# Patient Record
Sex: Male | Born: 1945 | Race: Black or African American | Hispanic: No | Smoking: Former smoker
Health system: Southern US, Community
[De-identification: ages and names within clinical notes are randomized; demographics above are authoritative.]

## PROBLEM LIST (undated history)

## (undated) DIAGNOSIS — F329 Major depressive disorder, single episode, unspecified: Secondary | ICD-10-CM

## (undated) DIAGNOSIS — E785 Hyperlipidemia, unspecified: Secondary | ICD-10-CM

## (undated) DIAGNOSIS — E119 Type 2 diabetes mellitus without complications: Secondary | ICD-10-CM

## (undated) DIAGNOSIS — J449 Chronic obstructive pulmonary disease, unspecified: Secondary | ICD-10-CM

## (undated) DIAGNOSIS — I4891 Unspecified atrial fibrillation: Secondary | ICD-10-CM

## (undated) DIAGNOSIS — F32A Depression, unspecified: Secondary | ICD-10-CM

## (undated) DIAGNOSIS — J189 Pneumonia, unspecified organism: Secondary | ICD-10-CM

## (undated) HISTORY — PX: BACK SURGERY: SHX140

---
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
## 2018-11-10 ENCOUNTER — Other Ambulatory Visit (HOSPITAL_COMMUNITY): Payer: Medicare Other

## 2018-11-10 ENCOUNTER — Inpatient Hospital Stay
Admission: AD | Admit: 2018-11-10 | Discharge: 2018-11-29 | Disposition: A | Discharge status: Skilled Nursing Facility | Payer: Medicare Other | Source: Other Acute Inpatient Hospital | Attending: Internal Medicine | Admitting: Internal Medicine

## 2018-11-10 DIAGNOSIS — J189 Pneumonia, unspecified organism: Secondary | ICD-10-CM

## 2018-11-10 DIAGNOSIS — J90 Pleural effusion, not elsewhere classified: Secondary | ICD-10-CM

## 2018-11-10 DIAGNOSIS — R0602 Shortness of breath: Secondary | ICD-10-CM

## 2018-11-10 DIAGNOSIS — R0902 Hypoxemia: Secondary | ICD-10-CM

## 2018-11-10 MED ORDER — SERTRALINE HCL 50 MG PO TABS
50.00 | ORAL_TABLET | ORAL | Status: DC
Start: 2018-11-11 — End: 2018-11-10

## 2018-11-10 MED ORDER — GENERIC EXTERNAL MEDICATION
12.00 | Status: DC
Start: ? — End: 2018-11-10

## 2018-11-10 MED ORDER — DOCUSATE SODIUM 100 MG PO CAPS
100.00 | ORAL_CAPSULE | ORAL | Status: DC
Start: ? — End: 2018-11-10

## 2018-11-10 MED ORDER — ONDANSETRON HCL 4 MG/2ML IJ SOLN
4.00 | INTRAMUSCULAR | Status: DC
Start: ? — End: 2018-11-10

## 2018-11-10 MED ORDER — PREDNISONE 20 MG PO TABS
40.00 | ORAL_TABLET | ORAL | Status: DC
Start: 2018-11-11 — End: 2018-11-10

## 2018-11-10 MED ORDER — MAGNESIUM HYDROXIDE 400 MG/5ML PO SUSP
30.00 | ORAL | Status: DC
Start: ? — End: 2018-11-10

## 2018-11-10 MED ORDER — LEVOFLOXACIN 750 MG PO TABS
750.00 | ORAL_TABLET | ORAL | Status: DC
Start: 2018-11-10 — End: 2018-11-10

## 2018-11-10 MED ORDER — WHITE PETROLEUM JELLY EX GEL
1.00 | CUTANEOUS | Status: DC
Start: ? — End: 2018-11-10

## 2018-11-10 MED ORDER — BISACODYL 10 MG RE SUPP
10.00 | RECTAL | Status: DC
Start: ? — End: 2018-11-10

## 2018-11-10 MED ORDER — DILTIAZEM HCL ER BEADS 180 MG PO CP24
180.00 | ORAL_CAPSULE | ORAL | Status: DC
Start: 2018-11-11 — End: 2018-11-10

## 2018-11-10 MED ORDER — DEXTROSE 50 % IV SOLN
50.00 | INTRAVENOUS | Status: DC
Start: ? — End: 2018-11-10

## 2018-11-10 MED ORDER — GLUCOSE 40 % PO GEL
ORAL | Status: DC
Start: ? — End: 2018-11-10

## 2018-11-10 MED ORDER — IPRATROPIUM-ALBUTEROL 0.5-2.5 (3) MG/3ML IN SOLN
3.00 | RESPIRATORY_TRACT | Status: DC
Start: 2018-11-10 — End: 2018-11-10

## 2018-11-10 MED ORDER — GLUCAGON HCL (DIAGNOSTIC) 1 MG IJ SOLR
1.00 | INTRAMUSCULAR | Status: DC
Start: ? — End: 2018-11-10

## 2018-11-10 MED ORDER — GUAIFENESIN ER 600 MG PO TB12
1200.00 | ORAL_TABLET | ORAL | Status: DC
Start: 2018-11-10 — End: 2018-11-10

## 2018-11-10 MED ORDER — APIXABAN 5 MG PO TABS
5.00 | ORAL_TABLET | ORAL | Status: DC
Start: 2018-11-10 — End: 2018-11-10

## 2018-11-10 MED ORDER — INSULIN GLARGINE 100 UNIT/ML ~~LOC~~ SOLN
30.00 | SUBCUTANEOUS | Status: DC
Start: 2018-11-10 — End: 2018-11-10

## 2018-11-10 MED ORDER — INSULIN LISPRO 100 UNIT/ML ~~LOC~~ SOLN
0.00 | SUBCUTANEOUS | Status: DC
Start: 2018-11-10 — End: 2018-11-10

## 2018-11-10 MED ORDER — SODIUM CHLORIDE 3 % IN NEBU
4.00 | INHALATION_SOLUTION | RESPIRATORY_TRACT | Status: DC
Start: 2018-11-10 — End: 2018-11-10

## 2018-11-10 MED ORDER — HYDROXYZINE HCL 25 MG PO TABS
25.00 | ORAL_TABLET | ORAL | Status: DC
Start: ? — End: 2018-11-10

## 2018-11-10 MED ORDER — ATORVASTATIN CALCIUM 40 MG PO TABS
40.00 | ORAL_TABLET | ORAL | Status: DC
Start: 2018-11-10 — End: 2018-11-10

## 2018-11-10 MED ORDER — ACETAMINOPHEN 325 MG PO TABS
650.00 | ORAL_TABLET | ORAL | Status: DC
Start: ? — End: 2018-11-10

## 2018-11-10 MED ORDER — HYDRALAZINE HCL 20 MG/ML IJ SOLN
10.00 | INTRAMUSCULAR | Status: DC
Start: ? — End: 2018-11-10

## 2018-11-10 MED ORDER — ALBUTEROL SULFATE (2.5 MG/3ML) 0.083% IN NEBU
2.50 | INHALATION_SOLUTION | RESPIRATORY_TRACT | Status: DC
Start: ? — End: 2018-11-10

## 2018-11-10 MED ORDER — PANTOPRAZOLE SODIUM 40 MG PO TBEC
40.00 | DELAYED_RELEASE_TABLET | ORAL | Status: DC
Start: 2018-11-11 — End: 2018-11-10

## 2018-11-10 MED ORDER — SALINE NASAL SPRAY 0.65 % NA SOLN
1.00 | NASAL | Status: DC
Start: ? — End: 2018-11-10

## 2018-11-10 MED ORDER — NITROGLYCERIN 0.4 MG SL SUBL
.40 | SUBLINGUAL_TABLET | SUBLINGUAL | Status: DC
Start: ? — End: 2018-11-10

## 2018-11-11 LAB — CBC WITH DIFFERENTIAL/PLATELET
Abs Immature Granulocytes: 0 10*3/uL (ref 0.00–0.07)
Basophils Absolute: 0 10*3/uL (ref 0.0–0.1)
Basophils Relative: 0 %
Eosinophils Absolute: 0 10*3/uL (ref 0.0–0.5)
Eosinophils Relative: 0 %
HCT: 31.7 % — ABNORMAL LOW (ref 39.0–52.0)
Hemoglobin: 9 g/dL — ABNORMAL LOW (ref 13.0–17.0)
LYMPHS ABS: 1.3 10*3/uL (ref 0.7–4.0)
Lymphocytes Relative: 16 %
MCH: 28.5 pg (ref 26.0–34.0)
MCHC: 28.4 g/dL — ABNORMAL LOW (ref 30.0–36.0)
MCV: 100.3 fL — ABNORMAL HIGH (ref 80.0–100.0)
Monocytes Absolute: 0.4 10*3/uL (ref 0.1–1.0)
Monocytes Relative: 5 %
Neutro Abs: 6.2 10*3/uL (ref 1.7–7.7)
Neutrophils Relative %: 79 %
PLATELETS: 176 10*3/uL (ref 150–400)
RBC: 3.16 MIL/uL — ABNORMAL LOW (ref 4.22–5.81)
RDW: 14.6 % (ref 11.5–15.5)
WBC: 7.9 10*3/uL (ref 4.0–10.5)
nRBC: 0 % (ref 0.0–0.2)
nRBC: 0 /100 WBC

## 2018-11-11 LAB — COMPREHENSIVE METABOLIC PANEL
ALT: 18 U/L (ref 0–44)
AST: 11 U/L — AB (ref 15–41)
Albumin: 2.6 g/dL — ABNORMAL LOW (ref 3.5–5.0)
Alkaline Phosphatase: 37 U/L — ABNORMAL LOW (ref 38–126)
Anion gap: 7 (ref 5–15)
BUN: 18 mg/dL (ref 8–23)
CHLORIDE: 93 mmol/L — AB (ref 98–111)
CO2: 43 mmol/L — AB (ref 22–32)
Calcium: 9.7 mg/dL (ref 8.9–10.3)
Creatinine, Ser: 0.5 mg/dL — ABNORMAL LOW (ref 0.61–1.24)
GFR calc Af Amer: >60 mL/min (ref >60)
GFR calc non Af Amer: >60 mL/min (ref >60)
Glucose, Bld: 164 mg/dL — ABNORMAL HIGH (ref 70–99)
Potassium: 4.7 mmol/L (ref 3.5–5.1)
Sodium: 143 mmol/L (ref 135–145)
Total Bilirubin: 0.4 mg/dL (ref 0.3–1.2)
Total Protein: 5.5 g/dL — ABNORMAL LOW (ref 6.5–8.1)

## 2018-11-11 LAB — HEMOGLOBIN A1C
Hgb A1c MFr Bld: 9.1 % — ABNORMAL HIGH (ref 4.8–5.6)
Mean Plasma Glucose: 214.47 mg/dL

## 2018-11-12 ENCOUNTER — Other Ambulatory Visit (HOSPITAL_COMMUNITY): Payer: Medicare Other

## 2018-11-17 LAB — BASIC METABOLIC PANEL
Anion gap: 8 (ref 5–15)
BUN: 17 mg/dL (ref 8–23)
CO2: 42 mmol/L — ABNORMAL HIGH (ref 22–32)
CREATININE: 0.58 mg/dL — AB (ref 0.61–1.24)
Calcium: 9.2 mg/dL (ref 8.9–10.3)
Chloride: 91 mmol/L — ABNORMAL LOW (ref 98–111)
GFR calc Af Amer: >60 mL/min (ref >60)
GFR calc non Af Amer: >60 mL/min (ref >60)
Glucose, Bld: 81 mg/dL (ref 70–99)
Potassium: 4 mmol/L (ref 3.5–5.1)
Sodium: 141 mmol/L (ref 135–145)

## 2018-11-17 LAB — CBC
HCT: 29.3 % — ABNORMAL LOW (ref 39.0–52.0)
Hemoglobin: 8.6 g/dL — ABNORMAL LOW (ref 13.0–17.0)
MCH: 29 pg (ref 26.0–34.0)
MCHC: 29.4 g/dL — AB (ref 30.0–36.0)
MCV: 98.7 fL (ref 80.0–100.0)
Platelets: 158 10*3/uL (ref 150–400)
RBC: 2.97 MIL/uL — ABNORMAL LOW (ref 4.22–5.81)
RDW: 15.7 % — AB (ref 11.5–15.5)
WBC: 7.7 10*3/uL (ref 4.0–10.5)
nRBC: 0.5 % — ABNORMAL HIGH (ref 0.0–0.2)

## 2018-11-26 ENCOUNTER — Other Ambulatory Visit (HOSPITAL_COMMUNITY): Payer: Medicare Other

## 2018-11-26 LAB — BASIC METABOLIC PANEL
Anion gap: 7 (ref 5–15)
BUN: 15 mg/dL (ref 8–23)
CO2: 37 mmol/L — ABNORMAL HIGH (ref 22–32)
Calcium: 9.4 mg/dL (ref 8.9–10.3)
Chloride: 95 mmol/L — ABNORMAL LOW (ref 98–111)
Creatinine, Ser: 0.65 mg/dL (ref 0.61–1.24)
GFR calc Af Amer: >60 mL/min (ref >60)
Glucose, Bld: 277 mg/dL — ABNORMAL HIGH (ref 70–99)
Potassium: 4.4 mmol/L (ref 3.5–5.1)
Sodium: 139 mmol/L (ref 135–145)

## 2018-11-26 LAB — CBC
HCT: 35.7 % — ABNORMAL LOW (ref 39.0–52.0)
HCT: 35 % — ABNORMAL LOW (ref 39.0–52.0)
Hemoglobin: 10.2 g/dL — ABNORMAL LOW (ref 13.0–17.0)
Hemoglobin: 10.1 g/dL — ABNORMAL LOW (ref 13.0–17.0)
MCH: 28.3 pg (ref 26.0–34.0)
MCH: 28.4 pg (ref 26.0–34.0)
MCHC: 28.6 g/dL — ABNORMAL LOW (ref 30.0–36.0)
MCHC: 28.9 g/dL — ABNORMAL LOW (ref 30.0–36.0)
MCV: 98.9 fL (ref 80.0–100.0)
MCV: 98.3 fL (ref 80.0–100.0)
Platelets: 126 K/uL — ABNORMAL LOW (ref 150–400)
Platelets: 127 K/uL — ABNORMAL LOW (ref 150–400)
RBC: 3.61 MIL/uL — ABNORMAL LOW (ref 4.22–5.81)
RBC: 3.56 MIL/uL — ABNORMAL LOW (ref 4.22–5.81)
RDW: 16.3 % — ABNORMAL HIGH (ref 11.5–15.5)
RDW: 16.4 % — ABNORMAL HIGH (ref 11.5–15.5)
WBC: 11.8 K/uL — ABNORMAL HIGH (ref 4.0–10.5)
WBC: 10.9 K/uL — ABNORMAL HIGH (ref 4.0–10.5)
nRBC: 0.2 % (ref 0.0–0.2)
nRBC: 0.4 % — ABNORMAL HIGH (ref 0.0–0.2)

## 2018-11-29 LAB — CULTURE, RESPIRATORY

## 2018-11-29 LAB — CULTURE, RESPIRATORY W GRAM STAIN: Culture: NORMAL

## 2018-12-25 ENCOUNTER — Inpatient Hospital Stay (HOSPITAL_COMMUNITY)
Admission: EM | Admit: 2018-12-25 | Discharge: 2019-01-29 | DRG: 871 | Disposition: E | Payer: Medicare Other | Attending: Internal Medicine | Admitting: Internal Medicine

## 2018-12-25 ENCOUNTER — Emergency Department (HOSPITAL_COMMUNITY): Payer: Medicare Other

## 2018-12-25 ENCOUNTER — Encounter (HOSPITAL_COMMUNITY): Payer: Self-pay

## 2018-12-25 DIAGNOSIS — Z515 Encounter for palliative care: Secondary | ICD-10-CM | POA: Diagnosis not present

## 2018-12-25 DIAGNOSIS — R945 Abnormal results of liver function studies: Secondary | ICD-10-CM | POA: Diagnosis not present

## 2018-12-25 DIAGNOSIS — Z681 Body mass index (BMI) 19 or less, adult: Secondary | ICD-10-CM

## 2018-12-25 DIAGNOSIS — G9341 Metabolic encephalopathy: Secondary | ICD-10-CM | POA: Diagnosis present

## 2018-12-25 DIAGNOSIS — J440 Chronic obstructive pulmonary disease with acute lower respiratory infection: Secondary | ICD-10-CM | POA: Diagnosis present

## 2018-12-25 DIAGNOSIS — I5032 Chronic diastolic (congestive) heart failure: Secondary | ICD-10-CM | POA: Diagnosis present

## 2018-12-25 DIAGNOSIS — Z887 Allergy status to serum and vaccine status: Secondary | ICD-10-CM

## 2018-12-25 DIAGNOSIS — E1165 Type 2 diabetes mellitus with hyperglycemia: Secondary | ICD-10-CM | POA: Diagnosis not present

## 2018-12-25 DIAGNOSIS — E118 Type 2 diabetes mellitus with unspecified complications: Secondary | ICD-10-CM

## 2018-12-25 DIAGNOSIS — R54 Age-related physical debility: Secondary | ICD-10-CM | POA: Diagnosis present

## 2018-12-25 DIAGNOSIS — J189 Pneumonia, unspecified organism: Secondary | ICD-10-CM | POA: Diagnosis present

## 2018-12-25 DIAGNOSIS — J9621 Acute and chronic respiratory failure with hypoxia: Secondary | ICD-10-CM | POA: Diagnosis not present

## 2018-12-25 DIAGNOSIS — I480 Paroxysmal atrial fibrillation: Secondary | ICD-10-CM | POA: Diagnosis present

## 2018-12-25 DIAGNOSIS — J449 Chronic obstructive pulmonary disease, unspecified: Secondary | ICD-10-CM | POA: Diagnosis present

## 2018-12-25 DIAGNOSIS — E7800 Pure hypercholesterolemia, unspecified: Secondary | ICD-10-CM

## 2018-12-25 DIAGNOSIS — R7989 Other specified abnormal findings of blood chemistry: Secondary | ICD-10-CM | POA: Diagnosis present

## 2018-12-25 DIAGNOSIS — Z66 Do not resuscitate: Secondary | ICD-10-CM | POA: Diagnosis present

## 2018-12-25 DIAGNOSIS — R778 Other specified abnormalities of plasma proteins: Secondary | ICD-10-CM | POA: Diagnosis present

## 2018-12-25 DIAGNOSIS — E875 Hyperkalemia: Secondary | ICD-10-CM | POA: Diagnosis present

## 2018-12-25 DIAGNOSIS — J9622 Acute and chronic respiratory failure with hypercapnia: Secondary | ICD-10-CM | POA: Diagnosis present

## 2018-12-25 DIAGNOSIS — R0600 Dyspnea, unspecified: Secondary | ICD-10-CM

## 2018-12-25 DIAGNOSIS — R627 Adult failure to thrive: Secondary | ICD-10-CM | POA: Diagnosis not present

## 2018-12-25 DIAGNOSIS — Z794 Long term (current) use of insulin: Secondary | ICD-10-CM

## 2018-12-25 DIAGNOSIS — R6520 Severe sepsis without septic shock: Secondary | ICD-10-CM | POA: Diagnosis present

## 2018-12-25 DIAGNOSIS — Z20828 Contact with and (suspected) exposure to other viral communicable diseases: Secondary | ICD-10-CM | POA: Diagnosis present

## 2018-12-25 DIAGNOSIS — A419 Sepsis, unspecified organism: Secondary | ICD-10-CM | POA: Diagnosis not present

## 2018-12-25 DIAGNOSIS — J410 Simple chronic bronchitis: Secondary | ICD-10-CM

## 2018-12-25 DIAGNOSIS — Y95 Nosocomial condition: Secondary | ICD-10-CM | POA: Diagnosis present

## 2018-12-25 DIAGNOSIS — R0603 Acute respiratory distress: Secondary | ICD-10-CM | POA: Diagnosis not present

## 2018-12-25 DIAGNOSIS — I248 Other forms of acute ischemic heart disease: Secondary | ICD-10-CM | POA: Diagnosis present

## 2018-12-25 DIAGNOSIS — E874 Mixed disorder of acid-base balance: Secondary | ICD-10-CM | POA: Diagnosis present

## 2018-12-25 DIAGNOSIS — E785 Hyperlipidemia, unspecified: Secondary | ICD-10-CM | POA: Diagnosis present

## 2018-12-25 DIAGNOSIS — I4891 Unspecified atrial fibrillation: Secondary | ICD-10-CM | POA: Diagnosis present

## 2018-12-25 DIAGNOSIS — D539 Nutritional anemia, unspecified: Secondary | ICD-10-CM | POA: Diagnosis present

## 2018-12-25 DIAGNOSIS — J9602 Acute respiratory failure with hypercapnia: Secondary | ICD-10-CM

## 2018-12-25 DIAGNOSIS — Z87891 Personal history of nicotine dependence: Secondary | ICD-10-CM

## 2018-12-25 DIAGNOSIS — F329 Major depressive disorder, single episode, unspecified: Secondary | ICD-10-CM | POA: Diagnosis present

## 2018-12-25 DIAGNOSIS — D696 Thrombocytopenia, unspecified: Secondary | ICD-10-CM | POA: Diagnosis not present

## 2018-12-25 DIAGNOSIS — D509 Iron deficiency anemia, unspecified: Secondary | ICD-10-CM | POA: Diagnosis present

## 2018-12-25 DIAGNOSIS — F419 Anxiety disorder, unspecified: Secondary | ICD-10-CM | POA: Diagnosis present

## 2018-12-25 DIAGNOSIS — R0602 Shortness of breath: Secondary | ICD-10-CM

## 2018-12-25 DIAGNOSIS — J441 Chronic obstructive pulmonary disease with (acute) exacerbation: Secondary | ICD-10-CM | POA: Diagnosis present

## 2018-12-25 DIAGNOSIS — F32A Depression, unspecified: Secondary | ICD-10-CM | POA: Diagnosis present

## 2018-12-25 DIAGNOSIS — Z9981 Dependence on supplemental oxygen: Secondary | ICD-10-CM

## 2018-12-25 DIAGNOSIS — Z79899 Other long term (current) drug therapy: Secondary | ICD-10-CM

## 2018-12-25 DIAGNOSIS — Z7901 Long term (current) use of anticoagulants: Secondary | ICD-10-CM

## 2018-12-25 DIAGNOSIS — E119 Type 2 diabetes mellitus without complications: Secondary | ICD-10-CM

## 2018-12-25 DIAGNOSIS — Z888 Allergy status to other drugs, medicaments and biological substances status: Secondary | ICD-10-CM

## 2018-12-25 DIAGNOSIS — IMO0002 Reserved for concepts with insufficient information to code with codable children: Secondary | ICD-10-CM | POA: Diagnosis present

## 2018-12-25 DIAGNOSIS — Z7952 Long term (current) use of systemic steroids: Secondary | ICD-10-CM

## 2018-12-25 HISTORY — DX: Chronic obstructive pulmonary disease, unspecified: J44.9

## 2018-12-25 HISTORY — DX: Hyperlipidemia, unspecified: E78.5

## 2018-12-25 HISTORY — DX: Depression, unspecified: F32.A

## 2018-12-25 HISTORY — DX: Unspecified atrial fibrillation: I48.91

## 2018-12-25 HISTORY — DX: Type 2 diabetes mellitus without complications: E11.9

## 2018-12-25 HISTORY — DX: Major depressive disorder, single episode, unspecified: F32.9

## 2018-12-25 LAB — BLOOD GAS, ARTERIAL
Acid-Base Excess: 9.4 mmol/L — ABNORMAL HIGH (ref 0.0–2.0)
Bicarbonate: 37.9 mmol/L — ABNORMAL HIGH (ref 20.0–28.0)
Drawn by: 252031
O2 Content: 5 L/min
O2 Saturation: 97.9 %
Patient temperature: 96.7
pCO2 arterial: 103 mmHg (ref 32.0–48.0)
pH, Arterial: 7.182 — CL (ref 7.350–7.450)
pO2, Arterial: 142 mmHg — ABNORMAL HIGH (ref 83.0–108.0)

## 2018-12-25 LAB — CBC WITH DIFFERENTIAL/PLATELET
Abs Immature Granulocytes: 0.08 10*3/uL — ABNORMAL HIGH (ref 0.00–0.07)
Basophils Absolute: 0 10*3/uL (ref 0.0–0.1)
Basophils Relative: 0 %
Eosinophils Absolute: 0 10*3/uL (ref 0.0–0.5)
Eosinophils Relative: 0 %
HCT: 34.1 % — ABNORMAL LOW (ref 39.0–52.0)
Hemoglobin: 9.6 g/dL — ABNORMAL LOW (ref 13.0–17.0)
Immature Granulocytes: 1 %
Lymphocytes Relative: 5 %
Lymphs Abs: 0.6 10*3/uL — ABNORMAL LOW (ref 0.7–4.0)
MCH: 28.6 pg (ref 26.0–34.0)
MCHC: 28.2 g/dL — ABNORMAL LOW (ref 30.0–36.0)
MCV: 101.5 fL — ABNORMAL HIGH (ref 80.0–100.0)
Monocytes Absolute: 0.7 10*3/uL (ref 0.1–1.0)
Monocytes Relative: 6 %
Neutro Abs: 10.8 10*3/uL — ABNORMAL HIGH (ref 1.7–7.7)
Neutrophils Relative %: 88 %
Platelets: 250 10*3/uL (ref 150–400)
RBC: 3.36 MIL/uL — ABNORMAL LOW (ref 4.22–5.81)
RDW: 15 % (ref 11.5–15.5)
WBC: 12.3 10*3/uL — ABNORMAL HIGH (ref 4.0–10.5)
nRBC: 0.2 % (ref 0.0–0.2)

## 2018-12-25 LAB — LACTIC ACID, PLASMA: Lactic Acid, Venous: 4.9 mmol/L (ref 0.5–1.9)

## 2018-12-25 LAB — COMPREHENSIVE METABOLIC PANEL
ALT: 110 U/L — ABNORMAL HIGH (ref 0–44)
AST: 137 U/L — ABNORMAL HIGH (ref 15–41)
Albumin: 2.5 g/dL — ABNORMAL LOW (ref 3.5–5.0)
Alkaline Phosphatase: 61 U/L (ref 38–126)
Anion gap: 14 (ref 5–15)
BUN: 18 mg/dL (ref 8–23)
CO2: 39 mmol/L — ABNORMAL HIGH (ref 22–32)
Calcium: 9.8 mg/dL (ref 8.9–10.3)
Chloride: 87 mmol/L — ABNORMAL LOW (ref 98–111)
Creatinine, Ser: 0.89 mg/dL (ref 0.61–1.24)
GFR calc Af Amer: >60 mL/min (ref >60)
GFR calc non Af Amer: >60 mL/min (ref >60)
Glucose, Bld: 346 mg/dL — ABNORMAL HIGH (ref 70–99)
Potassium: 5.2 mmol/L — ABNORMAL HIGH (ref 3.5–5.1)
Sodium: 140 mmol/L (ref 135–145)
Total Bilirubin: 0.8 mg/dL (ref 0.3–1.2)
Total Protein: 6.6 g/dL (ref 6.5–8.1)

## 2018-12-25 LAB — URINALYSIS, ROUTINE W REFLEX MICROSCOPIC
Bilirubin Urine: NEGATIVE
Glucose, UA: >=500 mg/dL — AB
Hgb urine dipstick: NEGATIVE
Ketones, ur: NEGATIVE mg/dL
Leukocytes,Ua: NEGATIVE
Nitrite: NEGATIVE
Protein, ur: 100 mg/dL — AB
Specific Gravity, Urine: 1.02 (ref 1.005–1.030)
pH: 5 (ref 5.0–8.0)

## 2018-12-25 LAB — SARS CORONAVIRUS 2 BY RT PCR (HOSPITAL ORDER, PERFORMED IN ~~LOC~~ HOSPITAL LAB): SARS Coronavirus 2: NEGATIVE

## 2018-12-25 LAB — TROPONIN I: Troponin I: 0.03 ng/mL (ref <0.03)

## 2018-12-25 MED ORDER — SODIUM CHLORIDE 0.9 % IV BOLUS (SEPSIS)
500.0000 mL | Freq: Once | INTRAVENOUS | Status: AC
  Administered 2018-12-25: 21:00:00 500 mL via INTRAVENOUS

## 2018-12-25 MED ORDER — PIPERACILLIN-TAZOBACTAM 3.375 G IVPB 30 MIN
3.3750 g | Freq: Once | INTRAVENOUS | Status: AC
  Administered 2018-12-25: 3.375 g via INTRAVENOUS
  Filled 2018-12-25: qty 50

## 2018-12-25 MED ORDER — MAGNESIUM SULFATE 2 GM/50ML IV SOLN
2.0000 g | Freq: Once | INTRAVENOUS | Status: AC
  Administered 2018-12-25: 21:00:00 2 g via INTRAVENOUS
  Filled 2018-12-25: qty 50

## 2018-12-25 MED ORDER — METHYLPREDNISOLONE SODIUM SUCC 125 MG IJ SOLR
125.0000 mg | Freq: Once | INTRAMUSCULAR | Status: AC
  Administered 2018-12-25: 21:00:00 125 mg via INTRAVENOUS
  Filled 2018-12-25: qty 2

## 2018-12-25 MED ORDER — ALBUTEROL (5 MG/ML) CONTINUOUS INHALATION SOLN
10.0000 mg/h | INHALATION_SOLUTION | RESPIRATORY_TRACT | Status: DC
  Administered 2018-12-25: 23:00:00 10 mg/h via RESPIRATORY_TRACT
  Filled 2018-12-25: qty 20

## 2018-12-25 MED ORDER — SODIUM CHLORIDE 0.9 % IV BOLUS (SEPSIS)
1000.0000 mL | Freq: Once | INTRAVENOUS | Status: AC
  Administered 2018-12-25: 1000 mL via INTRAVENOUS

## 2018-12-25 MED ORDER — VANCOMYCIN HCL IN DEXTROSE 1-5 GM/200ML-% IV SOLN
1000.0000 mg | Freq: Once | INTRAVENOUS | Status: AC
  Administered 2018-12-25: 1000 mg via INTRAVENOUS
  Filled 2018-12-25: qty 200

## 2018-12-25 MED ORDER — SODIUM CHLORIDE 0.9 % IV BOLUS (SEPSIS)
250.0000 mL | Freq: Once | INTRAVENOUS | Status: AC
  Administered 2018-12-25: 250 mL via INTRAVENOUS

## 2018-12-25 NOTE — H&P (Signed)
History and Physical    Gregory Stone RUE:[id number] DOB: 1946-07-15 DOA: 12/19/2018  Referring MD/NP/PA:   PCP: Patient, No Pcp Per   Patient coming from:  The patient is coming from SNF.  At baseline, pt is dependent for most of ADL.        Chief Complaint: SOB and AMS  HPI: Gregory Stone is a 73 y.o. male with medical history significant of hyperlipidemia, diabetes mellitus, COPD, atrial fibrillation on Eliquis, depression, who presents with altered mental status and shortness of breath.  Pt has AMS and is unable to provide any medical history, therefore, most of the history is obtained by discussing the case with ED physician, per EMS report, and with the nursing staff.  Per report, pt developed shortness of breath in the facility, found to have oxygen desaturation to 50%. Pt was given breathing treatment and also treated with a dose of Ativan, then patient became minimally responsive. When I saw pt in ED, he is barely arousable, seems to move extremities upon painful stimuli.  Patient has respiratory distress, but not actively coughing.  No active nausea, vomiting or diarrhea noted.  Not sure if patient has any pain anywhere.  ED Course: pt was found to have negative COVID19 test, negative Flu PCR, WBC 12.3, lactic acid of 4.9, troponin 0 0.03, urinalysis (cloudy appearance, rare bacteria, WBC 11-20), potassium 5.2, creatinine 0.89, BUN 18, abnormal liver function (ALP 61, AST 137, ALT 110, total bilirubin 0.8), ammonia level 27, temperature normal, atrial fibrillation with RVR, tachypnea, oxygen saturation 100% on 5 L nasal cannula oxygen, ABG with pH 7.182, pCO2 103, PO2 142. Chest x-ray showed left upper lobe infiltration. CT head negative for acute intracranial abnormalities.  Patient is admitted to stepdown as inpatient.  Review of Systems: Could not be reviewed due to altered mental status.  Allergy:  Allergies  Allergen Reactions  . Lisinopril Swelling    Angioedema   .  Sulfamethoxazole-Trimethoprim Anaphylaxis and Other (See Comments)    Other reaction(s): Muscle Pain   . Ticagrelor Shortness Of Breath       . Trimethoprim Other (See Comments)    Per MAR    Past Medical History:  Diagnosis Date  . A-fib (HCC)   . COPD (chronic obstructive pulmonary disease) (HCC)   . Depression   . Diabetes mellitus without complication (HCC)   . HLD (hyperlipidemia)     History reviewed. No pertinent surgical history.  Social History:  has no history on file for tobacco, alcohol, and drug.  Family History: No family history on file.   Prior to Admission medications   Medication Sig Start Date End Date Taking? Authorizing Provider  apixaban (ELIQUIS) 5 MG TABS tablet Take 5 mg by mouth 2 (two) times daily.   Yes [provider]  atorvastatin (LIPITOR) 40 MG tablet Take 40 mg by mouth at bedtime.   Yes [provider]  guaiFENesin (MUCINEX) 600 MG 12 hr tablet Take 1,200 mg by mouth 2 (two) times daily.    Yes [provider]  Insulin Glargine (BASAGLAR KWIKPEN) 100 UNIT/ML SOPN Inject 10-30 Units into the skin See admin instructions. Inject 30 units subcutaneously daily at 9am, inject 10 units daily at 6pm   Yes [provider]  ipratropium-albuterol (DUONEB) 0.5-2.5 (3) MG/3ML SOLN Take 3 mLs by nebulization every 6 (six) hours.   Yes [provider]  Lactobacillus (ACIDOPHILUS PROBIOTIC) 100 MG CAPS Take 100 mg by mouth 2 (two) times a day.   Yes [provider]  LORazepam (ATIVAN) 0.5 MG tablet Take 0.5 mg by mouth every 12 (twelve) hours as needed for anxiety.   Yes [provider]  Multiple Vitamin (MULTIVITAMIN WITH MINERALS) TABS tablet Take 1 tablet by mouth daily.   Yes [provider]  Nutritional Supplements (PROMOD) LIQD Take 30 mLs by mouth 2 (two) times a day.   Yes [provider]  OXYGEN Inhale 3 L into the lungs continuous.   Yes [provider]   pantoprazole (PROTONIX) 40 MG tablet Take 40 mg by mouth daily at 6 (six) AM.   Yes [provider]  predniSONE (DELTASONE) 20 MG tablet Take 20 mg by mouth daily.   Yes [provider]  sertraline (ZOLOFT) 50 MG tablet Take 50 mg by mouth daily.   Yes [provider]  vitamin C (ASCORBIC ACID) 500 MG tablet Take 500 mg by mouth daily.   Yes [provider]  doxycycline (VIBRA-TABS) 100 MG tablet Take 100 mg by mouth 2 (two) times daily.    [provider]    Physical Exam: Vitals:   12/26/18 0100 12/26/18 0243 12/26/18 0330 12/26/18 0404  BP: 131/68     Pulse: 92  92   Resp: (!) 24  (!) 28   Temp: 98.5 F (36.9 C)   (!) 97.3 F (36.3 C)  TempSrc: Axillary   Axillary  SpO2: 97% 98% 100%   Weight: 61.7 kg      General: Not in acute distress HEENT:       Eyes: PERRL, EOMI, no scleral icterus.       ENT: No discharge from the ears and nose.       Neck: No JVD, no bruit, no mass felt. Heme: No neck lymph node enlargement. Cardiac: S1/S2, irregular irregular rhythm, no murmurs, No gallops or rubs. Respiratory: Decreased air movement bilaterally.  No rales, wheezing, rhonchi or rubs. GI: Soft, nondistended, no organomegaly, BS present. GU: No hematuria Ext: No pitting leg edema bilaterally. 2+DP/PT pulse bilaterally. Musculoskeletal: No joint deformities, No joint redness or warmth, no limitation of ROM in spin. Skin: No rashes.  Neuro: pt is barely arousable, not following command, moves extremities on painful stimuli.  Psych: Patient is not psychotic, no suicidal or hemocidal ideation.  Labs on Admission: I have personally reviewed following labs and imaging studies  CBC: Recent Labs  Lab 01-10-19 2102  WBC 12.3*  NEUTROABS 10.8*  HGB 9.6*  HCT 34.1*  MCV 101.5*  PLT 250   Basic Metabolic Panel: Recent Labs  Lab Jan 10, 2019 2102 12/26/18 0042  NA 140  --   K 5.2* 5.2*  CL 87*  --   CO2 39*  --   GLUCOSE 346*  --   BUN  18  --   CREATININE 0.89  --   CALCIUM 9.8  --    GFR: CrCl cannot be calculated (Unknown ideal weight.). Liver Function Tests: Recent Labs  Lab 10-Jan-2019 2102  AST 137*  ALT 110*  ALKPHOS 61  BILITOT 0.8  PROT 6.6  ALBUMIN 2.5*   No results for input(s): LIPASE, AMYLASE in the last 168 hours. Recent Labs  Lab 12/26/18 0042  AMMONIA 27   Coagulation Profile: No results for input(s): INR, PROTIME in the last 168 hours. Cardiac Enzymes: Recent Labs  Lab 01-10-2019 2102 12/26/18 0042  TROPONINI 0.03* 0.12*   BNP (last 3 results) No results for input(s): PROBNP in the last 8760 hours. HbA1C: Recent Labs    12/26/18  0320  HGBA1C 7.6*   CBG: No results for input(s): GLUCAP in the last 168 hours. Lipid Profile: Recent Labs    12/26/18 0320  CHOL 125  HDL 58  LDLCALC 58  TRIG 43  CHOLHDL 2.2   Thyroid Function Tests: No results for input(s): TSH, T4TOTAL, FREET4, T3FREE, THYROIDAB in the last 72 hours. Anemia Panel: No results for input(s): VITAMINB12, FOLATE, FERRITIN, TIBC, IRON, RETICCTPCT in the last 72 hours. Urine analysis:    Component Value Date/Time   COLORURINE AMBER (A) 01/17/19 2036   APPEARANCEUR CLOUDY (A) 17-Jan-2019 2036   LABSPEC 1.020 Jan 17, 2019 2036   PHURINE 5.0 01/17/19 2036   GLUCOSEU >=500 (A) 01-17-2019 2036   HGBUR NEGATIVE 2019/01/17 2036   BILIRUBINUR NEGATIVE 2019/01/17 2036   KETONESUR NEGATIVE 01-17-2019 2036   PROTEINUR 100 (A) 01-17-2019 2036   NITRITE NEGATIVE 01-17-2019 2036   LEUKOCYTESUR NEGATIVE Jan 17, 2019 2036   Sepsis Labs: (procalcitonin:4,lacticidven:4) ) Recent Results (from the past 240 hour(s))  SARS Coronavirus 2 [facility] order, Performed in [facility] hospital lab)     Status: None   Collection Time: Jan 17, 2019  8:45 PM  Result Value Ref Range Status   SARS Coronavirus 2 NEGATIVE NEGATIVE Final    Comment: (NOTE) If result is NEGATIVE SARS-CoV-2 target nucleic acids are NOT DETECTED. The  SARS-CoV-2 RNA is generally detectable in upper and lower  respiratory specimens during the acute phase of infection. The lowest  concentration of SARS-CoV-2 viral copies this assay can detect is 250  copies / mL. A negative result does not preclude SARS-CoV-2 infection  and should not be used as the sole basis for treatment or other  patient management decisions.  A negative result may occur with  improper specimen collection / handling, submission of specimen other  than nasopharyngeal swab, presence of viral mutation(s) within the  areas targeted by this assay, and inadequate number of viral copies  (<250 copies / mL). A negative result must be combined with clinical  observations, patient history, and epidemiological information. If result is POSITIVE SARS-CoV-2 target nucleic acids are DETECTED. The SARS-CoV-2 RNA is generally detectable in upper and lower  respiratory specimens dur ing the acute phase of infection.  Positive  results are indicative of active infection with SARS-CoV-2.  Clinical  correlation with patient history and other diagnostic information is  necessary to determine patient infection status.  Positive results do  not rule out bacterial infection or co-infection with other viruses. If result is PRESUMPTIVE POSTIVE SARS-CoV-2 nucleic acids MAY BE PRESENT.   A presumptive positive result was obtained on the submitted specimen  and confirmed on repeat testing.  While 2019 novel coronavirus  (SARS-CoV-2) nucleic acids may be present in the submitted sample  additional confirmatory testing may be necessary for epidemiological  and / or clinical management purposes  to differentiate between  SARS-CoV-2 and other Sarbecovirus currently known to infect humans.  If clinically indicated additional testing with an alternate test  methodology [phone]) is advised. The SARS-CoV-2 RNA is generally  detectable in upper and lower respiratory sp ecimens during the acute   phase of infection. The expected result is Negative. Fact Sheet for Patients:  BoilerBrush.com.cy Fact Sheet for Healthcare Providers: https://pope.com/ This test is not yet approved or cleared by the [place] FDA and has been authorized for detection and/or diagnosis of SARS-CoV-2 by FDA under an Emergency Use Authorization (EUA).  This EUA will remain in effect (meaning this test can be used) for the duration of the  COVID-19 declaration under Section 564(b)(1) of the Act, 21 U.S.C. section 360bbb-3(b)(1), unless the authorization is terminated or revoked sooner. Performed at [facility], [address], [place], [place] [address]      Radiological Exams on Admission: Ct Head Wo Contrast  Result Date: 12/26/2018 CLINICAL DATA:  73 year old male with ataxia, encephalopathy, atrial fibrillation. EXAM: CT HEAD WITHOUT CONTRAST TECHNIQUE: Contiguous axial images were obtained from the base of the skull through the vertex without intravenous contrast. COMPARISON:  None. FINDINGS: Brain: Cerebral volume is within normal limits for age. No midline shift, ventriculomegaly, mass effect, evidence of mass lesion, intracranial hemorrhage or evidence of cortically based acute infarction. Patchy periatrial white matter hypodensity. No cortical encephalomalacia. Vascular: Calcified atherosclerosis at the skull base. No suspicious intracranial vascular hyperdensity. Skull: Negative. Sinuses/Orbits: Visualized paranasal sinuses and mastoids are well pneumatized. Other: Negative orbits. Visualized scalp soft tissues are within normal limits. IMPRESSION: 1. No acute intracranial abnormality. 2. Mild for age nonspecific cerebral white matter changes, most commonly due to small vessel disease. Electronically Signed   By: Odessa Fleming M.D.   On: 12/26/2018 01:49   Dg Chest Port 1 View  Result Date: 12/18/2018 CLINICAL DATA:  73 year old male with sepsis.  EXAM: PORTABLE CHEST 1 VIEW COMPARISON:  11/26/2018 and earlier. FINDINGS: Portable AP supine view at 2057 hours. Stable large lung volumes. Stable cardiac size and mediastinal contours. Visualized tracheal air column is within normal limits. No pneumothorax, pleural effusion or consolidation. New asymmetric reticulonodular opacity in the left upper lung. No acute osseous abnormality identified. IMPRESSION: New left upper lobe reticulonodular pulmonary opacity suspicious for acute viral/atypical respiratory infection. Underlying pulmonary hyperinflation. Electronically Signed   By: Odessa Fleming M.D.   On: 12/13/2018 22:02     EKG: Independently reviewed.  Atrial fibrillation with RVR, QTC 499, early R wave progression, nonspecific T wave change.   Assessment/Plan Principal Problem:   HCAP (healthcare-associated pneumonia) Active Problems:   HLD (hyperlipidemia)   Diabetes mellitus without complication (HCC)   Depression   Atrial fibrillation with RVR (HCC)   Acute on chronic respiratory failure with hypoxia (HCC)   Acute metabolic encephalopathy   Sepsis (HCC)   Hyperkalemia   Abnormal LFTs   Elevated troponin   COPD exacerbation (HCC)    Sepsis and acute on chronic respiratory failure with hypoxia and hypercapnia due to HCAP and COPD exacerbation: Chest x-ray showed new left upper lobe infiltration.  Patient meets criteria for sepsis with leukocytosis and tachycardia.  Lactic acid is elevated 4.9.  Currently hemodynamically stable. COVID19 test negative.  - will admit to SDU as inpt - IV Vancomycin, Zosyn, Aztreonam.  - Atrovent nebs and Xopenex Neb prn for SOB - Urine legionella and S. pneumococcal antigen - Follow up blood culture x2, sputum culture and respiratory virus panel - will get Procalcitonin and trend lactic acid level per sepsis protocol - IVF: total of 2.25 L NS was given, and followed by 75 mL per hour of NS  Acute metabolic encephalopathy: CT-head negative.  Likely  multifactorial etiology including hypercapnia, hypoxia and sepsis. -Frequent neuro check -Hold all oral medications until mental status improves.  HLD (hyperlipidemia): -hold lipitor  Diabetes mellitus without complication (HCC): Last A1c 9.1 on 11/11/18, poorly controled. Patient is taking glargine insulin at home -will decrease glargine insulin dose from 30-10 units bid to 20-7 units bid -SSI  Depression: -hold home oral meds  Atrial fibrillation with RVR (HCC): HR 143 initially improved to 90s currently. Likely due to  sepsis. CHA2DS2-VASc Score is 2, needs oral anticoagulation. Patient is on Eliquis at home.   -hold oral Eliquis until mental status improves  Hyperkalemia: mild. K=5.2. repeat K 5.2 with mild hemolysis. -Continue IV fluid, follow-up by BMP  Abnormal LFTs: Likely due to sepsis.  Ammonia level normal at 27. - Check hepatitis panel and HIV antibody -Avoid using liver toxic medications, such as Tylenol  Elevated troponin: trop 0.12.  Possibly due to demand ischemia secondary to sepsis and A. fib with RVR. - cycle CE q6 x3 and repeat EKG in the am  - Risk factor stratification: will check FLP and A1C - ASA per rectum    Inpatient status:  # Patient requires inpatient status due to high intensity of service, high risk for further deterioration and high frequency of surveillance required.  I certify that at the point of admission it is my clinical judgment that the patient will require inpatient hospital care spanning beyond 2 midnights from the point of admission.  . This patient has multiple chronic comorbidities including hyperlipidemia, diabetes mellitus, COPD, atrial fibrillation on Eliquis, depression . Now patient has presenting with sepsis and acute on chronic respiratory failure with hypoxia and hypercapnia, HCAP, COPD exacerbation, altered mental status.  Patient also has abnormal liver function and elevated troponin . The worrisome physical exam findings  include altered mental status . The initial radiographic and laboratory data are worrisome because of new left upper lobe infiltration on chest x-ray, sepsis, leukocytosis, positive troponin, elevated lactic acid, hyperkalemia with potassium of 5.2, abnormal liver function. . Current medical needs: please see my assessment and plan . Predictability of an adverse outcome (risk): Patient has multiple comorbidities, now presents with multiple complicated presentation as listed above.  His presentation is highly complex. Pt is at high risk for deteriorating.  Need to be treated in hospital for at least 2 days.     DVT ppx: SQ Lovenox Code Status: DNR (per EDP, Dr. Adela LankFloyd who called patient's daughter, who confirmed that the patient is DNR). Family Communication: None at bed side.     Disposition Plan:  Anticipate discharge back to previous SNF Consults called:  none Admission status:   SDU/inpation       Date of Service 12/26/2018    Lorretta HarpXilin Koehn Salehi [facility]   If 7PM-7AM, please contact night-coverage [email or web address] Password [email or web address] 12/26/2018, 6:51 AM

## 2018-12-25 NOTE — ED Triage Notes (Signed)
[facility], called out for being minimally responsive after a breathing treatment, nursing home then gave him some ativan. Pt in afib RVR, on non rebreather

## 2018-12-25 NOTE — Progress Notes (Signed)
Placed patient on bipap after SARS coronavirus test came back negative.

## 2018-12-25 NOTE — ED Notes (Signed)
Pt placed on 5L by respiratory

## 2018-12-25 NOTE — ED Provider Notes (Signed)
[facility]   CSN: [id number] Arrival date & time: 2019/08/06  2015    History   Chief Complaint Chief Complaint  Patient presents with  . Respiratory Distress    HPI Gregory OresJoshua Stone is a 73 y.o. male.     73 yo M with a chief complaints of respiratory distress.  Level 5 caveat acuity condition.  Per EMS the patient has had worsening shortness of breath this evening.  Was given Ativan for increased anxiety.  Upon their arrival he was significantly tachypneic and was on a nasal cannula.  Satting in the 50s.  Placed on a nonrebreather and brought to the emergency department.  The history is provided by the patient and the EMS personnel.  Illness  Severity:  Severe Onset quality:  Sudden Duration:  1 day Timing:  Constant Progression:  Worsening Chronicity:  New Associated symptoms: shortness of breath   Associated symptoms: no abdominal pain, no chest pain, no congestion, no diarrhea, no fever, no headaches, no myalgias, no rash and no vomiting     Past Medical History:  Diagnosis Date  . A-fib (HCC)     There are no active problems to display for this patient.   History reviewed. No pertinent surgical history.      Home Medications    Prior to Admission medications   Medication Sig Start Date End Date Taking? Authorizing Provider  apixaban (ELIQUIS) 5 MG TABS tablet Take 5 mg by mouth 2 (two) times daily.   Yes [provider]  atorvastatin (LIPITOR) 40 MG tablet Take 40 mg by mouth at bedtime.   Yes [provider]  guaiFENesin (MUCINEX) 600 MG 12 hr tablet Take 1,200 mg by mouth 2 (two) times daily.    Yes [provider]  Insulin Glargine (BASAGLAR KWIKPEN) 100 UNIT/ML SOPN Inject 10-30 Units into the skin See admin instructions. Inject 30 units subcutaneously daily at 9am, inject 10 units daily at 6pm   Yes [provider]  ipratropium-albuterol (DUONEB) 0.5-2.5 (3)  MG/3ML SOLN Take 3 mLs by nebulization every 6 (six) hours.   Yes [provider]  Lactobacillus (ACIDOPHILUS PROBIOTIC) 100 MG CAPS Take 100 mg by mouth 2 (two) times a day.   Yes [provider]  LORazepam (ATIVAN) 0.5 MG tablet Take 0.5 mg by mouth every 12 (twelve) hours as needed for anxiety.   Yes [provider]  Multiple Vitamin (MULTIVITAMIN WITH MINERALS) TABS tablet Take 1 tablet by mouth daily.   Yes [provider]  Nutritional Supplements (PROMOD) LIQD Take 30 mLs by mouth 2 (two) times a day.   Yes [provider]  OXYGEN Inhale 3 L into the lungs continuous.   Yes [provider]  pantoprazole (PROTONIX) 40 MG tablet Take 40 mg by mouth daily at 6 (six) AM.   Yes [provider]  predniSONE (DELTASONE) 20 MG tablet Take 20 mg by mouth daily.   Yes [provider]  sertraline (ZOLOFT) 50 MG tablet Take 50 mg by mouth daily.   Yes [provider]  vitamin C (ASCORBIC ACID) 500 MG tablet Take 500 mg by mouth daily.   Yes [provider]  doxycycline (VIBRA-TABS) 100 MG tablet Take 100 mg by mouth 2 (two) times daily.    [provider]    Family History No family history on file.  Social History Social History   Tobacco Use  . Smoking status: Not on file  Substance Use  Topics  . Alcohol use: Not on file  . Drug use: Not on file     Allergies   Lisinopril; Sulfamethoxazole-trimethoprim; Ticagrelor; and Trimethoprim   Review of Systems Review of Systems  Unable to perform ROS: Acuity of condition  Constitutional: Negative for chills and fever.  HENT: Negative for congestion and facial swelling.   Eyes: Negative for discharge and visual disturbance.  Respiratory: Positive for shortness of breath.   Cardiovascular: Negative for chest pain and palpitations.  Gastrointestinal: Negative for abdominal pain, diarrhea and vomiting.  Musculoskeletal: Negative for arthralgias  and myalgias.  Skin: Negative for color change and rash.  Neurological: Negative for tremors, syncope and headaches.  Psychiatric/Behavioral: Negative for confusion and dysphoric mood.     Physical Exam Updated Vital Signs BP 139/79   Pulse 94   Temp 98.6 F (37 C) (Rectal)   Resp (!) 25   SpO2 100%   Physical Exam Vitals signs and nursing note reviewed.  Constitutional:      Appearance: He is well-developed.     Comments: Cachectic  HENT:     Head: Normocephalic and atraumatic.  Eyes:     Pupils: Pupils are equal, round, and reactive to light.  Neck:     Musculoskeletal: Normal range of motion and neck supple.     Vascular: No JVD.  Cardiovascular:     Rate and Rhythm: Tachycardia present. Rhythm irregular.     Heart sounds: No murmur. No friction rub. No gallop.   Pulmonary:     Effort: Respiratory distress present.     Breath sounds: No wheezing.     Comments: Diminished breath sounds in all fields prolonged expiration Abdominal:     General: There is no distension.     Tenderness: There is no guarding or rebound.  Musculoskeletal: Normal range of motion.  Skin:    Coloration: Skin is not pale.     Findings: No rash.  Neurological:     Mental Status: He is alert and oriented to person, place, and time.  Psychiatric:        Behavior: Behavior normal.      ED Treatments / Results  Labs (all labs ordered are listed, but only abnormal results are displayed) Labs Reviewed  COMPREHENSIVE METABOLIC PANEL - Abnormal; Notable for the following components:      Result Value   Potassium 5.2 (*)    Chloride 87 (*)    CO2 39 (*)    Glucose, Bld 346 (*)    Albumin 2.5 (*)    AST 137 (*)    ALT 110 (*)    All other components within normal limits  CBC WITH DIFFERENTIAL/PLATELET - Abnormal; Notable for the following components:   WBC 12.3 (*)    RBC 3.36 (*)    Hemoglobin 9.6 (*)    HCT 34.1 (*)    MCV 101.5 (*)    MCHC 28.2 (*)    Neutro Abs 10.8 (*)     Lymphs Abs 0.6 (*)    Abs Immature Granulocytes 0.08 (*)    All other components within normal limits  URINALYSIS, ROUTINE W REFLEX MICROSCOPIC - Abnormal; Notable for the following components:   Color, Urine AMBER (*)    APPearance CLOUDY (*)    Glucose, UA >=500 (*)    Protein, ur 100 (*)    Bacteria, UA RARE (*)    All other components within normal limits  BLOOD GAS, ARTERIAL - Abnormal; Notable for the following components:  pH, Arterial 7.182 (*)    pCO2 arterial 103 (*)    pO2, Arterial 142 (*)    Bicarbonate 37.9 (*)    Acid-Base Excess 9.4 (*)    All other components within normal limits  TROPONIN I - Abnormal; Notable for the following components:   Troponin I 0.03 (*)    All other components within normal limits  LACTIC ACID, PLASMA - Abnormal; Notable for the following components:   Lactic Acid, Venous 4.9 (*)    All other components within normal limits  SARS CORONAVIRUS 2 (HOSPITAL ORDER, PERFORMED IN [facility])  CULTURE, BLOOD (ROUTINE X 2)  CULTURE, BLOOD (ROUTINE X 2)  URINE CULTURE  LACTIC ACID, PLASMA    EKG EKG Interpretation  Date/Time:  Monday Jan 18, 2019 20:18:35 EDT Ventricular Rate:  148 PR Interval:    QRS Duration: 96 QT Interval:  318 QTC Calculation: 499 R Axis:   80 Text Interpretation:  Atrial fibrillation with rapid V-rate Repolarization abnormality, prob rate related No old tracing to compare Confirmed by Melene Plan [phone]) on Jan 18, 2019 8:32:57 PM   Radiology Dg Chest Port 1 View  Result Date: 18-Jan-2019 CLINICAL DATA:  73 year old male with sepsis. EXAM: PORTABLE CHEST 1 VIEW COMPARISON:  11/26/2018 and earlier. FINDINGS: Portable AP supine view at 2057 hours. Stable large lung volumes. Stable cardiac size and mediastinal contours. Visualized tracheal air column is within normal limits. No pneumothorax, pleural effusion or consolidation. New asymmetric reticulonodular opacity in the left upper lung. No acute osseous  abnormality identified. IMPRESSION: New left upper lobe reticulonodular pulmonary opacity suspicious for acute viral/atypical respiratory infection. Underlying pulmonary hyperinflation. Electronically Signed   By: Odessa Fleming M.D.   On: 01/18/19 22:02    Procedures Procedures (including critical care time)  Medications Ordered in ED Medications  albuterol (PROVENTIL,VENTOLIN) solution continuous neb (has no administration in time range)  sodium chloride 0.9 % bolus 1,000 mL (1,000 mLs Intravenous New Bag/Given 18-Jan-2019 2101)    And  sodium chloride 0.9 % bolus 500 mL (0 mLs Intravenous Stopped 01-18-19 2117)    And  sodium chloride 0.9 % bolus 250 mL (0 mLs Intravenous Stopped 01/18/2019 2211)  vancomycin (VANCOCIN) IVPB 1000 mg/200 mL premix (0 mg Intravenous Stopped 2019-01-18 2208)  piperacillin-tazobactam (ZOSYN) IVPB 3.375 g (0 g Intravenous Stopped Jan 18, 2019 2135)  magnesium sulfate IVPB 2 g 50 mL (0 g Intravenous Stopped Jan 18, 2019 2121)  methylPREDNISolone sodium succinate (SOLU-MEDROL) 125 mg/2 mL injection 125 mg (125 mg Intravenous Given 01/18/2019 2102)     Initial Impression / Assessment and Plan / ED Course  I have reviewed the triage vital signs and the nursing notes.  Pertinent labs & imaging results that were available during my care of the patient were reviewed by me and considered in my medical decision making (see chart for details).        73 yo M with a chief complaint of shortness of breath.  Limited history.  Per prior hospitalizations the patient looks like he is in very poor health status.  His most recent hospitalization he was DNR.  Will obtain a laboratory evaluation chest x-ray give IV fluids.  Very cold on arrival I suspect that the patient may be septic though rectal temp normal.  Given broad spectrum abx.  Afib w rvr on arrival, will start with fluids.   I had a discussion with the daughter Lucendia Herrlich Gord, she confirmed the patient's most recent history of multiple  hospitalizations in the past 3 months  all due to the recurrent respiratory illness.  I had had a long discussion with palliative care and she stated that the patient himself had decided that he was going to be a DNR/DNI.  He had been intubated previously and no longer wanted to have that repeated.  She said that he was pretty adamant about that decision.  Now in NSR, in the upper 90's. Patient rate of breathing improved as well.  Mental status still diminished. Covid test negative, will start on bipap.  Admit.   The patients results and plan were reviewed and discussed.   Any x-rays performed were independently reviewed by myself.   Differential diagnosis were considered with the presenting HPI.  Medications  albuterol (PROVENTIL,VENTOLIN) solution continuous neb (has no administration in time range)  sodium chloride 0.9 % bolus 1,000 mL (1,000 mLs Intravenous New Bag/Given 12/02/2018 2101)    And  sodium chloride 0.9 % bolus 500 mL (0 mLs Intravenous Stopped 12/17/2018 2117)    And  sodium chloride 0.9 % bolus 250 mL (0 mLs Intravenous Stopped 12/27/2018 2211)  vancomycin (VANCOCIN) IVPB 1000 mg/200 mL premix (0 mg Intravenous Stopped 12/02/2018 2208)  piperacillin-tazobactam (ZOSYN) IVPB 3.375 g (0 g Intravenous Stopped 11/29/2018 2135)  magnesium sulfate IVPB 2 g 50 mL (0 g Intravenous Stopped 12/15/2018 2121)  methylPREDNISolone sodium succinate (SOLU-MEDROL) 125 mg/2 mL injection 125 mg (125 mg Intravenous Given 12/22/2018 2102)    Vitals:   12/03/2018 2145 12/26/2018 2200 12/24/2018 2230 12/27/2018 2312  BP: 137/72 139/75 139/79   Pulse:    94  Resp: (!) 26 (!) 31 (!) 27 (!) 25  Temp:      TempSrc:      SpO2:    100%    Final diagnoses:  Acute on chronic respiratory failure with hypoxia and hypercapnia (HCC)  HCAP (healthcare-associated pneumonia)    Admission/ observation were discussed with the admitting physician, patient and/or family and they are comfortable with the plan.    Final Clinical  Impressions(s) / ED Diagnoses   Final diagnoses:  Acute on chronic respiratory failure with hypoxia and hypercapnia (HCC)  HCAP (healthcare-associated pneumonia)    ED Discharge Orders    None       Melene Plan, DO 12/18/2018 2316

## 2018-12-26 ENCOUNTER — Inpatient Hospital Stay (HOSPITAL_COMMUNITY): Payer: Medicare Other

## 2018-12-26 DIAGNOSIS — R0603 Acute respiratory distress: Secondary | ICD-10-CM | POA: Diagnosis present

## 2018-12-26 DIAGNOSIS — I248 Other forms of acute ischemic heart disease: Secondary | ICD-10-CM | POA: Diagnosis present

## 2018-12-26 DIAGNOSIS — R627 Adult failure to thrive: Secondary | ICD-10-CM | POA: Diagnosis not present

## 2018-12-26 DIAGNOSIS — R0602 Shortness of breath: Secondary | ICD-10-CM | POA: Diagnosis not present

## 2018-12-26 DIAGNOSIS — F419 Anxiety disorder, unspecified: Secondary | ICD-10-CM | POA: Diagnosis not present

## 2018-12-26 DIAGNOSIS — E119 Type 2 diabetes mellitus without complications: Secondary | ICD-10-CM | POA: Diagnosis not present

## 2018-12-26 DIAGNOSIS — R945 Abnormal results of liver function studies: Secondary | ICD-10-CM | POA: Diagnosis not present

## 2018-12-26 DIAGNOSIS — J410 Simple chronic bronchitis: Secondary | ICD-10-CM | POA: Diagnosis not present

## 2018-12-26 DIAGNOSIS — Z515 Encounter for palliative care: Secondary | ICD-10-CM | POA: Diagnosis not present

## 2018-12-26 DIAGNOSIS — I4891 Unspecified atrial fibrillation: Secondary | ICD-10-CM | POA: Diagnosis not present

## 2018-12-26 DIAGNOSIS — Y95 Nosocomial condition: Secondary | ICD-10-CM | POA: Diagnosis present

## 2018-12-26 DIAGNOSIS — A419 Sepsis, unspecified organism: Secondary | ICD-10-CM | POA: Diagnosis present

## 2018-12-26 DIAGNOSIS — R6520 Severe sepsis without septic shock: Secondary | ICD-10-CM | POA: Diagnosis present

## 2018-12-26 DIAGNOSIS — Z20828 Contact with and (suspected) exposure to other viral communicable diseases: Secondary | ICD-10-CM | POA: Diagnosis present

## 2018-12-26 DIAGNOSIS — I361 Nonrheumatic tricuspid (valve) insufficiency: Secondary | ICD-10-CM | POA: Diagnosis not present

## 2018-12-26 DIAGNOSIS — D696 Thrombocytopenia, unspecified: Secondary | ICD-10-CM | POA: Diagnosis not present

## 2018-12-26 DIAGNOSIS — E7800 Pure hypercholesterolemia, unspecified: Secondary | ICD-10-CM | POA: Diagnosis not present

## 2018-12-26 DIAGNOSIS — J449 Chronic obstructive pulmonary disease, unspecified: Secondary | ICD-10-CM | POA: Diagnosis not present

## 2018-12-26 DIAGNOSIS — E874 Mixed disorder of acid-base balance: Secondary | ICD-10-CM | POA: Diagnosis present

## 2018-12-26 DIAGNOSIS — Z9981 Dependence on supplemental oxygen: Secondary | ICD-10-CM | POA: Diagnosis not present

## 2018-12-26 DIAGNOSIS — Z66 Do not resuscitate: Secondary | ICD-10-CM | POA: Diagnosis present

## 2018-12-26 DIAGNOSIS — J9622 Acute and chronic respiratory failure with hypercapnia: Secondary | ICD-10-CM | POA: Diagnosis present

## 2018-12-26 DIAGNOSIS — G9341 Metabolic encephalopathy: Secondary | ICD-10-CM | POA: Diagnosis present

## 2018-12-26 DIAGNOSIS — J189 Pneumonia, unspecified organism: Secondary | ICD-10-CM | POA: Diagnosis present

## 2018-12-26 DIAGNOSIS — J440 Chronic obstructive pulmonary disease with acute lower respiratory infection: Secondary | ICD-10-CM | POA: Diagnosis present

## 2018-12-26 DIAGNOSIS — J9621 Acute and chronic respiratory failure with hypoxia: Secondary | ICD-10-CM | POA: Diagnosis present

## 2018-12-26 DIAGNOSIS — Z681 Body mass index (BMI) 19 or less, adult: Secondary | ICD-10-CM | POA: Diagnosis not present

## 2018-12-26 DIAGNOSIS — L8990 Pressure ulcer of unspecified site, unspecified stage: Secondary | ICD-10-CM | POA: Insufficient documentation

## 2018-12-26 DIAGNOSIS — J9602 Acute respiratory failure with hypercapnia: Secondary | ICD-10-CM | POA: Diagnosis not present

## 2018-12-26 DIAGNOSIS — J441 Chronic obstructive pulmonary disease with (acute) exacerbation: Secondary | ICD-10-CM | POA: Diagnosis present

## 2018-12-26 DIAGNOSIS — I5032 Chronic diastolic (congestive) heart failure: Secondary | ICD-10-CM | POA: Diagnosis present

## 2018-12-26 LAB — COMPREHENSIVE METABOLIC PANEL
ALT: 83 U/L — ABNORMAL HIGH (ref 0–44)
ALT: 72 U/L — ABNORMAL HIGH (ref 0–44)
AST: 51 U/L — ABNORMAL HIGH (ref 15–41)
AST: 37 U/L (ref 15–41)
Albumin: 2.1 g/dL — ABNORMAL LOW (ref 3.5–5.0)
Albumin: 2.3 g/dL — ABNORMAL LOW (ref 3.5–5.0)
Alkaline Phosphatase: 49 U/L (ref 38–126)
Alkaline Phosphatase: 54 U/L (ref 38–126)
Anion gap: 5 (ref 5–15)
Anion gap: 9 (ref 5–15)
BUN: 16 mg/dL (ref 8–23)
BUN: 17 mg/dL (ref 8–23)
CO2: 43 mmol/L — ABNORMAL HIGH (ref 22–32)
CO2: 39 mmol/L — ABNORMAL HIGH (ref 22–32)
Calcium: 9.2 mg/dL (ref 8.9–10.3)
Calcium: 9.4 mg/dL (ref 8.9–10.3)
Chloride: 93 mmol/L — ABNORMAL LOW (ref 98–111)
Chloride: 92 mmol/L — ABNORMAL LOW (ref 98–111)
Creatinine, Ser: 0.74 mg/dL (ref 0.61–1.24)
Creatinine, Ser: 0.75 mg/dL (ref 0.61–1.24)
GFR calc Af Amer: >60 mL/min (ref >60)
GFR calc Af Amer: >60 mL/min (ref >60)
GFR calc non Af Amer: >60 mL/min (ref >60)
GFR calc non Af Amer: >60 mL/min (ref >60)
Glucose, Bld: 282 mg/dL — ABNORMAL HIGH (ref 70–99)
Glucose, Bld: 167 mg/dL — ABNORMAL HIGH (ref 70–99)
Potassium: 5.1 mmol/L (ref 3.5–5.1)
Potassium: 4.9 mmol/L (ref 3.5–5.1)
Sodium: 141 mmol/L (ref 135–145)
Sodium: 140 mmol/L (ref 135–145)
Total Bilirubin: 0.5 mg/dL (ref 0.3–1.2)
Total Bilirubin: 0.4 mg/dL (ref 0.3–1.2)
Total Protein: 5.6 g/dL — ABNORMAL LOW (ref 6.5–8.1)
Total Protein: 5.9 g/dL — ABNORMAL LOW (ref 6.5–8.1)

## 2018-12-26 LAB — BLOOD GAS, ARTERIAL
Acid-Base Excess: 14.7 mmol/L — ABNORMAL HIGH (ref 0.0–2.0)
Acid-Base Excess: 16.9 mmol/L — ABNORMAL HIGH (ref 0.0–2.0)
Bicarbonate: 41.3 mmol/L — ABNORMAL HIGH (ref 20.0–28.0)
Bicarbonate: 42.4 mmol/L — ABNORMAL HIGH (ref 20.0–28.0)
Delivery systems: POSITIVE
Delivery systems: POSITIVE
Drawn by: 519031
Expiratory PAP: 8
Expiratory PAP: 10
FIO2: 40
FIO2: 30
Inspiratory PAP: 16
Inspiratory PAP: 16
O2 Saturation: 98 %
O2 Saturation: 89.1 %
Patient temperature: 98.6
Patient temperature: 98.6
pCO2 arterial: 82.6 mmHg (ref 32.0–48.0)
pCO2 arterial: 64.7 mmHg — ABNORMAL HIGH (ref 32.0–48.0)
pH, Arterial: 7.32 — ABNORMAL LOW (ref 7.350–7.450)
pH, Arterial: 7.432 (ref 7.350–7.450)
pO2, Arterial: 118 mmHg — ABNORMAL HIGH (ref 83.0–108.0)
pO2, Arterial: 56.6 mmHg — ABNORMAL LOW (ref 83.0–108.0)

## 2018-12-26 LAB — BRAIN NATRIURETIC PEPTIDE: B Natriuretic Peptide: 96.7 pg/mL (ref 0.0–100.0)

## 2018-12-26 LAB — PROCALCITONIN: Procalcitonin: 0.17 ng/mL

## 2018-12-26 LAB — RESPIRATORY PANEL BY PCR
Adenovirus: NOT DETECTED
Bordetella pertussis: NOT DETECTED
Chlamydophila pneumoniae: NOT DETECTED
Coronavirus 229E: NOT DETECTED
Coronavirus HKU1: DETECTED — AB
Coronavirus NL63: NOT DETECTED
Coronavirus OC43: NOT DETECTED
Influenza A: NOT DETECTED
Influenza B: NOT DETECTED
Metapneumovirus: NOT DETECTED
Mycoplasma pneumoniae: NOT DETECTED
Parainfluenza Virus 1: NOT DETECTED
Parainfluenza Virus 2: NOT DETECTED
Parainfluenza Virus 3: NOT DETECTED
Parainfluenza Virus 4: NOT DETECTED
Respiratory Syncytial Virus: NOT DETECTED
Rhinovirus / Enterovirus: NOT DETECTED

## 2018-12-26 LAB — TROPONIN I
Troponin I: 0.12 ng/mL (ref <0.03)
Troponin I: 0.31 ng/mL (ref <0.03)
Troponin I: 0.36 ng/mL (ref <0.03)
Troponin I: 0.31 ng/mL (ref <0.03)
Troponin I: 0.28 ng/mL (ref <0.03)

## 2018-12-26 LAB — URINE CULTURE: Culture: <10000 — AB

## 2018-12-26 LAB — INFLUENZA PANEL BY PCR (TYPE A & B)
Influenza A By PCR: NEGATIVE
Influenza B By PCR: NEGATIVE

## 2018-12-26 LAB — GLUCOSE, CAPILLARY
Glucose-Capillary: 267 mg/dL — ABNORMAL HIGH (ref 70–99)
Glucose-Capillary: 138 mg/dL — ABNORMAL HIGH (ref 70–99)
Glucose-Capillary: 129 mg/dL — ABNORMAL HIGH (ref 70–99)
Glucose-Capillary: 49 mg/dL — ABNORMAL LOW (ref 70–99)
Glucose-Capillary: 201 mg/dL — ABNORMAL HIGH (ref 70–99)

## 2018-12-26 LAB — CBC WITH DIFFERENTIAL/PLATELET
Abs Immature Granulocytes: 0.04 10*3/uL (ref 0.00–0.07)
Basophils Absolute: 0 10*3/uL (ref 0.0–0.1)
Basophils Relative: 0 %
Eosinophils Absolute: 0 10*3/uL (ref 0.0–0.5)
Eosinophils Relative: 0 %
HCT: 28.1 % — ABNORMAL LOW (ref 39.0–52.0)
Hemoglobin: 8.1 g/dL — ABNORMAL LOW (ref 13.0–17.0)
Immature Granulocytes: 1 %
Lymphocytes Relative: 6 %
Lymphs Abs: 0.5 10*3/uL — ABNORMAL LOW (ref 0.7–4.0)
MCH: 28.7 pg (ref 26.0–34.0)
MCHC: 28.8 g/dL — ABNORMAL LOW (ref 30.0–36.0)
MCV: 99.6 fL (ref 80.0–100.0)
Monocytes Absolute: 0.4 10*3/uL (ref 0.1–1.0)
Monocytes Relative: 5 %
Neutro Abs: 7.6 10*3/uL (ref 1.7–7.7)
Neutrophils Relative %: 88 %
Platelets: 194 10*3/uL (ref 150–400)
RBC: 2.82 MIL/uL — ABNORMAL LOW (ref 4.22–5.81)
RDW: 15.2 % (ref 11.5–15.5)
WBC: 8.6 10*3/uL (ref 4.0–10.5)
nRBC: 0 % (ref 0.0–0.2)

## 2018-12-26 LAB — LIPID PANEL
Cholesterol: 125 mg/dL (ref 0–200)
HDL: 58 mg/dL (ref >40)
LDL Cholesterol: 58 mg/dL (ref 0–99)
Total CHOL/HDL Ratio: 2.2 RATIO
Triglycerides: 43 mg/dL (ref <150)
VLDL: 9 mg/dL (ref 0–40)

## 2018-12-26 LAB — LACTIC ACID, PLASMA
Lactic Acid, Venous: 0.9 mmol/L (ref 0.5–1.9)
Lactic Acid, Venous: 0.9 mmol/L (ref 0.5–1.9)
Lactic Acid, Venous: 1.2 mmol/L (ref 0.5–1.9)
Lactic Acid, Venous: 2.4 mmol/L (ref 0.5–1.9)
Lactic Acid, Venous: 3.4 mmol/L (ref 0.5–1.9)

## 2018-12-26 LAB — HIV ANTIBODY (ROUTINE TESTING W REFLEX): HIV Screen 4th Generation wRfx: NONREACTIVE

## 2018-12-26 LAB — HEMOGLOBIN A1C
Hgb A1c MFr Bld: 7.6 % — ABNORMAL HIGH (ref 4.8–5.6)
Mean Plasma Glucose: 171.42 mg/dL

## 2018-12-26 LAB — POTASSIUM: Potassium: 5.2 mmol/L — ABNORMAL HIGH (ref 3.5–5.1)

## 2018-12-26 LAB — STREP PNEUMONIAE URINARY ANTIGEN: Strep Pneumo Urinary Antigen: NEGATIVE

## 2018-12-26 LAB — AMMONIA: Ammonia: 27 umol/L (ref 9–35)

## 2018-12-26 LAB — MRSA PCR SCREENING: MRSA by PCR: NEGATIVE

## 2018-12-26 MED ORDER — INSULIN ASPART 100 UNIT/ML ~~LOC~~ SOLN
0.0000 [IU] | SUBCUTANEOUS | Status: DC
  Administered 2018-12-26 – 2018-12-27 (×2): 2 [IU] via SUBCUTANEOUS
  Administered 2018-12-27: 12:00:00 3 [IU] via SUBCUTANEOUS
  Administered 2018-12-27: 2 [IU] via SUBCUTANEOUS

## 2018-12-26 MED ORDER — LEVALBUTEROL HCL 1.25 MG/0.5ML IN NEBU: 1.2500 mg | INHALATION_SOLUTION | Freq: Four times a day (QID) | RESPIRATORY_TRACT | Status: DC

## 2018-12-26 MED ORDER — HYDRALAZINE HCL 20 MG/ML IJ SOLN: 5.0000 mg | INTRAMUSCULAR | Status: DC | PRN

## 2018-12-26 MED ORDER — SODIUM CHLORIDE 0.9 % IV SOLN
INTRAVENOUS | Status: DC
  Administered 2018-12-26 – 2019-01-02 (×7): via INTRAVENOUS

## 2018-12-26 MED ORDER — SODIUM CHLORIDE 0.9 % IV SOLN
500.0000 mg | Freq: Every day | INTRAVENOUS | Status: DC
  Administered 2018-12-26 – 2018-12-30 (×5): 500 mg via INTRAVENOUS
  Filled 2018-12-26 (×7): qty 500

## 2018-12-26 MED ORDER — BASAGLAR KWIKPEN 100 UNIT/ML ~~LOC~~ SOPN: 7.0000 [IU] | PEN_INJECTOR | SUBCUTANEOUS | Status: DC

## 2018-12-26 MED ORDER — DEXTROSE 50 % IV SOLN
INTRAVENOUS | Status: AC
  Administered 2018-12-26: 17:00:00 50 mL
  Filled 2018-12-26: qty 50

## 2018-12-26 MED ORDER — FAMOTIDINE IN NACL 20-0.9 MG/50ML-% IV SOLN
20.0000 mg | INTRAVENOUS | Status: DC
  Administered 2018-12-26 – 2018-12-30 (×5): 20 mg via INTRAVENOUS
  Filled 2018-12-26 (×5): qty 50

## 2018-12-26 MED ORDER — ONDANSETRON HCL 4 MG/2ML IJ SOLN: 4.0000 mg | Freq: Three times a day (TID) | INTRAMUSCULAR | Status: DC | PRN

## 2018-12-26 MED ORDER — SODIUM CHLORIDE 0.9 % IV BOLUS
500.0000 mL | Freq: Once | INTRAVENOUS | Status: AC
  Administered 2018-12-26: 500 mL via INTRAVENOUS

## 2018-12-26 MED ORDER — SODIUM CHLORIDE 0.9 % IV SOLN
2.0000 g | INTRAVENOUS | Status: DC
  Administered 2018-12-26 – 2018-12-30 (×5): 2 g via INTRAVENOUS
  Filled 2018-12-26 (×7): qty 20

## 2018-12-26 MED ORDER — INSULIN GLARGINE 100 UNIT/ML ~~LOC~~ SOLN
7.0000 [IU] | Freq: Every day | SUBCUTANEOUS | Status: DC
  Filled 2018-12-26: qty 0.07

## 2018-12-26 MED ORDER — LEVALBUTEROL HCL 1.25 MG/0.5ML IN NEBU
1.2500 mg | INHALATION_SOLUTION | Freq: Four times a day (QID) | RESPIRATORY_TRACT | Status: DC
  Administered 2018-12-26 (×2): 1.25 mg via RESPIRATORY_TRACT
  Filled 2018-12-26 (×2): qty 0.5

## 2018-12-26 MED ORDER — VANCOMYCIN HCL IN DEXTROSE 1-5 GM/200ML-% IV SOLN
1000.0000 mg | INTRAVENOUS | Status: DC
  Filled 2018-12-26: qty 200

## 2018-12-26 MED ORDER — PIPERACILLIN-TAZOBACTAM 3.375 G IVPB
3.3750 g | Freq: Three times a day (TID) | INTRAVENOUS | Status: DC
  Administered 2018-12-26: 3.375 g via INTRAVENOUS
  Filled 2018-12-26 (×4): qty 50

## 2018-12-26 MED ORDER — IPRATROPIUM BROMIDE 0.02 % IN SOLN: 0.5000 mg | RESPIRATORY_TRACT | Status: DC

## 2018-12-26 MED ORDER — METHYLPREDNISOLONE SODIUM SUCC 125 MG IJ SOLR
60.0000 mg | Freq: Three times a day (TID) | INTRAMUSCULAR | Status: DC
  Administered 2018-12-26 – 2018-12-28 (×7): 60 mg via INTRAVENOUS
  Filled 2018-12-26 (×7): qty 2

## 2018-12-26 MED ORDER — ENOXAPARIN SODIUM 40 MG/0.4ML ~~LOC~~ SOLN
40.0000 mg | SUBCUTANEOUS | Status: DC
  Administered 2018-12-26 – 2018-12-27 (×2): 40 mg via SUBCUTANEOUS
  Filled 2018-12-26 (×2): qty 0.4

## 2018-12-26 MED ORDER — ASPIRIN 300 MG RE SUPP
300.0000 mg | Freq: Every day | RECTAL | Status: DC
  Administered 2018-12-26 – 2018-12-28 (×3): 300 mg via RECTAL
  Filled 2018-12-26 (×3): qty 1

## 2018-12-26 MED ORDER — IPRATROPIUM-ALBUTEROL 0.5-2.5 (3) MG/3ML IN SOLN
3.0000 mL | Freq: Four times a day (QID) | RESPIRATORY_TRACT | Status: DC
  Administered 2018-12-26 – 2018-12-27 (×3): 3 mL via RESPIRATORY_TRACT
  Filled 2018-12-26 (×4): qty 3

## 2018-12-26 MED ORDER — ALBUTEROL SULFATE (2.5 MG/3ML) 0.083% IN NEBU
2.5000 mg | INHALATION_SOLUTION | RESPIRATORY_TRACT | Status: DC | PRN
  Administered 2018-12-27 – 2019-01-11 (×7): 2.5 mg via RESPIRATORY_TRACT
  Filled 2018-12-26 (×9): qty 3

## 2018-12-26 MED ORDER — IPRATROPIUM BROMIDE 0.02 % IN SOLN
0.5000 mg | Freq: Four times a day (QID) | RESPIRATORY_TRACT | Status: DC
  Administered 2018-12-26: 09:00:00 0.5 mg via RESPIRATORY_TRACT
  Filled 2018-12-26: qty 2.5

## 2018-12-26 MED ORDER — INSULIN GLARGINE 100 UNIT/ML ~~LOC~~ SOLN
20.0000 [IU] | SUBCUTANEOUS | Status: DC
  Administered 2018-12-26 – 2018-12-27 (×2): 20 [IU] via SUBCUTANEOUS
  Filled 2018-12-26 (×3): qty 0.2

## 2018-12-26 MED ORDER — INSULIN ASPART 100 UNIT/ML ~~LOC~~ SOLN
0.0000 [IU] | Freq: Three times a day (TID) | SUBCUTANEOUS | Status: DC
  Administered 2018-12-26: 1 [IU] via SUBCUTANEOUS
  Administered 2018-12-26: 5 [IU] via SUBCUTANEOUS

## 2018-12-26 NOTE — Plan of Care (Signed)
  Problem: Education: Goal: Knowledge of General Education information will improve Description: Including pain rating scale, medication(s)/side effects and non-pharmacologic comfort measures Outcome: Progressing   Problem: Clinical Measurements: Goal: Ability to maintain clinical measurements within normal limits will improve Outcome: Progressing Goal: Will remain free from infection Outcome: Progressing Goal: Diagnostic test results will improve Outcome: Progressing Goal: Respiratory complications will improve Outcome: Progressing Goal: Cardiovascular complication will be avoided Outcome: Progressing   Problem: Nutrition: Goal: Adequate nutrition will be maintained Outcome: Progressing   Problem: Safety: Goal: Ability to remain free from injury will improve Outcome: Progressing   

## 2018-12-26 NOTE — ED Notes (Signed)
ED TO INPATIENT HANDOFF REPORT  ED Nurse Name and Phone #: jess f    S Name/Age/Gender Gregory Stone 73 y.o. male Room/Bed: TRACC/TRACC  Code Status   Code Status: DNR  Home/SNF/Other Nursing Home Patient oriented to:  Is this baseline? Yes   Triage Complete: Triage complete  Chief Complaint Resp.Distress   Triage Note Pt comes from [facility] via [facility], called out for being minimally responsive after a breathing treatment, nursing home then gave him some ativan. Pt in afib RVR, on non rebreather   Allergies Allergies  Allergen Reactions  . Lisinopril Swelling    Angioedema   . Sulfamethoxazole-Trimethoprim Anaphylaxis and Other (See Comments)    Other reaction(s): Muscle Pain   . Ticagrelor Shortness Of Breath       . Trimethoprim Other (See Comments)    Per MAR    Level of Care/Admitting Diagnosis ED Disposition    ED Disposition Condition Comment   Admit  Hospital Area: [facility] [[id number]]  Level of Care: Progressive [102]  Covid Evaluation: N/A  Diagnosis: COPD exacerbation [facility]) [[id number]]  Admitting Physician: Lorretta Harp [[id number]]  Attending Physician: Lorretta Harp [phone]  Estimated length of stay: past midnight tomorrow  Certification:: I certify this patient will need inpatient services for at least 2 midnights  Bed request comments: COVID19 negative  PT Class (Do Not Modify): Inpatient [101]  PT Acc Code (Do Not Modify): Private [1]       B Medical/Surgery History Past Medical History:  Diagnosis Date  . A-fib (HCC)   . COPD (chronic obstructive pulmonary disease) (HCC)   . Depression   . Diabetes mellitus without complication (HCC)   . HLD (hyperlipidemia)    History reviewed. No pertinent surgical history.   A IV Location/Drains/Wounds Patient Lines/Drains/Airways Status   Active Line/Drains/Airways    Name:   Placement date:   Placement time:   Site:   Days:   Peripheral IV Dec 29, 2018 Left Hand   12-29-2018     2021    Hand   1          Intake/Output Last 24 hours  Intake/Output Summary (Last 24 hours) at 12/26/2018 0034 Last data filed at 2018/12/29 2211 Gross per 24 hour  Intake 4100 ml  Output -  Net 4100 ml    Labs/Imaging Results for orders placed or performed during the hospital encounter of Dec 29, 2018 (from the past 48 hour(s))  Blood gas, arterial ([facility], [facility], [facility])     Status: Abnormal   Collection Time: 12-29-2018  8:28 PM  Result Value Ref Range   O2 Content 5.0 L/min   Delivery systems NASAL CANNULA    pH, Arterial 7.182 (LL) 7.350 - 7.450    Comment: CRITICAL RESULT CALLED TO, READ BACK BY AND VERIFIED WITH: DR.FLOYD AT 2040 BY TIM SNIDER RCP,RRT ON 2018/12/29    pCO2 arterial 103 (HH) 32.0 - 48.0 mmHg    Comment: CRITICAL RESULT CALLED TO, READ BACK BY AND VERIFIED WITH: DR,FLOYD AT 2040 BY TIM SNIDER RCP,RRT ON 29-Dec-2018    pO2, Arterial 142 (H) 83.0 - 108.0 mmHg   Bicarbonate 37.9 (H) 20.0 - 28.0 mmol/L   Acid-Base Excess 9.4 (H) 0.0 - 2.0 mmol/L   O2 Saturation 97.9 %   Patient temperature 96.7    Collection site RIGHT RADIAL    Drawn by [id number]    Sample type ARTERIAL DRAW    Allens test (pass/fail) PASS PASS  Urinalysis, Routine w reflex microscopic (not at [facility])  Status: Abnormal   Collection Time: 12/10/2018  8:36 PM  Result Value Ref Range   Color, Urine AMBER (A) YELLOW    Comment: BIOCHEMICALS MAY BE AFFECTED BY COLOR   APPearance CLOUDY (A) CLEAR   Specific Gravity, Urine 1.020 1.005 - 1.030   pH 5.0 5.0 - 8.0   Glucose, UA >=500 (A) NEGATIVE mg/dL   Hgb urine dipstick NEGATIVE NEGATIVE   Bilirubin Urine NEGATIVE NEGATIVE   Ketones, ur NEGATIVE NEGATIVE mg/dL   Protein, ur 062 (A) NEGATIVE mg/dL   Nitrite NEGATIVE NEGATIVE   Leukocytes,Ua NEGATIVE NEGATIVE   RBC / HPF 11-20 0 - 5 RBC/hpf   WBC, UA 11-20 0 - 5 WBC/hpf   Bacteria, UA RARE (A) NONE SEEN   Squamous Epithelial / LPF 0-5 0 - 5   Mucus PRESENT    Hyaline Casts, UA PRESENT     Comment:  Performed at [facility], [address] [place], [place] [address]  SARS Coronavirus 2 [facility] order, Performed in [facility] hospital lab)     Status: None   Collection Time: 12/09/2018  8:45 PM  Result Value Ref Range   SARS Coronavirus 2 NEGATIVE NEGATIVE    Comment: (NOTE) If result is NEGATIVE SARS-CoV-2 target nucleic acids are NOT DETECTED. The SARS-CoV-2 RNA is generally detectable in upper and lower  respiratory specimens during the acute phase of infection. The lowest  concentration of SARS-CoV-2 viral copies this assay can detect is 250  copies / mL. A negative result does not preclude SARS-CoV-2 infection  and should not be used as the sole basis for treatment or other  patient management decisions.  A negative result may occur with  improper specimen collection / handling, submission of specimen other  than nasopharyngeal swab, presence of viral mutation(s) within the  areas targeted by this assay, and inadequate number of viral copies  (<250 copies / mL). A negative result must be combined with clinical  observations, patient history, and epidemiological information. If result is POSITIVE SARS-CoV-2 target nucleic acids are DETECTED. The SARS-CoV-2 RNA is generally detectable in upper and lower  respiratory specimens dur ing the acute phase of infection.  Positive  results are indicative of active infection with SARS-CoV-2.  Clinical  correlation with patient history and other diagnostic information is  necessary to determine patient infection status.  Positive results do  not rule out bacterial infection or co-infection with other viruses. If result is PRESUMPTIVE POSTIVE SARS-CoV-2 nucleic acids MAY BE PRESENT.   A presumptive positive result was obtained on the submitted specimen  and confirmed on repeat testing.  While 2019 novel coronavirus  (SARS-CoV-2) nucleic acids may be present in the submitted sample  additional confirmatory testing may be necessary  for epidemiological  and / or clinical management purposes  to differentiate between  SARS-CoV-2 and other Sarbecovirus currently known to infect humans.  If clinically indicated additional testing with an alternate test  methodology [id number]) is advised. The SARS-CoV-2 RNA is generally  detectable in upper and lower respiratory sp ecimens during the acute  phase of infection. The expected result is Negative. Fact Sheet for Patients:  BoilerBrush.com.cy Fact Sheet for Healthcare Providers: https://pope.com/ This test is not yet approved or cleared by the [place] FDA and has been authorized for detection and/or diagnosis of SARS-CoV-2 by FDA under an Emergency Use Authorization (EUA).  This EUA will remain in effect (meaning this test can be used) for the duration of the COVID-19 declaration under Section  564(b)(1) of the Act, 21 U.S.C. section 360bbb-3(b)(1), unless the authorization is terminated or revoked sooner. Performed at [facility], [address] [place], [place] [address]   Comprehensive metabolic panel     Status: Abnormal   Collection Time: 12/06/2018  9:02 PM  Result Value Ref Range   Sodium 140 135 - 145 mmol/L   Potassium 5.2 (H) 3.5 - 5.1 mmol/L   Chloride 87 (L) 98 - 111 mmol/L   CO2 39 (H) 22 - 32 mmol/L   Glucose, Bld 346 (H) 70 - 99 mg/dL   BUN 18 8 - 23 mg/dL   Creatinine, Ser 6.04 0.61 - 1.24 mg/dL   Calcium 9.8 8.9 - 54.0 mg/dL   Total Protein 6.6 6.5 - 8.1 g/dL   Albumin 2.5 (L) 3.5 - 5.0 g/dL   AST 981 (H) 15 - 41 U/L   ALT 110 (H) 0 - 44 U/L   Alkaline Phosphatase 61 38 - 126 U/L   Total Bilirubin 0.8 0.3 - 1.2 mg/dL   GFR calc non Af Amer >60 >60 mL/min   GFR calc Af Amer >60 >60 mL/min   Anion gap 14 5 - 15    Comment: Performed at [facility], [address] [place], [place] [address]  CBC WITH DIFFERENTIAL     Status: Abnormal   Collection Time: 12/17/2018  9:02 PM  Result  Value Ref Range   WBC 12.3 (H) 4.0 - 10.5 K/uL   RBC 3.36 (L) 4.22 - 5.81 MIL/uL   Hemoglobin 9.6 (L) 13.0 - 17.0 g/dL   HCT 82.9 (L) 56.2 - 13.0 %   MCV 101.5 (H) 80.0 - 100.0 fL   MCH 28.6 26.0 - 34.0 pg   MCHC 28.2 (L) 30.0 - 36.0 g/dL   RDW 86.5 78.4 - 69.6 %   Platelets 250 150 - 400 K/uL   nRBC 0.2 0.0 - 0.2 %   Neutrophils Relative % 88 %   Neutro Abs 10.8 (H) 1.7 - 7.7 K/uL   Lymphocytes Relative 5 %   Lymphs Abs 0.6 (L) 0.7 - 4.0 K/uL   Monocytes Relative 6 %   Monocytes Absolute 0.7 0.1 - 1.0 K/uL   Eosinophils Relative 0 %   Eosinophils Absolute 0.0 0.0 - 0.5 K/uL   Basophils Relative 0 %   Basophils Absolute 0.0 0.0 - 0.1 K/uL   Immature Granulocytes 1 %   Abs Immature Granulocytes 0.08 (H) 0.00 - 0.07 K/uL    Comment: Performed at [facility], [address] [place], [place] [address]  Troponin I - ONCE - STAT     Status: Abnormal   Collection Time: 12/07/2018  9:02 PM  Result Value Ref Range   Troponin I 0.03 (HH) <0.03 ng/mL    Comment: CRITICAL RESULT CALLED TO, READ BACK BY AND VERIFIED WITH: Rhona Leavens 12/09/2018 2148 WAYK Performed at [facility], [address] [place], [place] [address]   Lactic acid, plasma     Status: Abnormal   Collection Time: 12/15/2018 10:09 PM  Result Value Ref Range   Lactic Acid, Venous 4.9 (HH) 0.5 - 1.9 mmol/L    Comment: CRITICAL RESULT CALLED TO, READ BACK BY AND VERIFIED WITH: Rhona Leavens 12/20/2018 2231 WAYK Performed at [facility], [address] [place], [place] [address]   Lactic acid, plasma     Status: None   Collection Time: 12/01/2018 11:49 PM  Result Value Ref Range  Lactic Acid, Venous 0.9 0.5 - 1.9 mmol/L    Comment: Performed at [facility], [address] [place], [place] [address]   Dg Chest Port 1 View  Result Date: 01-14-2019 CLINICAL DATA:  73 year old male with sepsis. EXAM: PORTABLE CHEST 1 VIEW COMPARISON:  11/26/2018 and earlier. FINDINGS: Portable AP supine view  at 2057 hours. Stable large lung volumes. Stable cardiac size and mediastinal contours. Visualized tracheal air column is within normal limits. No pneumothorax, pleural effusion or consolidation. New asymmetric reticulonodular opacity in the left upper lung. No acute osseous abnormality identified. IMPRESSION: New left upper lobe reticulonodular pulmonary opacity suspicious for acute viral/atypical respiratory infection. Underlying pulmonary hyperinflation. Electronically Signed   By: Odessa Fleming M.D.   On: Jan 14, 2019 22:02    Pending Labs Unresulted Labs (From admission, onward)    Start     Ordered   12/26/18 0500  Hemoglobin A1c  Tomorrow morning,   R     12/26/18 0012   12/26/18 0500  Lipid panel  Tomorrow morning,   R    Comments:  Please obtain as a fasting lipid panel - should not have eaten/ drank food for 8 hours prior to labs.    12/26/18 0012   12/26/18 0018  Potassium  ONCE - STAT,   R     12/26/18 0017   12/26/18 0016  Legionella Pneumophila Serogp 1 Ur Ag  Once,   R     12/26/18 0015   12/26/18 0014  Hepatitis panel, acute  Once,   R     12/26/18 0013   12/26/18 0014  Culture, sputum-assessment  Once,   R     12/26/18 0015   12/26/18 0014  Gram stain  Once,   R     12/26/18 0015   12/26/18 0014  HIV antibody (Routine Screening)  Once,   R     12/26/18 0015   12/26/18 0014  Strep pneumoniae urinary antigen  Once,   R     12/26/18 0015   12/26/18 0012  Lactic acid, plasma  STAT Now then every 3 hours,   STAT     12/26/18 0011   12/26/18 0012  Procalcitonin  ONCE - STAT,   R     12/26/18 0011   12/26/18 0012  Troponin I - Now Then Q6H  Now then every 6 hours,   R     12/26/18 0012   12/26/18 0011  Influenza panel by PCR (type A & B)  Once,   R     12/26/18 0010   12/26/18 0011  Respiratory Panel by PCR  (Respiratory virus panel with precautions)  Once,   R     12/26/18 0010   12/26/18 0010  Ammonia  Once,   R     12/26/18 0010   2019/01/14 2028  Blood Culture (routine x 2)   BLOOD CULTURE X 2,   STAT     01-14-19 2029   2019-01-14 2028  Urine culture  ONCE - STAT,   STAT     Jan 14, 2019 2029          Vitals/Pain Today's Vitals   01-14-2019 2230 2019-01-14 2300 01-14-19 2312 01-14-19 2320  BP: 139/79 136/81    Pulse:   94   Resp: (!) 27 (!) 27 (!) 25   Temp:      TempSrc:      SpO2:   100% 98%    Isolation Precautions Droplet precaution  Medications Medications  methylPREDNISolone sodium succinate (SOLU-MEDROL) 125 mg/2 mL injection 60 mg (has no administration in time range)  Basaglar KwikPen KwikPen 7-20 Units (has no administration in time range)  ipratropium (ATROVENT) nebulizer solution 0.5 mg (0.5 mg Nebulization Not Given 12/26/18 0016)  levalbuterol (XOPENEX) nebulizer solution 1.25 mg (1.25 mg Nebulization Not Given 12/26/18 0017)  0.9 %  sodium chloride infusion (has no administration in time range)  insulin aspart (novoLOG) injection 0-9 Units (has no administration in time range)  azithromycin (ZITHROMAX) 500 mg in sodium chloride 0.9 % 250 mL IVPB (has no administration in time range)  enoxaparin (LOVENOX) injection 40 mg (has no administration in time range)  ondansetron (ZOFRAN) injection 4 mg (has no administration in time range)  hydrALAZINE (APRESOLINE) injection 5 mg (has no administration in time range)  famotidine (PEPCID) IVPB 20 mg premix (has no administration in time range)  vancomycin (VANCOCIN) IVPB 1000 mg/200 mL premix (has no administration in time range)  piperacillin-tazobactam (ZOSYN) IVPB 3.375 g (has no administration in time range)  sodium chloride 0.9 % bolus 1,000 mL (0 mLs Intravenous Stopped 01/01/2019 2131)    And  sodium chloride 0.9 % bolus 500 mL (0 mLs Intravenous Stopped 2019-01-01 2117)    And  sodium chloride 0.9 % bolus 250 mL (0 mLs Intravenous Stopped Jan 01, 2019 2211)  vancomycin (VANCOCIN) IVPB 1000 mg/200 mL premix (0 mg Intravenous Stopped Jan 01, 2019 2208)  piperacillin-tazobactam (ZOSYN) IVPB 3.375 g (0 g  Intravenous Stopped 01-01-19 2135)  magnesium sulfate IVPB 2 g 50 mL (0 g Intravenous Stopped Jan 01, 2019 2121)  methylPREDNISolone sodium succinate (SOLU-MEDROL) 125 mg/2 mL injection 125 mg (125 mg Intravenous Given 01/01/2019 2102)    Mobility Bedbound  Moderate fall risk   Focused Assessments Pulmonary Assessment Handoff:  Lung sounds: Bilateral Breath Sounds: Diminished O2 Device: Bi-PAP        R Recommendations: See Admitting Provider Note  Report given to:   Additional Notes: n/a

## 2018-12-26 NOTE — TOC Progression Note (Signed)
Transition of Care [facility]) - Progression Note    Patient Details  Name: Gregory Stone MRN: [id number] Date of Birth: October 18, 1945  Transition of Care [facility] CM/SW Contact  Eduard Roux, [place] Phone Number:[phone] 12/26/2018, 2:31 PM  Clinical Narrative:     CSW called patient's daughter, Lucendia Herrlich Gord- CSW left vopice message- CSW spoke with the patient's son, Iyaad, and he confirm the patient is from [facility] and the family anticipates him returning when medically stable.    Antony Blackbird, MSW, LCSWA Clinical Social Worker [phone]        Expected Discharge Plan and Services                                                 Social Determinants of Health (SDOH) Interventions    Readmission Risk Interventions No flowsheet data found.

## 2018-12-26 NOTE — Progress Notes (Signed)
Hypoglycemic Event  CBG: 49  Treatment: 1/2 amp D50 IV Symptoms: none   Follow-up CBG: Time:1725 CBG Result:129  Possible Reasons for Event: NPO Comments/MD notified: Dr Angela Nevin

## 2018-12-26 NOTE — Progress Notes (Signed)
Patient resting comfortably BIPAP not needed at this time. RT will continue to monitor

## 2018-12-26 NOTE — Progress Notes (Signed)
Initial Nutrition Assessment  INTERVENTION:   Once diet advanced:  -Recommend Ensure MAX Protein po daily, each supplement provides 150 kcal and 30 grams of protein -Prostat liquid protein PO 30 ml BID with meals, each supplement provides 100 kcal, 15 grams protein.  NUTRITION DIAGNOSIS:   Increased nutrient needs related to wound healing as evidenced by estimated needs.  GOAL:   Patient will meet greater than or equal to 90% of their needs  MONITOR:   Diet advancement, Labs, Weight trends, I & O's, Skin  REASON FOR ASSESSMENT:   Low Braden(Wound)    ASSESSMENT:   73 y.o. male with medical history significant of hyperlipidemia, diabetes mellitus, COPD, atrial fibrillation on Eliquis, depression, who presents with altered mental status and shortness of breath. Admitted for Sepsis and acute on chronic respiratory failure with hypoxia and hypercapnia due to HCAP and COPD exacerbation.   **RD working remotely**  Patient currently on Bi-Pap, unable to give nutritional history at this time. Pt is NPO. Pt was admitted with SOB and AMS. 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 Per chart review, pt was taking Promod supplements PTA at SNF.   Once diet is advanced, recommend Prostat and Ensure Max to aid in wound healing.  Per weight records, pt weighed 145 lb on 1/10 (per Donnal Debar of the [facility]). Pt has had a 6% wt loss x 3.5 months, which is insignificant for time frame. Pt is bedbound and has been taking Lasix PTA.  Medications reviewed. Labs reviewed: CBGs: 138-267  NUTRITION - FOCUSED PHYSICAL EXAM:  Unable to perform per department requirements to work remotely.  Diet Order:   Diet Order            Diet NPO time specified  Diet effective now              EDUCATION NEEDS:   No education needs have been identified at this time  Skin:  Skin Assessment: Skin Integrity Issues: Skin Integrity Issues:: Stage III Stage III: sacrum  Last  BM:  PTA  Height:   Ht Readings from Last 1 Encounters:  No data found for Ht  Per Care Everywhere pt was 5'8" on 3/8.  Weight:   Wt Readings from Last 1 Encounters:  12/26/18 61.7 kg    Ideal Body Weight:  70 kg(based on height of 5'8" from 3/8)  BMI: 20.5 kg/m^2  Estimated Nutritional Needs:   Kcal:  1900-2100  Protein:  90-100g  Fluid:  1.9L/day   Tilda Franco, MS, RD, LDN [facility] Inpatient Clinical Dietitian Pager: [phone] After Hours Pager: [phone]

## 2018-12-26 NOTE — Progress Notes (Signed)
Pharmacy Antibiotic Note  Gregory Stone is a 73 y.o. male admitted on 12/24/2018 with sepsis.  Pharmacy has been consulted for Vancomycin and Zosyn dosing. Pt also on azithromycin.  Estimated wt ~120 lb and ht 66 in Estimated CrCl ~50 ml/min  Plan: Zosyn 3.375gm IV q8h - each dose over 4 hours Vancomycin 1000 mg IV Q 24 hrs. Goal AUC 400-550. Expected AUC: 520 SCr used: 1 Will f/u renal function, micro data, and pt's clinical condition Vanc levels prn     Temp (24hrs), Avg:97.5 F (36.4 C), Min:96.3 F (35.7 C), Max:98.6 F (37 C)  Recent Labs  Lab 12/14/2018 2102 12/26/2018 2209  WBC 12.3*  --   CREATININE 0.89  --   LATICACIDVEN  --  4.9*    CrCl cannot be calculated (Unknown ideal weight.).    Allergies  Allergen Reactions  . Lisinopril Swelling    Angioedema   . Sulfamethoxazole-Trimethoprim Anaphylaxis and Other (See Comments)    Other reaction(s): Muscle Pain   . Ticagrelor Shortness Of Breath       . Trimethoprim Other (See Comments)    Per MAR    Antimicrobials this admission: 4/27 Vanc >>  4/27 Zosyn >>  4/27 Azith>>  Microbiology results: 4/28 Covid negative 4/27 BCx:  4/27 UCx:   Sputum:   Resp panel:  Thank you for allowing pharmacy to be a part of this patient's care.  Christoper Fabian, PharmD, BCPS Clinical pharmacist  **Pharmacist phone directory can now be found on [email or web address] (PW [email or web address]).  Listed under [facility]. 12/26/2018 12:15 AM

## 2018-12-26 NOTE — Plan of Care (Signed)
  Problem: Education: Goal: Knowledge of General Education information will improve Description: Including pain rating scale, medication(s)/side effects and non-pharmacologic comfort measures Outcome: Progressing   Problem: Clinical Measurements: Goal: Ability to maintain clinical measurements within normal limits will improve Outcome: Progressing   

## 2018-12-26 NOTE — Progress Notes (Signed)
Inpatient Diabetes Program Recommendations  AACE/ADA: New Consensus Statement on Inpatient Glycemic Control (2015)  Target Ranges:  Prepandial:   less than 140 mg/dL      Peak postprandial:   less than 180 mg/dL (1-2 hours)      Critically ill patients:  140 - 180 mg/dL   Lab Results  Component Value Date   GLUCAP 138 (H) 12/26/2018   HGBA1C 7.6 (H) 12/26/2018    Review of Glycemic Control Results for Gregory, Stone (MRN [id number]) as of 12/26/2018 13:21  Ref. Range 12/26/2018 07:56 12/26/2018 12:57  Glucose-Capillary Latest Ref Range: 70 - 99 mg/dL 342 (H) 876 (H)   Diabetes history: DM 2 Outpatient Diabetes medications:  Basaglar 30 units q AM and 10 units q PM Current orders for Inpatient glycemic control:  Novolog sensitive q 4 hours Lantus 20 units q AM and Lantus 7 units q PM Solumedrol 60 mg IV tid Inpatient Diabetes Program Recommendations:   Agree with current orders.    Thanks,  Beryl Meager, RN, BC-ADM Inpatient Diabetes Coordinator Pager [phone] (8a-5p)

## 2018-12-26 NOTE — Progress Notes (Addendum)
PROGRESS NOTE    Gregory Stone  TDD:[id number] DOB: 1945/10/17 DOA: 11/30/2018 PCP: Patient, No Pcp Per   Brief Narrative: Gregory Stone is Gregory Stone 73 y.o. male with medical history significant of hyperlipidemia, diabetes mellitus, COPD, atrial fibrillation on Eliquis, depression, who presents with altered mental status and shortness of breath.  Pt has AMS and is unable to provide any medical history, therefore, most of the history is obtained by discussing the case with ED physician, per EMS report, and with the nursing staff.  Per report, pt developed shortness of breath in the facility, found to have oxygen desaturation to 50%. Pt was given breathing treatment and also treated with Gregory Stone dose of Ativan, then patient became minimally responsive. When I saw pt in ED, he is barely arousable, seems to move extremities upon painful stimuli.  Patient has respiratory distress, but not actively coughing.  No active nausea, vomiting or diarrhea noted.  Not sure if patient has any pain anywhere.  ED Course: pt was found to have negative COVID19 test, negative Flu PCR, WBC 12.3, lactic acid of 4.9, troponin 0 0.03, urinalysis (cloudy appearance, rare bacteria, WBC 11-20), potassium 5.2, creatinine 0.89, BUN 18, abnormal liver function (ALP 61, AST 137, ALT 110, total bilirubin 0.8), ammonia level 27, temperature normal, atrial fibrillation with RVR, tachypnea, oxygen saturation 100% on 5 L nasal cannula oxygen, ABG with pH 7.182, pCO2 103, PO2 142. Chest x-ray showed left upper lobe infiltration. CT head negative for acute intracranial abnormalities.  Patient is admitted to stepdown as inpatient.   Assessment & Plan:   Principal Problem:   HCAP (healthcare-associated pneumonia) Active Problems:   HLD (hyperlipidemia)   Diabetes mellitus without complication (HCC)   Depression   Atrial fibrillation with RVR (HCC)   Acute on chronic respiratory failure with hypoxia (HCC)   Acute metabolic encephalopathy    Sepsis (HCC)   Hyperkalemia   Abnormal LFTs   Elevated troponin   COPD exacerbation (HCC)   Sepsis and acute on chronic respiratory failure with hypoxia and hypercapnia due to HCAP and COPD exacerbation: Chest x-ray showed new left upper lobe infiltration.  Patient met criteria for sepsis with leukocytosis and tachycardia.  Lactic acid was elevated 4.9.  Currently hemodynamically stable. COVID19 test negative. - Coronavirus HKU1 positive - Negative Sars Cov 2  - Narrow abx to CAP coverage, as coronavirus positive above, could consider d/c abx if improving - Scheduled and prn nebs, solumedrol - Urine legionella (pending) and S. pneumococcal antigen (negative) - Follow up blood culture x2, sputum culture pending - will get Procalcitonin (0.17) - Continue bipap for persistent respiratory acidosis, follow repeat ABG  Acute metabolic encephalopathy: Likely 2/2 hypercarbia (multifactorial with sepsis and hypoxia as well). CT-head negative.  -Hold all oral medications until mental status improves.  Elevated troponin: troponin rose to 0.36.  Suspect this is due to demand ischemia with acute hypoxic respiratory failure and sepsis.  No chest pain on my exam today.  EKG at presentation with sinus tach.  No priors for comparison.  Repeat today.  - continue to follow troponin - Consider cardiology c/s if continues to rise or sx concerning for ACS - A1c 7.6, LDL 58 - Currently receiving ASA PR - Resume statin when able  Lactic Acidosis: improving, likely related to nebulizers, follow in am   HLD (hyperlipidemia): -hold lipitor  Diabetes mellitus without complication ([place]): Last A1c 9.1 on 11/11/18, poorly controled. Patient is taking glargine insulin at home -will decrease glargine insulin dose from 30-10 units  bid to 20-7 units bid - addendum: hypoglycemia - hold PM insulin, will leave AM lantus on for now, but continue to watch BG closely, consider decreasing AM dose as well -SSI q4 hrs  while NPO  Depression: -hold home oral meds  Atrial fibrillation with RVR ([place]): HR 143 initially improved to 90s currently. Likely due to sepsis. CHA2DS2-VASc Score is 2, needs oral anticoagulation. Patient is on Eliquis at home.   -hold oral Eliquis until mental status improves  Hyperkalemia: mild. K=5.2. repeat K 5.2 with mild hemolysis. -Continue IV fluid, follow-up by BMP  Abnormal LFTs: Likely due to sepsis.  Ammonia level normal at 27. - Check hepatitis panel and HIV antibody - Improving, continue to monitor  DVT prophylaxis: sCD, lovenox  Code Status: DNR Family Communication: pending, will call  Disposition Plan: pending    Consultants:   none  Procedures:   none  Antimicrobials:  Anti-infectives (From admission, onward)   Start     Dose/Rate Route Frequency Ordered Stop   12/26/18 2200  vancomycin (VANCOCIN) IVPB 1000 mg/200 mL premix  Status:  Discontinued     1,000 mg 200 mL/hr over 60 Minutes Intravenous Every 24 hours 12/26/18 0027 12/26/18 1301   12/26/18 1315  cefTRIAXone (ROCEPHIN) 1 g in sodium chloride 0.9 % 100 mL IVPB     1 g 200 mL/hr over 30 Minutes Intravenous Every 24 hours 12/26/18 1301     12/26/18 0600  piperacillin-tazobactam (ZOSYN) IVPB 3.375 g  Status:  Discontinued     3.375 g 12.5 mL/hr over 240 Minutes Intravenous Every 8 hours 12/26/18 0027 12/26/18 1301   12/26/18 0030  azithromycin (ZITHROMAX) 500 mg in sodium chloride 0.9 % 250 mL IVPB     500 mg 250 mL/hr over 60 Minutes Intravenous Daily at bedtime 12/26/18 0013     12/27/2018 2030  vancomycin (VANCOCIN) IVPB 1000 mg/200 mL premix     1,000 mg 200 mL/hr over 60 Minutes Intravenous  Once 12/24/2018 2029 12/18/2018 2208   12/28/2018 2030  piperacillin-tazobactam (ZOSYN) IVPB 3.375 g     3.375 g 100 mL/hr over 30 Minutes Intravenous  Once 12/05/2018 2029 12/08/2018 2135     Subjective: Seen on bipap Denies pain. Says he's feeling better. Difficult interview given bipap.   Objective: Vitals:   12/26/18 0330 12/26/18 0404 12/26/18 0753 12/26/18 0855  BP:   108/62 108/64  Pulse: 92  91 92  Resp: (!) 28  (!) 26 (!) 30  Temp:  (!) 97.3 F (36.3 C) 99.1 F (37.3 C)   TempSrc:  Axillary Axillary   SpO2: 100%  100% 100%  Weight:        Intake/Output Summary (Last 24 hours) at 12/26/2018 1255 Last data filed at 12/26/2018 0603 Gross per 24 hour  Intake 4813.46 ml  Output 500 ml  Net 4313.46 ml   Filed Weights   12/26/18 0100  Weight: 61.7 kg    Examination:  General exam: Appears calm and comfortable  Respiratory system: appears comfortable on bipap, no wheezing appreciated Cardiovascular system: S1 & S2 heard, RRR.  Gastrointestinal system: Abdomen is nondistended, soft and nontender.  Central nervous system: alert, able to answer questions appropriately Extremities: moving all extremities Skin: No rashes, lesions or ulcers Psychiatry: Judgement and insight appear normal. Mood & affect appropriate.     Data Reviewed: I have personally reviewed following labs and imaging studies  CBC: Recent Labs  Lab 12/26/2018 2102 12/26/18 0831  WBC 12.3* 8.6  NEUTROABS 10.8* 7.6  HGB 9.6* 8.1*  HCT 34.1* 28.1*  MCV 101.5* 99.6  PLT 250 283   Basic Metabolic Panel: Recent Labs  Lab 12/03/2018 2102 12/26/18 0042 12/26/18 0831  NA 140  --  141  K 5.2* 5.2* 5.1  CL 87*  --  93*  CO2 39*  --  43*  GLUCOSE 346*  --  282*  BUN 18  --  16  CREATININE 0.89  --  0.74  CALCIUM 9.8  --  9.2   GFR: CrCl cannot be calculated (Unknown ideal weight.). Liver Function Tests: Recent Labs  Lab 12/07/2018 2102 12/26/18 0831  AST 137* 51*  ALT 110* 83*  ALKPHOS 61 49  BILITOT 0.8 0.5  PROT 6.6 5.6*  ALBUMIN 2.5* 2.1*   No results for input(s): LIPASE, AMYLASE in the last 168 hours. Recent Labs  Lab 12/26/18 0042  AMMONIA 27   Coagulation Profile: No results for input(s): INR, PROTIME in the last 168 hours. Cardiac Enzymes: Recent Labs  Lab  12/18/2018 2102 12/26/18 0042 12/26/18 0645 12/26/18 1124  TROPONINI 0.03* 0.12* 0.31* 0.36*   BNP (last 3 results) No results for input(s): PROBNP in the last 8760 hours. HbA1C: Recent Labs    12/26/18 0320  HGBA1C 7.6*   CBG: Recent Labs  Lab 12/26/18 0756  GLUCAP 267*   Lipid Profile: Recent Labs    12/26/18 0320  CHOL 125  HDL 58  LDLCALC 58  TRIG 43  CHOLHDL 2.2   Thyroid Function Tests: No results for input(s): TSH, T4TOTAL, FREET4, T3FREE, THYROIDAB in the last 72 hours. Anemia Panel: No results for input(s): VITAMINB12, FOLATE, FERRITIN, TIBC, IRON, RETICCTPCT in the last 72 hours. Sepsis Labs: Recent Labs  Lab 12/03/2018 2209 12/26/2018 2349 12/26/18 0042 12/26/18 0320  PROCALCITON  --   --  0.17  --   LATICACIDVEN 4.9* 0.9 1.2 3.4*    Recent Results (from the past 240 hour(s))  SARS Coronavirus 2 [facility] order, Performed in [place] hospital lab)     Status: None   Collection Time: 12/24/2018  8:45 PM  Result Value Ref Range Status   SARS Coronavirus 2 NEGATIVE NEGATIVE Final    Comment: (NOTE) If result is NEGATIVE SARS-CoV-2 target nucleic acids are NOT DETECTED. The SARS-CoV-2 RNA is generally detectable in upper and lower  respiratory specimens during the acute phase of infection. The lowest  concentration of SARS-CoV-2 viral copies this assay can detect is 250  copies / mL. Gregory Stone negative result does not preclude SARS-CoV-2 infection  and should not be used as the sole basis for treatment or other  patient management decisions.  Gregory Stone negative result may occur with  improper specimen collection / handling, submission of specimen other  than nasopharyngeal swab, presence of viral mutation(s) within the  areas targeted by this assay, and inadequate number of viral copies  (<250 copies / mL). Gregory Stone negative result must be combined with clinical  observations, patient history, and epidemiological information. If result is POSITIVE SARS-CoV-2 target  nucleic acids are DETECTED. The SARS-CoV-2 RNA is generally detectable in upper and lower  respiratory specimens dur ing the acute phase of infection.  Positive  results are indicative of active infection with SARS-CoV-2.  Clinical  correlation with patient history and other diagnostic information is  necessary to determine patient infection status.  Positive results do  not rule out bacterial infection or co-infection with other viruses. If result is PRESUMPTIVE POSTIVE SARS-CoV-2 nucleic acids MAY BE PRESENT.   Gregory Stone presumptive positive  result was obtained on the submitted specimen  and confirmed on repeat testing.  While 2019 novel coronavirus  (SARS-CoV-2) nucleic acids may be present in the submitted sample  additional confirmatory testing may be necessary for epidemiological  and / or clinical management purposes  to differentiate between  SARS-CoV-2 and other Sarbecovirus currently known to infect humans.  If clinically indicated additional testing with an alternate test  methodology [phone]) is advised. The SARS-CoV-2 RNA is generally  detectable in upper and lower respiratory sp ecimens during the acute  phase of infection. The expected result is Negative. Fact Sheet for Patients:  StrictlyIdeas.no Fact Sheet for Healthcare Providers: BankingDealers.co.za This test is not yet approved or cleared by the [place] FDA and has been authorized for detection and/or diagnosis of SARS-CoV-2 by FDA under an Emergency Use Authorization (EUA).  This EUA will remain in effect (meaning this test can be used) for the duration of the COVID-19 declaration under Section 564(b)(1) of the Act, 21 U.S.C. section 360bbb-3(b)(1), unless the authorization is terminated or revoked sooner. Performed at [facility], [place] [address], [place], [place] [address]   Blood Culture (routine x 2)     Status: None (Preliminary result)   Collection  Time: 12/05/2018  8:55 PM  Result Value Ref Range Status   Specimen Description BLOOD LEFT ARM  Final   Special Requests   Final    BOTTLES DRAWN AEROBIC AND ANAEROBIC Blood Culture results may not be optimal due to an inadequate volume of blood received in culture bottles   Culture   Final    NO GROWTH < 12 HOURS Performed at [facility], [place] [address] [place], [place] [address]    Report Status PENDING  Incomplete  Blood Culture (routine x 2)     Status: None (Preliminary result)   Collection Time: 12/13/2018  9:00 PM  Result Value Ref Range Status   Specimen Description BLOOD RIGHT ARM  Final   Special Requests   Final    BOTTLES DRAWN AEROBIC AND ANAEROBIC Blood Culture results may not be optimal due to an excessive volume of blood received in culture bottles   Culture   Final    NO GROWTH < 12 HOURS Performed at [facility], [place] [address] [place],  [address]    Report Status PENDING  Incomplete  Respiratory Panel by PCR     Status: Abnormal   Collection Time: 12/26/18  1:12 AM  Result Value Ref Range Status   Adenovirus NOT DETECTED NOT DETECTED Final   Coronavirus 229E NOT DETECTED NOT DETECTED Final    Comment: (NOTE) The Coronavirus on the Respiratory Panel, DOES NOT test for the novel  Coronavirus (2019 nCoV)    Coronavirus HKU1 DETECTED (Gregory Stone) NOT DETECTED Final   Coronavirus NL63 NOT DETECTED NOT DETECTED Final   Coronavirus OC43 NOT DETECTED NOT DETECTED Final   Metapneumovirus NOT DETECTED NOT DETECTED Final   Rhinovirus / Enterovirus NOT DETECTED NOT DETECTED Final   Influenza Gregory Stone NOT DETECTED NOT DETECTED Final   Influenza B NOT DETECTED NOT DETECTED Final   Parainfluenza Virus 1 NOT DETECTED NOT DETECTED Final   Parainfluenza Virus 2 NOT DETECTED NOT DETECTED Final   Parainfluenza Virus 3 NOT DETECTED NOT DETECTED Final   Parainfluenza Virus 4 NOT DETECTED NOT DETECTED Final   Respiratory Syncytial Virus NOT DETECTED NOT DETECTED Final    Bordetella pertussis NOT DETECTED NOT DETECTED Final   Chlamydophila pneumoniae NOT DETECTED NOT  DETECTED Final   Mycoplasma pneumoniae NOT DETECTED NOT DETECTED Final    Comment: Performed at [facility], [place] [address], [place], [place] [address]         Radiology Studies: Ct Head Wo Contrast  Result Date: 12/26/2018 CLINICAL DATA:  73 year old male with ataxia, encephalopathy, atrial fibrillation. EXAM: CT HEAD WITHOUT CONTRAST TECHNIQUE: Contiguous axial images were obtained from the base of the skull through the vertex without intravenous contrast. COMPARISON:  None. FINDINGS: Brain: Cerebral volume is within normal limits for age. No midline shift, ventriculomegaly, mass effect, evidence of mass lesion, intracranial hemorrhage or evidence of cortically based acute infarction. Patchy periatrial white matter hypodensity. No cortical encephalomalacia. Vascular: Calcified atherosclerosis at the skull base. No suspicious intracranial vascular hyperdensity. Skull: Negative. Sinuses/Orbits: Visualized paranasal sinuses and mastoids are well pneumatized. Other: Negative orbits. Visualized scalp soft tissues are within normal limits. IMPRESSION: 1. No acute intracranial abnormality. 2. Mild for age nonspecific cerebral white matter changes, most commonly due to small vessel disease. Electronically Signed   By: Genevie Ann M.D.   On: 12/26/2018 01:49   Dg Chest Port 1 View  Result Date: 12/07/2018 CLINICAL DATA:  73 year old male with sepsis. EXAM: PORTABLE CHEST 1 VIEW COMPARISON:  11/26/2018 and earlier. FINDINGS: Portable AP supine view at 2057 hours. Stable large lung volumes. Stable cardiac size and mediastinal contours. Visualized tracheal air column is within normal limits. No pneumothorax, pleural effusion or consolidation. New asymmetric reticulonodular opacity in the left upper lung. No acute osseous abnormality identified. IMPRESSION: New left upper lobe reticulonodular pulmonary opacity  suspicious for acute viral/atypical respiratory infection. Underlying pulmonary hyperinflation. Electronically Signed   By: Genevie Ann M.D.   On: 12/23/2018 22:02        Scheduled Meds: . aspirin  300 mg Rectal Daily  . enoxaparin (LOVENOX) injection  40 mg Subcutaneous Q24H  . insulin aspart  0-9 Units Subcutaneous TID WC  . insulin glargine  20 Units Subcutaneous Q24H  . insulin glargine  7 Units Subcutaneous q1800  . ipratropium  0.5 mg Nebulization Q6H  . levalbuterol  1.25 mg Nebulization Q6H  . methylPREDNISolone (SOLU-MEDROL) injection  60 mg Intravenous TID   Continuous Infusions: . sodium chloride 75 mL/hr at 12/26/18 0227  . azithromycin Stopped (12/26/18 0332)  . famotidine (PEPCID) IV 20 mg (12/26/18 1111)  . piperacillin-tazobactam (ZOSYN)  IV 12.5 mL/hr at 12/26/18 0559  . vancomycin       LOS: 0 days    Time spent: over 30 min 35 min critical care time, acute hypercarbic respiratory failure, respiratory acidosis   Fayrene Helper, MD [facility] Pager AMION  If 7PM-7AM, please contact night-coverage [email or web address] Password [facility] 12/26/2018, 12:55 PM

## 2018-12-27 DIAGNOSIS — E7800 Pure hypercholesterolemia, unspecified: Secondary | ICD-10-CM

## 2018-12-27 DIAGNOSIS — F419 Anxiety disorder, unspecified: Secondary | ICD-10-CM | POA: Diagnosis present

## 2018-12-27 DIAGNOSIS — E1165 Type 2 diabetes mellitus with hyperglycemia: Secondary | ICD-10-CM

## 2018-12-27 DIAGNOSIS — E118 Type 2 diabetes mellitus with unspecified complications: Secondary | ICD-10-CM

## 2018-12-27 DIAGNOSIS — J189 Pneumonia, unspecified organism: Secondary | ICD-10-CM

## 2018-12-27 DIAGNOSIS — IMO0002 Reserved for concepts with insufficient information to code with codable children: Secondary | ICD-10-CM | POA: Diagnosis present

## 2018-12-27 DIAGNOSIS — J410 Simple chronic bronchitis: Secondary | ICD-10-CM

## 2018-12-27 DIAGNOSIS — F320 Major depressive disorder, single episode, mild: Secondary | ICD-10-CM

## 2018-12-27 DIAGNOSIS — A419 Sepsis, unspecified organism: Secondary | ICD-10-CM | POA: Diagnosis present

## 2018-12-27 LAB — CBC
HCT: 26.5 % — ABNORMAL LOW (ref 39.0–52.0)
Hemoglobin: 7.8 g/dL — ABNORMAL LOW (ref 13.0–17.0)
MCH: 28.7 pg (ref 26.0–34.0)
MCHC: 29.4 g/dL — ABNORMAL LOW (ref 30.0–36.0)
MCV: 97.4 fL (ref 80.0–100.0)
Platelets: 190 10*3/uL (ref 150–400)
RBC: 2.72 MIL/uL — ABNORMAL LOW (ref 4.22–5.81)
RDW: 15.5 % (ref 11.5–15.5)
WBC: 9.4 10*3/uL (ref 4.0–10.5)
nRBC: 0 % (ref 0.0–0.2)

## 2018-12-27 LAB — TROPONIN I: Troponin I: 0.2 ng/mL (ref <0.03)

## 2018-12-27 LAB — HEPATITIS PANEL, ACUTE
HCV Ab: 0.1 s/co ratio (ref 0.0–0.9)
Hep A IgM: NEGATIVE
Hep B C IgM: NEGATIVE
Hepatitis B Surface Ag: NEGATIVE

## 2018-12-27 LAB — GLUCOSE, CAPILLARY
Glucose-Capillary: 177 mg/dL — ABNORMAL HIGH (ref 70–99)
Glucose-Capillary: 245 mg/dL — ABNORMAL HIGH (ref 70–99)
Glucose-Capillary: 223 mg/dL — ABNORMAL HIGH (ref 70–99)
Glucose-Capillary: 255 mg/dL — ABNORMAL HIGH (ref 70–99)

## 2018-12-27 LAB — LEGIONELLA PNEUMOPHILA SEROGP 1 UR AG: L. pneumophila Serogp 1 Ur Ag: NEGATIVE

## 2018-12-27 LAB — MAGNESIUM: Magnesium: 1.9 mg/dL (ref 1.7–2.4)

## 2018-12-27 MED ORDER — INSULIN ASPART 100 UNIT/ML ~~LOC~~ SOLN
0.0000 [IU] | SUBCUTANEOUS | Status: DC
  Administered 2018-12-27: 3 [IU] via SUBCUTANEOUS
  Administered 2018-12-27: 5 [IU] via SUBCUTANEOUS
  Administered 2018-12-28: 1 [IU] via SUBCUTANEOUS
  Administered 2018-12-28: 9 [IU] via SUBCUTANEOUS
  Administered 2018-12-28: 5 [IU] via SUBCUTANEOUS
  Administered 2018-12-28: 1 [IU] via SUBCUTANEOUS

## 2018-12-27 MED ORDER — IPRATROPIUM-ALBUTEROL 0.5-2.5 (3) MG/3ML IN SOLN
3.0000 mL | Freq: Four times a day (QID) | RESPIRATORY_TRACT | Status: DC
  Administered 2018-12-27 – 2018-12-29 (×10): 3 mL via RESPIRATORY_TRACT
  Filled 2018-12-27 (×10): qty 3

## 2018-12-27 MED ORDER — IPRATROPIUM-ALBUTEROL 0.5-2.5 (3) MG/3ML IN SOLN: 3.0000 mL | Freq: Two times a day (BID) | RESPIRATORY_TRACT | Status: DC

## 2018-12-27 MED ORDER — SERTRALINE HCL 50 MG PO TABS
50.0000 mg | ORAL_TABLET | Freq: Every day | ORAL | Status: DC
  Administered 2018-12-27 – 2019-01-12 (×17): 50 mg via ORAL
  Filled 2018-12-27 (×17): qty 1

## 2018-12-27 MED ORDER — INSULIN GLARGINE 100 UNIT/ML ~~LOC~~ SOLN
25.0000 [IU] | SUBCUTANEOUS | Status: DC
  Administered 2018-12-28: 12:00:00 25 [IU] via SUBCUTANEOUS
  Filled 2018-12-27 (×3): qty 0.25

## 2018-12-27 MED ORDER — INSULIN GLARGINE 100 UNIT/ML ~~LOC~~ SOLN
5.0000 [IU] | Freq: Once | SUBCUTANEOUS | Status: AC
  Administered 2018-12-27: 5 [IU] via SUBCUTANEOUS
  Filled 2018-12-27: qty 0.05

## 2018-12-27 MED ORDER — APIXABAN 5 MG PO TABS
5.0000 mg | ORAL_TABLET | Freq: Two times a day (BID) | ORAL | Status: DC
  Administered 2018-12-27 – 2019-01-12 (×32): 5 mg via ORAL
  Filled 2018-12-27 (×32): qty 1

## 2018-12-27 NOTE — TOC Progression Note (Signed)
Transition of Care [facility]) - Progression Note    Patient Details  Name: Gregory Stone MRN: [id number] Date of Birth: Feb 10, 1946  Transition of Care [facility]) CM/SW Contact  Eduard Roux, [place] Phone Number: 12/27/2018, 2:48 PM  Clinical Narrative:    CSW  Returned call to patient's daughter, Gregory Stone- CSW left voice mail to return the call.   Antony Blackbird, MSW, [facility] Clinical Social Worker [phone]    Expected Discharge Plan: Skilled Nursing Facility Barriers to Discharge: Continued Medical Work up  Expected Discharge Plan and Services Expected Discharge Plan: Skilled Nursing Facility       Living arrangements for the past 2 months: Skilled Nursing Facility                                       Social Determinants of Health (SDOH) Interventions    Readmission Risk Interventions No flowsheet data found.

## 2018-12-27 NOTE — Progress Notes (Signed)
PROGRESS NOTE    Gregory Stone  IRW:[id number] DOB: 1945-09-17 DOA: 12/19/2018 PCP: Patient, No Pcp Per   Brief Narrative:  Gregory Stone is a 73 y.o. 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   Pt has AMS and is unable to provide any medical history, therefore, most of the history is obtained by discussing the case with ED physician, per EMS report, and with the nursing staff.   Per report, pt developed shortness of breath in the facility, found to have oxygen desaturation to 50%. Pt was given breathing treatment and also treated with a dose of Ativan, then patient became minimally responsive. When I saw pt in ED, he is barely arousable, seems to move extremities upon painful stimuli.  Patient has respiratory distress, but not actively coughing.  No active nausea, vomiting or diarrhea noted.  Not sure if patient has any pain anywhere.   ED Course: pt was found to have negative COVID19 test, negative Flu PCR, WBC 12.3, lactic acid of 4.9, troponin 0 0.03, urinalysis (cloudy appearance, rare bacteria, WBC 11-20), potassium 5.2, creatinine 0.89, BUN 18, abnormal liver function (ALP 61, AST 137, ALT 110, total bilirubin 0.8), ammonia level 27, temperature normal, atrial fibrillation with RVR, tachypnea, oxygen saturation 100% on 5 L nasal cannula oxygen, ABG with pH 7.182, pCO2 103, PO2 142. Chest x-ray showed left upper lobe infiltration. CT head negative for acute intracranial abnormalities.  Patient is admitted to stepdown as inpatient.    Subjective: 4/29 A/O x3 (did not know why).  Able to follow all commands.   Assessment & Plan:   Principal Problem:   HCAP (healthcare-associated pneumonia) Active Problems:   HLD (hyperlipidemia)   Diabetes mellitus without complication (HCC)   Depression   COPD (chronic obstructive pulmonary  disease) (HCC)   Atrial fibrillation with RVR (HCC)   Acute on chronic respiratory failure with hypoxia (HCC)   Acute metabolic encephalopathy   Sepsis (HCC)   Hyperkalemia   Abnormal LFTs   Elevated troponin   COPD exacerbation (HCC)   Sepsis due to pneumonia (HCC)   Diabetes mellitus type 2, uncontrolled, with complications ([place])   Anxiety   Sepsis pneumonia/HCAP - CXR showed LUL infiltration. - On admission met criteria for sepsis, leukocytosis and tachycardia.  Lactic acid elevated 4.9 - COVID 19 negative - Positive Coronavirus H KU 1 -Urine Legionella pending, urine strep pneumo negative -Complete 7 days antibiotics - Solu-Medrol 60 mg TID, will decrease to daily in the a.m. if patient continues to improve. - DuoNeb QID - Albuterol neb as needed   COPD exacerbation - See sepsis pneumonia   Respiratory failure with hypoxia and hypercapnia -Resolved -See sepsis pneumonia - Titrate O2 to maintain SPO2 89 to 76%  Acute metabolic encephalopathy -Multifactorial sepsis, hypoxia. - Appears to be resolving continue current treatment  A. fib with RVR -CHA2DS2-VASc=2 - Most likely secondary to sepsis -Currently NSR - 4/29 restart home Eliquis.  Discontinue Lovenox   Elevated troponin - Most likely demand ischemia troponins rose to 0.36.  Patient asymptomatic on exam. -EKG at presentation sinus tachycardia no previous EKG for comparison Recent Labs  Lab 12/26/18 0645 12/26/18 1124 12/26/18 1438 12/26/18 2030 12/27/18 0300  TROPONINI 0.31* 0.36* 0.31* 0.28* 0.20*  -Troponin resolving  Diabetes type 2 uncontrolled with complication - 1/95 Hemoglobin A1c= 7.6 -4/29 Lantus 25 units daily - 4/29 Lantus 5 units x  1 dose - Sensitive SSI.  CBG QAC/QHS -Lipid panel pending  Lactic acidosis - Resolved    Depression/anxiety - Zoloft 50 mg daily -Hold home Ativan.  Hyperkalemia -Resolved monitor closely  Abnormal LFTs -Most likely secondary sepsis will follow          DVT prophylaxis: Eliquis Code Status: DNR Family Communication: None Disposition Plan: TBD   Consultants:  None  Procedures/Significant Events:  None   I have personally reviewed and interpreted all radiology studies and my findings are as above.  VENTILATOR SETTINGS: None   Cultures 4/27 blood LEFT arm NGTD 4/27 blood RIGHT arm NGTD 4/28 SARS coronavirus2 negative 4/28 respiratory virus panel positive for coronavirus H KU 1 4/28 influenza A/B negative 4/28 hepatitis panel negative 4/28 HIV negative 4/28 urine insignificant growth 4/28 MRSA by PCR negative      Antimicrobials: Anti-infectives (From admission, onward)   Start     Stop   12/26/18 2200  vancomycin (VANCOCIN) IVPB 1000 mg/200 mL premix  Status:  Discontinued     12/26/18 1301   12/26/18 1430  cefTRIAXone (ROCEPHIN) 2 g in sodium chloride 0.9 % 100 mL IVPB         12/26/18 0600  piperacillin-tazobactam (ZOSYN) IVPB 3.375 g  Status:  Discontinued     12/26/18 1301   12/26/18 0030  azithromycin (ZITHROMAX) 500 mg in sodium chloride 0.9 % 250 mL IVPB         11/29/2018 2030  vancomycin (VANCOCIN) IVPB 1000 mg/200 mL premix     12/14/2018 2208   11/29/2018 2030  piperacillin-tazobactam (ZOSYN) IVPB 3.375 g     12/09/2018 2135       Devices   LINES / TUBES:      Continuous Infusions: . sodium chloride 20 mL/hr at 12/27/18 0359  . azithromycin 500 mg (12/26/18 2124)  . cefTRIAXone (ROCEPHIN)  IV 2 g (12/27/18 1459)  . famotidine (PEPCID) IV 20 mg (12/27/18 1204)     Objective: Vitals:   12/27/18 0857 12/27/18 1330 12/27/18 1515 12/27/18 1600  BP:   124/77   Pulse:   96 90  Resp:   (!) 35 (!) 24  Temp:      TempSrc:   Oral   SpO2: 96% 94% 100% 98%  Weight:        Intake/Output Summary (Last 24 hours) at 12/27/2018 1738 Last data filed at 12/27/2018 0400 Gross per 24 hour  Intake 1793.91 ml  Output 950 ml  Net 843.91 ml   Filed Weights   12/26/18 0100  Weight: 61.7 kg     Examination:  General: A/O x3 (did not know why) no acute respiratory distress, cachectic Eyes: negative scleral hemorrhage, negative anisocoria, negative icterus ENT: Negative Runny nose, negative gingival bleeding, Neck:  Negative scars, masses, torticollis, lymphadenopathy, JVD Lungs: Clear to auscultation bilaterally without wheezes or crackles Cardiovascular: Regular rate and rhythm without murmur gallop or rub normal S1 and S2 Abdomen: negative abdominal pain, nondistended, positive soft, bowel sounds, no rebound, no ascites, no appreciable mass Extremities: No significant cyanosis, clubbing, or edema bilateral lower extremities Skin: Negative rashes, lesions, ulcers Psychiatric:  Negative depression, negative anxiety, negative fatigue, negative mania  Central nervous system:  Cranial nerves II through XII intact, tongue/uvula midline, all extremities muscle strength 5/5, sensation intact throughout, finger nose finger bilateral within normal limits (but slow), quick finger touch bilateral within normal limits (slow), negative dysarthria, negative expressive aphasia, negative receptive aphasia.  .     Data Reviewed: Care during  the described time interval was provided by me .  I have reviewed this patient's available data, including medical history, events of note, physical examination, and all test results as part of my evaluation.   CBC: Recent Labs  Lab 12/23/2018 2102 12/26/18 0831 12/27/18 0300  WBC 12.3* 8.6 9.4  NEUTROABS 10.8* 7.6  --   HGB 9.6* 8.1* 7.8*  HCT 34.1* 28.1* 26.5*  MCV 101.5* 99.6 97.4  PLT 250 194 677   Basic Metabolic Panel: Recent Labs  Lab 12/21/2018 2102 12/26/18 0042 12/26/18 0831 12/26/18 2030 12/27/18 0300  NA 140  --  141 140  --   K 5.2* 5.2* 5.1 4.9  --   CL 87*  --  93* 92*  --   CO2 39*  --  43* 39*  --   GLUCOSE 346*  --  282* 167*  --   BUN 18  --  16 17  --   CREATININE 0.89  --  0.74 0.75  --   CALCIUM 9.8  --  9.2 9.4  --    MG  --   --   --   --  1.9   GFR: CrCl cannot be calculated (Unknown ideal weight.). Liver Function Tests: Recent Labs  Lab 12/23/2018 2102 12/26/18 0831 12/26/18 2030  AST 137* 51* 37  ALT 110* 83* 72*  ALKPHOS 61 49 54  BILITOT 0.8 0.5 0.4  PROT 6.6 5.6* 5.9*  ALBUMIN 2.5* 2.1* 2.3*   No results for input(s): LIPASE, AMYLASE in the last 168 hours. Recent Labs  Lab 12/26/18 0042  AMMONIA 27   Coagulation Profile: No results for input(s): INR, PROTIME in the last 168 hours. Cardiac Enzymes: Recent Labs  Lab 12/26/18 0645 12/26/18 1124 12/26/18 1438 12/26/18 2030 12/27/18 0300  TROPONINI 0.31* 0.36* 0.31* 0.28* 0.20*   BNP (last 3 results) No results for input(s): PROBNP in the last 8760 hours. HbA1C: Recent Labs    12/26/18 0320  HGBA1C 7.6*   CBG: Recent Labs  Lab 12/26/18 2056 12/27/18 0438 12/27/18 0832 12/27/18 1214 12/27/18 1648  GLUCAP 201* 177* 245* 223* 255*   Lipid Profile: Recent Labs    12/26/18 0320  CHOL 125  HDL 58  LDLCALC 58  TRIG 43  CHOLHDL 2.2   Thyroid Function Tests: No results for input(s): TSH, T4TOTAL, FREET4, T3FREE, THYROIDAB in the last 72 hours. Anemia Panel: No results for input(s): VITAMINB12, FOLATE, FERRITIN, TIBC, IRON, RETICCTPCT in the last 72 hours. Urine analysis:    Component Value Date/Time   COLORURINE AMBER (A) 12/12/2018 2036   APPEARANCEUR CLOUDY (A) 12/10/2018 2036   LABSPEC 1.020 11/30/2018 2036   PHURINE 5.0 12/13/2018 2036   GLUCOSEU >=500 (A) 12/17/2018 2036   HGBUR NEGATIVE 12/22/2018 2036   [place] NEGATIVE 12/07/2018 2036   KETONESUR NEGATIVE 12/09/2018 2036   PROTEINUR 100 (A) 12/27/2018 2036   NITRITE NEGATIVE 12/07/2018 2036   LEUKOCYTESUR NEGATIVE 12/26/2018 2036   Sepsis Labs: '@LABRCNTIP' (procalcitonin:4,lacticidven:4)  ) Recent Results (from the past 240 hour(s))  SARS Coronavirus 2 [facility] order, Performed in [place] hospital lab)     Status: None   Collection  Time: 12/10/2018  8:45 PM  Result Value Ref Range Status   SARS Coronavirus 2 NEGATIVE NEGATIVE Final    Comment: (NOTE) If result is NEGATIVE SARS-CoV-2 target nucleic acids are NOT DETECTED. The SARS-CoV-2 RNA is generally detectable in upper and lower  respiratory specimens during the acute phase of infection. The lowest  concentration of SARS-CoV-2  viral copies this assay can detect is 250  copies / mL. A negative result does not preclude SARS-CoV-2 infection  and should not be used as the sole basis for treatment or other  patient management decisions.  A negative result may occur with  improper specimen collection / handling, submission of specimen other  than nasopharyngeal swab, presence of viral mutation(s) within the  areas targeted by this assay, and inadequate number of viral copies  (<250 copies / mL). A negative result must be combined with clinical  observations, patient history, and epidemiological information. If result is POSITIVE SARS-CoV-2 target nucleic acids are DETECTED. The SARS-CoV-2 RNA is generally detectable in upper and lower  respiratory specimens dur ing the acute phase of infection.  Positive  results are indicative of active infection with SARS-CoV-2.  Clinical  correlation with patient history and other diagnostic information is  necessary to determine patient infection status.  Positive results do  not rule out bacterial infection or co-infection with other viruses. If result is PRESUMPTIVE POSTIVE SARS-CoV-2 nucleic acids MAY BE PRESENT.   A presumptive positive result was obtained on the submitted specimen  and confirmed on repeat testing.  While 2019 novel coronavirus  (SARS-CoV-2) nucleic acids may be present in the submitted sample  additional confirmatory testing may be necessary for epidemiological  and / or clinical management purposes  to differentiate between  SARS-CoV-2 and other Sarbecovirus currently known to infect humans.  If  clinically indicated additional testing with an alternate test  methodology [phone]) is advised. The SARS-CoV-2 RNA is generally  detectable in upper and lower respiratory sp ecimens during the acute  phase of infection. The expected result is Negative. Fact Sheet for Patients:  StrictlyIdeas.no Fact Sheet for Healthcare Providers: BankingDealers.co.za This test is not yet approved or cleared by the [place] FDA and has been authorized for detection and/or diagnosis of SARS-CoV-2 by FDA under an Emergency Use Authorization (EUA).  This EUA will remain in effect (meaning this test can be used) for the duration of the COVID-19 declaration under Section 564(b)(1) of the Act, 21 U.S.C. section 360bbb-3(b)(1), unless the authorization is terminated or revoked sooner. Performed at [facility], [place] [address] [place], [place] [address]   Blood Culture (routine x 2)     Status: None (Preliminary result)   Collection Time: 12/20/2018  8:55 PM  Result Value Ref Range Status   Specimen Description BLOOD LEFT ARM  Final   Special Requests   Final    BOTTLES DRAWN AEROBIC AND ANAEROBIC Blood Culture results may not be optimal due to an inadequate volume of blood received in culture bottles   Culture   Final    NO GROWTH 2 DAYS Performed at [facility], [place] [address], [place], [place] [address]    Report Status PENDING  Incomplete  Blood Culture (routine x 2)     Status: None (Preliminary result)   Collection Time: 12/28/2018  9:00 PM  Result Value Ref Range Status   Specimen Description BLOOD RIGHT ARM  Final   Special Requests   Final    BOTTLES DRAWN AEROBIC AND ANAEROBIC Blood Culture results may not be optimal due to an excessive volume of blood received in culture bottles   Culture   Final    NO GROWTH 2 DAYS Performed at [facility], [place] [address], [place], [place] [address]    Report Status PENDING  Incomplete   Respiratory Panel by PCR  Status: Abnormal   Collection Time: 12/26/18  1:12 AM  Result Value Ref Range Status   Adenovirus NOT DETECTED NOT DETECTED Final   Coronavirus 229E NOT DETECTED NOT DETECTED Final    Comment: (NOTE) The Coronavirus on the Respiratory Panel, DOES NOT test for the novel  Coronavirus (2019 nCoV)    Coronavirus HKU1 DETECTED (A) NOT DETECTED Final   Coronavirus NL63 NOT DETECTED NOT DETECTED Final   Coronavirus OC43 NOT DETECTED NOT DETECTED Final   Metapneumovirus NOT DETECTED NOT DETECTED Final   Rhinovirus / Enterovirus NOT DETECTED NOT DETECTED Final   Influenza A NOT DETECTED NOT DETECTED Final   Influenza B NOT DETECTED NOT DETECTED Final   Parainfluenza Virus 1 NOT DETECTED NOT DETECTED Final   Parainfluenza Virus 2 NOT DETECTED NOT DETECTED Final   Parainfluenza Virus 3 NOT DETECTED NOT DETECTED Final   Parainfluenza Virus 4 NOT DETECTED NOT DETECTED Final   Respiratory Syncytial Virus NOT DETECTED NOT DETECTED Final   Bordetella pertussis NOT DETECTED NOT DETECTED Final   Chlamydophila pneumoniae NOT DETECTED NOT DETECTED Final   Mycoplasma pneumoniae NOT DETECTED NOT DETECTED Final    Comment: Performed at [facility], [address] [place], [place] [address]  Urine culture     Status: Abnormal   Collection Time: 12/26/18  2:59 AM  Result Value Ref Range Status   Specimen Description URINE, CLEAN CATCH  Final   Special Requests NONE  Final   Culture (A)  Final    <10,000 COLONIES/mL INSIGNIFICANT GROWTH Performed at [facility], [place] [address], [place], [place] [address]    Report Status 12/26/2018 FINAL  Final  MRSA PCR Screening     Status: None   Collection Time: 12/26/18  4:45 PM  Result Value Ref Range Status   MRSA by PCR NEGATIVE NEGATIVE Final    Comment:        The GeneXpert MRSA Assay (FDA approved for NASAL specimens only), is one component of a comprehensive MRSA colonization surveillance program. It is not  intended to diagnose MRSA infection nor to guide or monitor treatment for MRSA infections. Performed at [facility], [place] [address], [place], [place] [address]          Radiology Studies: Ct Head Wo Contrast  Result Date: 12/26/2018 CLINICAL DATA:  73 year old male with ataxia, encephalopathy, atrial fibrillation. EXAM: CT HEAD WITHOUT CONTRAST TECHNIQUE: Contiguous axial images were obtained from the base of the skull through the vertex without intravenous contrast. COMPARISON:  None. FINDINGS: Brain: Cerebral volume is within normal limits for age. No midline shift, ventriculomegaly, mass effect, evidence of mass lesion, intracranial hemorrhage or evidence of cortically based acute infarction. Patchy periatrial white matter hypodensity. No cortical encephalomalacia. Vascular: Calcified atherosclerosis at the skull base. No suspicious intracranial vascular hyperdensity. Skull: Negative. Sinuses/Orbits: Visualized paranasal sinuses and mastoids are well pneumatized. Other: Negative orbits. Visualized scalp soft tissues are within normal limits. IMPRESSION: 1. No acute intracranial abnormality. 2. Mild for age nonspecific cerebral white matter changes, most commonly due to small vessel disease. Electronically Signed   By: Genevie Ann M.D.   On: 12/26/2018 01:49   Dg Chest Port 1 View  Result Date: 12/15/2018 CLINICAL DATA:  73 year old male with sepsis. EXAM: PORTABLE CHEST 1 VIEW COMPARISON:  11/26/2018 and earlier. FINDINGS: Portable AP supine view at 2057 hours. Stable large lung volumes. Stable cardiac size and mediastinal contours. Visualized tracheal air column is within normal limits. No pneumothorax, pleural effusion  or consolidation. New asymmetric reticulonodular opacity in the left upper lung. No acute osseous abnormality identified. IMPRESSION: New left upper lobe reticulonodular pulmonary opacity suspicious for acute viral/atypical respiratory infection. Underlying pulmonary  hyperinflation. Electronically Signed   By: Genevie Ann M.D.   On: 12/14/2018 22:02        Scheduled Meds: . apixaban  5 mg Oral BID  . aspirin  300 mg Rectal Daily  . insulin aspart  0-9 Units Subcutaneous Q4H  . [START ON 12/28/2018] insulin glargine  25 Units Subcutaneous Q24H  . insulin glargine  5 Units Subcutaneous Once  . ipratropium-albuterol  3 mL Nebulization Q6H  . methylPREDNISolone (SOLU-MEDROL) injection  60 mg Intravenous TID  . sertraline  50 mg Oral Daily   Continuous Infusions: . sodium chloride 20 mL/hr at 12/27/18 0359  . azithromycin 500 mg (12/26/18 2124)  . cefTRIAXone (ROCEPHIN)  IV 2 g (12/27/18 1459)  . famotidine (PEPCID) IV 20 mg (12/27/18 1204)     LOS: 1 day   Time spent: 40 minutes     Ayush Boulet, Geraldo Docker, MD [facility] Pager [phone]  If 7PM-7AM, please contact night-coverage [email or web address] Password [facility] 12/27/2018, 5:38 PM

## 2018-12-27 NOTE — Progress Notes (Addendum)
Inpatient Diabetes Program Recommendations  AACE/ADA: New Consensus Statement on Inpatient Glycemic Control (2015)  Target Ranges:  Prepandial:   less than 140 mg/dL      Peak postprandial:   less than 180 mg/dL (1-2 hours)      Critically ill patients:  140 - 180 mg/dL   Lab Results  Component Value Date   GLUCAP 245 (H) 12/27/2018   HGBA1C 7.6 (H) 12/26/2018   Results for Gregory, Stone (MRN [id number]) as of 12/27/2018 11:49  Ref. Range 12/26/2018 07:56 12/26/2018 12:57 12/26/2018 17:05 12/26/2018 17:22 12/26/2018 20:56 12/27/2018 04:38 12/27/2018 08:32  Glucose-Capillary Latest Ref Range: 70 - 99 mg/dL 970 (H)  Novolog 5 units 138 (H)  Novolog 1 unit 49 (L) 129 (H) 201 (H)  Novolog 2 units  177 (H)  Novolog 2 units  245 (H)  Novolog 2 units Note     Review of Glycemic Control  Diabetes history: DM2  Outpatient Diabetes medications: Basaglar 30 units q AM and 10 units q PM  Current orders for Inpatient glycemic control: Novolog sensitive (0-9 units) Q4 hours                                                                           Lantus 20 units Q 24 hours  Solumedrol 60 mg TID    Note patient dropped to 49mg /dl yesterday at 2637. More likely due to the Novolog than the Lantus. Yesterday had order for Lantus BID (20 units am and 7 units pm) though the pm dose was d/c prior to being given.     As CBG remain above target today recommend increasing basal insulin. Patient remains on steroids leading to increased CBG. As steroids are decreased insulin needs likely will be reduced.   MD please consider following inpatient insulin adjustments:  1. Increase to Lantus 25 units daily to start tomorrow am  2. One time order for Lantus 5 units now (to equal new dose as already received daily dose today)  3. Decrease Novolog sensitive (0-9 units) correction frequency to tid ac and (0-5 units) hs as patient is eating   Thank you.  -- Will follow during  hospitalization.--  Jamelle Rushing RN, MSN Diabetes Coordinator Inpatient Glycemic Control Team Team Pager: [phone] (8am-5pm)

## 2018-12-28 LAB — BLOOD CULTURE ID PANEL (REFLEXED)

## 2018-12-28 LAB — CBC
HCT: 27.9 % — ABNORMAL LOW (ref 39.0–52.0)
Hemoglobin: 8 g/dL — ABNORMAL LOW (ref 13.0–17.0)
MCH: 28.3 pg (ref 26.0–34.0)
MCHC: 28.7 g/dL — ABNORMAL LOW (ref 30.0–36.0)
MCV: 98.6 fL (ref 80.0–100.0)
Platelets: 185 10*3/uL (ref 150–400)
RBC: 2.83 MIL/uL — ABNORMAL LOW (ref 4.22–5.81)
RDW: 15.1 % (ref 11.5–15.5)
WBC: 6.8 10*3/uL (ref 4.0–10.5)
nRBC: 0 % (ref 0.0–0.2)

## 2018-12-28 LAB — BASIC METABOLIC PANEL
Anion gap: 6 (ref 5–15)
BUN: 15 mg/dL (ref 8–23)
CO2: 38 mmol/L — ABNORMAL HIGH (ref 22–32)
Calcium: 9.2 mg/dL (ref 8.9–10.3)
Chloride: 95 mmol/L — ABNORMAL LOW (ref 98–111)
Creatinine, Ser: 0.54 mg/dL — ABNORMAL LOW (ref 0.61–1.24)
GFR calc Af Amer: >60 mL/min (ref >60)
GFR calc non Af Amer: >60 mL/min (ref >60)
Glucose, Bld: 147 mg/dL — ABNORMAL HIGH (ref 70–99)
Potassium: 4.3 mmol/L (ref 3.5–5.1)
Sodium: 139 mmol/L (ref 135–145)

## 2018-12-28 LAB — LIPID PANEL
Cholesterol: 137 mg/dL (ref 0–200)
HDL: 57 mg/dL (ref >40)
LDL Cholesterol: 66 mg/dL (ref 0–99)
Total CHOL/HDL Ratio: 2.4 RATIO
Triglycerides: 68 mg/dL (ref <150)
VLDL: 14 mg/dL (ref 0–40)

## 2018-12-28 LAB — GLUCOSE, CAPILLARY
Glucose-Capillary: 137 mg/dL — ABNORMAL HIGH (ref 70–99)
Glucose-Capillary: 147 mg/dL — ABNORMAL HIGH (ref 70–99)
Glucose-Capillary: 275 mg/dL — ABNORMAL HIGH (ref 70–99)
Glucose-Capillary: 282 mg/dL — ABNORMAL HIGH (ref 70–99)
Glucose-Capillary: 298 mg/dL — ABNORMAL HIGH (ref 70–99)
Glucose-Capillary: 431 mg/dL — ABNORMAL HIGH (ref 70–99)

## 2018-12-28 LAB — MAGNESIUM: Magnesium: 1.7 mg/dL (ref 1.7–2.4)

## 2018-12-28 MED ORDER — ATORVASTATIN CALCIUM 10 MG PO TABS
20.0000 mg | ORAL_TABLET | Freq: Every day | ORAL | Status: DC
  Administered 2018-12-28 – 2019-01-10 (×14): 20 mg via ORAL
  Filled 2018-12-28 (×14): qty 2

## 2018-12-28 MED ORDER — DM-GUAIFENESIN ER 30-600 MG PO TB12
1.0000 | ORAL_TABLET | Freq: Two times a day (BID) | ORAL | Status: DC
  Administered 2018-12-29 – 2019-01-12 (×29): 1 via ORAL
  Filled 2018-12-28 (×28): qty 1

## 2018-12-28 MED ORDER — MAGNESIUM SULFATE 2 GM/50ML IV SOLN
2.0000 g | Freq: Once | INTRAVENOUS | Status: AC
  Administered 2018-12-28: 2 g via INTRAVENOUS
  Filled 2018-12-28: qty 50

## 2018-12-28 MED ORDER — INSULIN GLARGINE 100 UNIT/ML ~~LOC~~ SOLN
30.0000 [IU] | SUBCUTANEOUS | Status: DC
  Administered 2018-12-29 – 2018-12-30 (×2): 30 [IU] via SUBCUTANEOUS
  Filled 2018-12-28 (×5): qty 0.3

## 2018-12-28 MED ORDER — METHYLPREDNISOLONE SODIUM SUCC 125 MG IJ SOLR
60.0000 mg | Freq: Two times a day (BID) | INTRAMUSCULAR | Status: DC
  Administered 2018-12-28 – 2018-12-30 (×4): 60 mg via INTRAVENOUS
  Filled 2018-12-28 (×4): qty 2

## 2018-12-28 MED ORDER — INSULIN ASPART 100 UNIT/ML ~~LOC~~ SOLN
0.0000 [IU] | SUBCUTANEOUS | Status: DC
  Administered 2018-12-28 (×2): 11 [IU] via SUBCUTANEOUS
  Administered 2018-12-29: 17:00:00 7 [IU] via SUBCUTANEOUS
  Administered 2018-12-29: 09:00:00 3 [IU] via SUBCUTANEOUS
  Administered 2018-12-29 (×2): 4 [IU] via SUBCUTANEOUS
  Administered 2018-12-29: 01:00:00 15 [IU] via SUBCUTANEOUS
  Administered 2018-12-29: 7 [IU] via SUBCUTANEOUS
  Administered 2018-12-30 (×2): 4 [IU] via SUBCUTANEOUS
  Administered 2018-12-30: 01:00:00 3 [IU] via SUBCUTANEOUS
  Administered 2018-12-30: 4 [IU] via SUBCUTANEOUS
  Administered 2018-12-31: 7 [IU] via SUBCUTANEOUS
  Administered 2018-12-31: 01:00:00 4 [IU] via SUBCUTANEOUS
  Administered 2018-12-31: 05:00:00 3 [IU] via SUBCUTANEOUS
  Administered 2018-12-31: 7 [IU] via SUBCUTANEOUS
  Administered 2019-01-01: 11 [IU] via SUBCUTANEOUS
  Administered 2019-01-02: 4 [IU] via SUBCUTANEOUS
  Administered 2019-01-02 (×2): 3 [IU] via SUBCUTANEOUS
  Administered 2019-01-02: 09:00:00 4 [IU] via SUBCUTANEOUS
  Administered 2019-01-02: 17:00:00 11 [IU] via SUBCUTANEOUS
  Administered 2019-01-02: 4 [IU] via SUBCUTANEOUS
  Administered 2019-01-03: 20 [IU] via SUBCUTANEOUS
  Administered 2019-01-03: 09:00:00 4 [IU] via SUBCUTANEOUS

## 2018-12-28 MED ORDER — ENSURE MAX PROTEIN PO LIQD
11.0000 [oz_av] | Freq: Every day | ORAL | Status: DC
  Administered 2018-12-29 – 2019-01-11 (×10): 11 [oz_av] via ORAL
  Filled 2018-12-28 (×16): qty 330

## 2018-12-28 MED ORDER — PRO-STAT SUGAR FREE PO LIQD
30.0000 mL | Freq: Two times a day (BID) | ORAL | Status: DC
  Administered 2018-12-29 – 2019-01-12 (×26): 30 mL via ORAL
  Filled 2018-12-28 (×26): qty 30

## 2018-12-28 NOTE — Progress Notes (Signed)
PHARMACY - PHYSICIAN COMMUNICATION CRITICAL VALUE ALERT - BLOOD CULTURE IDENTIFICATION (BCID)  Gregory Stone is an 73 y.o. male who presented to [facility] on 12/10/2018 with a chief complaint of AMS/SOB, transitioned from vanc/zosyn 4/28 to ceftriaxone/azithro.    Assessment:  1/4 bottles with coag negative staph, VSS, AF, WBC wnl, likely contaminant.   Name of physician (or Provider) Contacted: Craige Cotta  Current antibiotics: Azithromycin, ceftriaxone  Changes to prescribed antibiotics recommended:  None  Results for orders placed or performed during the hospital encounter of 12/16/2018  Blood Culture ID Panel (Reflexed) (Collected: 12/07/2018  8:55 PM)  Result Value Ref Range   Enterococcus species NOT DETECTED NOT DETECTED   Listeria monocytogenes NOT DETECTED NOT DETECTED   Staphylococcus species DETECTED (A) NOT DETECTED   Staphylococcus aureus (BCID) NOT DETECTED NOT DETECTED   Methicillin resistance DETECTED (A) NOT DETECTED   Streptococcus species NOT DETECTED NOT DETECTED   Streptococcus agalactiae NOT DETECTED NOT DETECTED   Streptococcus pneumoniae NOT DETECTED NOT DETECTED   Streptococcus pyogenes NOT DETECTED NOT DETECTED   Acinetobacter baumannii NOT DETECTED NOT DETECTED   Enterobacteriaceae species NOT DETECTED NOT DETECTED   Enterobacter cloacae complex NOT DETECTED NOT DETECTED   Escherichia coli NOT DETECTED NOT DETECTED   Klebsiella oxytoca NOT DETECTED NOT DETECTED   Klebsiella pneumoniae NOT DETECTED NOT DETECTED   Proteus species NOT DETECTED NOT DETECTED   Serratia marcescens NOT DETECTED NOT DETECTED   Haemophilus influenzae NOT DETECTED NOT DETECTED   Neisseria meningitidis NOT DETECTED NOT DETECTED   Pseudomonas aeruginosa NOT DETECTED NOT DETECTED   Candida albicans NOT DETECTED NOT DETECTED   Candida glabrata NOT DETECTED NOT DETECTED   Candida krusei NOT DETECTED NOT DETECTED   Candida parapsilosis NOT DETECTED NOT DETECTED   Candida tropicalis NOT  DETECTED NOT DETECTED    [name] 12/28/2018  12:25 AM

## 2018-12-28 NOTE — Progress Notes (Addendum)
Inpatient Diabetes Program Recommendations  AACE/ADA: New Consensus Statement on Inpatient Glycemic Control (2015)  Target Ranges:  Prepandial:   less than 140 mg/dL      Peak postprandial:   less than 180 mg/dL (1-2 hours)      Critically ill patients:  140 - 180 mg/dL   Lab Results  Component Value Date   GLUCAP 137 (H) 12/28/2018   HGBA1C 7.6 (H) 12/26/2018      Review of Glycemic Control  Diabetes history: DM2  Outpatient Diabetes medications:Basaglar 30 units q AM and 10 units q PM    Current orders for Inpatient glycemic control: Lantus 25 units Q 24 hours (increased yesterday)                                                                          Novolog sensitive correction (0-9 units) Q4 hours    Solumedrol 60 mg TID (per MD note may decrease today if continues to improve)    As patient has a diet order and is eating recommend reducing frequency of CBG checks/ Novolog correction.     MD please consider following inpatient insulin adjustments:  Change Novolog sensitive (0-9 units) correction to tid ac and (0-5 units) hs.    Thank you.  -- Will follow during hospitalization.--  Jamelle Rushing RN, MSN Diabetes Coordinator Inpatient Glycemic Control Team Team Pager: [phone] (8am-5pm)

## 2018-12-28 NOTE — Progress Notes (Signed)
Patient taken off BiPAP and placed on 3LNC per home settings. No distress noted. RT to monitor

## 2018-12-28 NOTE — Progress Notes (Signed)
PROGRESS NOTE    Gregory Stone  ZOX:[id number] DOB: 29-Nov-1945 DOA: 12/09/2018 PCP: Patient, No Pcp Per   Brief Narrative:  Gregory Stone is a 73 y.o. BM PMHx  hyperlipidemia, diabetes mellitus type II uncontrolled with complication, COPD on 3 L O2 at home, atrial fibrillation on Eliquis, depression,    Presents with altered mental status and shortness of breath.   Pt has AMS and is unable to provide any medical history, therefore, most of the history is obtained by discussing the case with ED physician, per EMS report, and with the nursing staff.   Per report, pt developed shortness of breath in the facility, found to have oxygen desaturation to 50%. Pt was given breathing treatment and also treated with a dose of Ativan, then patient became minimally responsive. When I saw pt in ED, he is barely arousable, seems to move extremities upon painful stimuli.  Patient has respiratory distress, but not actively coughing.  No active nausea, vomiting or diarrhea noted.  Not sure if patient has any pain anywhere.   ED Course: pt was found to have negative COVID19 test, negative Flu PCR, WBC 12.3, lactic acid of 4.9, troponin 0 0.03, urinalysis (cloudy appearance, rare bacteria, WBC 11-20), potassium 5.2, creatinine 0.89, BUN 18, abnormal liver function (ALP 61, AST 137, ALT 110, total bilirubin 0.8), ammonia level 27, temperature normal, atrial fibrillation with RVR, tachypnea, oxygen saturation 100% on 5 L nasal cannula oxygen, ABG with pH 7.182, pCO2 103, PO2 142. Chest x-ray showed left upper lobe infiltration. CT head negative for acute intracranial abnormalities.  Patient is admitted to stepdown as inpatient.    Subjective: 4/30 A/O x4, able to follow all commands negative CP, negative abdominal pain, positive acute on chronic S OB    Assessment & Plan:   Principal Problem:   HCAP (healthcare-associated pneumonia) Active Problems:   HLD (hyperlipidemia)   Diabetes mellitus without  complication (HCC)   Depression   COPD (chronic obstructive pulmonary disease) (HCC)   Atrial fibrillation with RVR (HCC)   Acute on chronic respiratory failure with hypoxia (HCC)   Acute metabolic encephalopathy   Sepsis (HCC)   Hyperkalemia   Abnormal LFTs   Elevated troponin   COPD exacerbation (HCC)   Sepsis due to pneumonia ([place])   Diabetes mellitus type 2, uncontrolled, with complications ([place])   Anxiety   Sepsis pneumonia/HCAP - CXR showed LUL infiltration. - On admission met criteria for sepsis, leukocytosis and tachycardia.  Lactic acid elevated 4.9 - COVID 19 negative - Positive Coronavirus H KU 1 -Urine strep pneumo/Legionella negative -Complete 7 days antibiotics - 4/30 decrease Solu-Medrol 60 mg to BID - DuoNeb QID - Albuterol neb as needed -Flutter valve -Mucinex DM twice daily - 4/30 PT/OT: Evaluate for CIR  Vs SNF -Out of bed every shift  COPD exacerbation - See sepsis pneumonia  Respiratory failure with hypoxia and hypercapnia -Resolved -See sepsis pneumonia - Titrate O2 to maintain SPO2 89 to 81%  Acute metabolic encephalopathy -Multifactorial sepsis, hypoxia. - Resolved  A. fib with RVR -CHA2DS2-VASc=2 - Most likely secondary to sepsis -Currently NSR - 4/29 restart home Eliquis.  Discontinue Lovenox   Elevated troponin - Most likely demand ischemia troponins rose to 0.36.  Patient asymptomatic on exam. -EKG at presentation sinus tachycardia no previous EKG for comparison Recent Labs  Lab 12/26/18 0645 12/26/18 1124 12/26/18 1438 12/26/18 2030 12/27/18 0300  TROPONINI 0.31* 0.36* 0.31* 0.28* 0.20*  -Troponin resolving  Diabetes type 2 uncontrolled with complication - 1/91 Hemoglobin  A1c= 7.6 -4/30 Lantus 30 units daily - 4/30 resistant SSI   HLD - LDL at the ADA guidelines -4/30 start Lipitor  Lactic acidosis - Resolved    Depression/anxiety - Zoloft 50 mg daily -Hold home Ativan.  Hyperkalemia -Resolved monitor closely   Hypomagnesmia - Magnesium goal> 2 - Magnesium 2 g  Abnormal LFTs -Most likely secondary sepsis will follow         DVT prophylaxis: Eliquis Code Status: DNR Family Communication: None Disposition Plan: TBD   Consultants:  None  Procedures/Significant Events:  None   I have personally reviewed and interpreted all radiology studies and my findings are as above.  VENTILATOR SETTINGS: None   Cultures 4/27 blood LEFT arm NGTD 4/27 blood RIGHT arm NGTD 4/28 SARS coronavirus2 negative 4/28 strep pneumo urine antigen negative 4/28 Legionella urine antigen negative 4/28 respiratory virus panel positive for coronavirus H KU 1 4/28 influenza A/B negative 4/28 hepatitis panel negative 4/28 HIV negative 4/28 urine insignificant growth 4/28 MRSA by PCR negative      Antimicrobials: Anti-infectives (From admission, onward)   Start     Stop   12/26/18 2200  vancomycin (VANCOCIN) IVPB 1000 mg/200 mL premix  Status:  Discontinued     12/26/18 1301   12/26/18 1430  cefTRIAXone (ROCEPHIN) 2 g in sodium chloride 0.9 % 100 mL IVPB         12/26/18 0600  piperacillin-tazobactam (ZOSYN) IVPB 3.375 g  Status:  Discontinued     12/26/18 1301   12/26/18 0030  azithromycin (ZITHROMAX) 500 mg in sodium chloride 0.9 % 250 mL IVPB         12/06/2018 2030  vancomycin (VANCOCIN) IVPB 1000 mg/200 mL premix     12/11/2018 2208   12/14/2018 2030  piperacillin-tazobactam (ZOSYN) IVPB 3.375 g     12/02/2018 2135       Devices   LINES / TUBES:      Continuous Infusions: . sodium chloride 75 mL/hr at 12/28/18 0600  . azithromycin 500 mg (12/27/18 2113)  . cefTRIAXone (ROCEPHIN)  IV 2 g (12/27/18 1459)  . famotidine (PEPCID) IV 20 mg (12/28/18 1231)     Objective: Vitals:   12/28/18 0317 12/28/18 0456 12/28/18 0816 12/28/18 0818  BP:  137/72    Pulse:  67    Resp:      Temp:  (!) 97.5 F (36.4 C)    TempSrc:  Oral    SpO2: 100% 100% 99% 99%  Weight:  64.7 kg       Intake/Output Summary (Last 24 hours) at 12/28/2018 1232 Last data filed at 12/28/2018 0600 Gross per 24 hour  Intake 1968.55 ml  Output -  Net 1968.55 ml   Filed Weights   12/26/18 0100 12/28/18 0456  Weight: 61.7 kg 64.7 kg   General: A/O x4 positive acute on chronicrespiratory distress, cachectic Eyes: negative scleral hemorrhage, negative anisocoria, negative icterus ENT: Negative Runny nose, negative gingival bleeding, Neck:  Negative scars, masses, torticollis, lymphadenopathy, JVD Lungs: Decreased breath sounds diffusely, mild expiratory wheezes, negativer crackles Cardiovascular: Regular rate and rhythm without murmur gallop or rub normal S1 and S2 Abdomen: negative abdominal pain, nondistended, positive soft, bowel sounds, no rebound, no ascites, no appreciable mass Extremities: No significant cyanosis, clubbing, or edema bilateral lower extremities Skin: Negative rashes, lesions, ulcers Psychiatric:  Negative depression, negative anxiety, negative fatigue, negative mania  Central nervous system:  Cranial nerves II through XII intact, tongue/uvula midline, all extremities muscle strength 5/5, sensation intact throughout, negative  dysarthria, negative expressive aphasia, negative receptive aphasia.   .     Data Reviewed: Care during the described time interval was provided by me .  I have reviewed this patient's available data, including medical history, events of note, physical examination, and all test results as part of my evaluation.   CBC: Recent Labs  Lab 11/30/2018 2102 12/26/18 0831 12/27/18 0300 12/28/18 0345  WBC 12.3* 8.6 9.4 6.8  NEUTROABS 10.8* 7.6  --   --   HGB 9.6* 8.1* 7.8* 8.0*  HCT 34.1* 28.1* 26.5* 27.9*  MCV 101.5* 99.6 97.4 98.6  PLT 250 194 190 616   Basic Metabolic Panel: Recent Labs  Lab 12/04/2018 2102 12/26/18 0042 12/26/18 0831 12/26/18 2030 12/27/18 0300 12/28/18 0345  NA 140  --  141 140  --  139  K 5.2* 5.2* 5.1 4.9  --  4.3  CL  87*  --  93* 92*  --  95*  CO2 39*  --  43* 39*  --  38*  GLUCOSE 346*  --  282* 167*  --  147*  BUN 18  --  16 17  --  15  CREATININE 0.89  --  0.74 0.75  --  0.54*  CALCIUM 9.8  --  9.2 9.4  --  9.2  MG  --   --   --   --  1.9 1.7   GFR: CrCl cannot be calculated (Unknown ideal weight.). Liver Function Tests: Recent Labs  Lab 12/27/2018 2102 12/26/18 0831 12/26/18 2030  AST 137* 51* 37  ALT 110* 83* 72*  ALKPHOS 61 49 54  BILITOT 0.8 0.5 0.4  PROT 6.6 5.6* 5.9*  ALBUMIN 2.5* 2.1* 2.3*   No results for input(s): LIPASE, AMYLASE in the last 168 hours. Recent Labs  Lab 12/26/18 0042  AMMONIA 27   Coagulation Profile: No results for input(s): INR, PROTIME in the last 168 hours. Cardiac Enzymes: Recent Labs  Lab 12/26/18 0645 12/26/18 1124 12/26/18 1438 12/26/18 2030 12/27/18 0300  TROPONINI 0.31* 0.36* 0.31* 0.28* 0.20*   BNP (last 3 results) No results for input(s): PROBNP in the last 8760 hours. HbA1C: Recent Labs    12/26/18 0320  HGBA1C 7.6*   CBG: Recent Labs  Lab 12/27/18 1648 12/28/18 0008 12/28/18 0500 12/28/18 0755 12/28/18 1157  GLUCAP 255* 275* 147* 137* 431*   Lipid Profile: Recent Labs    12/26/18 0320 12/28/18 0345  CHOL 125 137  HDL 58 57  LDLCALC 58 66  TRIG 43 68  CHOLHDL 2.2 2.4   Thyroid Function Tests: No results for input(s): TSH, T4TOTAL, FREET4, T3FREE, THYROIDAB in the last 72 hours. Anemia Panel: No results for input(s): VITAMINB12, FOLATE, FERRITIN, TIBC, IRON, RETICCTPCT in the last 72 hours. Urine analysis:    Component Value Date/Time   COLORURINE AMBER (A) 12/05/2018 2036   APPEARANCEUR CLOUDY (A) 12/06/2018 2036   LABSPEC 1.020 12/20/2018 2036   PHURINE 5.0 12/01/2018 2036   GLUCOSEU >=500 (A) 12/11/2018 2036   HGBUR NEGATIVE 12/13/2018 2036   [place] NEGATIVE 12/16/2018 2036   [place] NEGATIVE 12/02/2018 2036   PROTEINUR 100 (A) 12/05/2018 2036   NITRITE NEGATIVE 12/01/2018 2036   LEUKOCYTESUR  NEGATIVE 12/04/2018 2036   Sepsis Labs: _0 (procalcitonin:4,lacticidven:4)  ) Recent Results (from the past 240 hour(s))  SARS Coronavirus 2 [facility] order, Performed in [place] hospital lab)     Status: None   Collection Time: 12/20/2018  8:45 PM  Result Value Ref Range Status   SARS  Coronavirus 2 NEGATIVE NEGATIVE Final    Comment: (NOTE) If result is NEGATIVE SARS-CoV-2 target nucleic acids are NOT DETECTED. The SARS-CoV-2 RNA is generally detectable in upper and lower  respiratory specimens during the acute phase of infection. The lowest  concentration of SARS-CoV-2 viral copies this assay can detect is 250  copies / mL. A negative result does not preclude SARS-CoV-2 infection  and should not be used as the sole basis for treatment or other  patient management decisions.  A negative result may occur with  improper specimen collection / handling, submission of specimen other  than nasopharyngeal swab, presence of viral mutation(s) within the  areas targeted by this assay, and inadequate number of viral copies  (<250 copies / mL). A negative result must be combined with clinical  observations, patient history, and epidemiological information. If result is POSITIVE SARS-CoV-2 target nucleic acids are DETECTED. The SARS-CoV-2 RNA is generally detectable in upper and lower  respiratory specimens dur ing the acute phase of infection.  Positive  results are indicative of active infection with SARS-CoV-2.  Clinical  correlation with patient history and other diagnostic information is  necessary to determine patient infection status.  Positive results do  not rule out bacterial infection or co-infection with other viruses. If result is PRESUMPTIVE POSTIVE SARS-CoV-2 nucleic acids MAY BE PRESENT.   A presumptive positive result was obtained on the submitted specimen  and confirmed on repeat testing.  While 2019 novel coronavirus  (SARS-CoV-2) nucleic acids may be present in  the submitted sample  additional confirmatory testing may be necessary for epidemiological  and / or clinical management purposes  to differentiate between  SARS-CoV-2 and other Sarbecovirus currently known to infect humans.  If clinically indicated additional testing with an alternate test  methodology [phone]) is advised. The SARS-CoV-2 RNA is generally  detectable in upper and lower respiratory sp ecimens during the acute  phase of infection. The expected result is Negative. Fact Sheet for Patients:  StrictlyIdeas.no Fact Sheet for Healthcare Providers: BankingDealers.co.za This test is not yet approved or cleared by the [place] FDA and has been authorized for detection and/or diagnosis of SARS-CoV-2 by FDA under an Emergency Use Authorization (EUA).  This EUA will remain in effect (meaning this test can be used) for the duration of the COVID-19 declaration under Section 564(b)(1) of the Act, 21 U.S.C. section 360bbb-3(b)(1), unless the authorization is terminated or revoked sooner. Performed at [facility], [place] [address] [place], [place] [address]   Blood Culture (routine x 2)     Status: Abnormal (Preliminary result)   Collection Time: 12/15/2018  8:55 PM  Result Value Ref Range Status   Specimen Description BLOOD LEFT ARM  Final   Special Requests   Final    BOTTLES DRAWN AEROBIC AND ANAEROBIC Blood Culture results may not be optimal due to an inadequate volume of blood received in culture bottles   Culture  Setup Time   Final    GRAM POSITIVE COCCI AEROBIC BOTTLE ONLY CRITICAL RESULT CALLED TO, READ BACK BY AND VERIFIED WITH: Derry Lory [facility] 12/28/18 0013 JDW Performed at [facility], [place] [address], [place], [place] [address]    Culture STAPHYLOCOCCUS SPECIES (COAGULASE NEGATIVE) (A)  Final   Report Status PENDING  Incomplete  Blood Culture ID Panel (Reflexed)     Status: Abnormal   Collection Time:  12/18/2018  8:55 PM  Result Value Ref Range Status   Enterococcus species NOT DETECTED NOT DETECTED  Final   Listeria monocytogenes NOT DETECTED NOT DETECTED Final   Staphylococcus species DETECTED (A) NOT DETECTED Final    Comment: Methicillin (oxacillin) resistant coagulase negative staphylococcus. Possible blood culture contaminant (unless isolated from more than one blood culture draw or clinical case suggests pathogenicity). No antibiotic treatment is indicated for blood  culture contaminants.    Staphylococcus aureus (BCID) NOT DETECTED NOT DETECTED Final   Methicillin resistance DETECTED (A) NOT DETECTED Final    Comment: CRITICAL RESULT CALLED TO, READ BACK BY AND VERIFIED WITH: J ORIET PHARMD 12/28/18 0013 JDW    Streptococcus species NOT DETECTED NOT DETECTED Final   Streptococcus agalactiae NOT DETECTED NOT DETECTED Final   Streptococcus pneumoniae NOT DETECTED NOT DETECTED Final   Streptococcus pyogenes NOT DETECTED NOT DETECTED Final   Acinetobacter baumannii NOT DETECTED NOT DETECTED Final   Enterobacteriaceae species NOT DETECTED NOT DETECTED Final   Enterobacter cloacae complex NOT DETECTED NOT DETECTED Final   Escherichia coli NOT DETECTED NOT DETECTED Final   Klebsiella oxytoca NOT DETECTED NOT DETECTED Final   Klebsiella pneumoniae NOT DETECTED NOT DETECTED Final   Proteus species NOT DETECTED NOT DETECTED Final   Serratia marcescens NOT DETECTED NOT DETECTED Final   Haemophilus influenzae NOT DETECTED NOT DETECTED Final   Neisseria meningitidis NOT DETECTED NOT DETECTED Final   Pseudomonas aeruginosa NOT DETECTED NOT DETECTED Final   Candida albicans NOT DETECTED NOT DETECTED Final   Candida glabrata NOT DETECTED NOT DETECTED Final   Candida krusei NOT DETECTED NOT DETECTED Final   Candida parapsilosis NOT DETECTED NOT DETECTED Final   Candida tropicalis NOT DETECTED NOT DETECTED Final    Comment: Performed at [facility], [place]. [address] [place], [place]  [address]  Blood Culture (routine x 2)     Status: None (Preliminary result)   Collection Time: 12/12/2018  9:00 PM  Result Value Ref Range Status   Specimen Description BLOOD RIGHT ARM  Final   Special Requests   Final    BOTTLES DRAWN AEROBIC AND ANAEROBIC Blood Culture results may not be optimal due to an excessive volume of blood received in culture bottles   Culture   Final    NO GROWTH 3 DAYS Performed at [facility], [place] [address], [place],  [address]    Report Status PENDING  Incomplete  Respiratory Panel by PCR     Status: Abnormal   Collection Time: 12/26/18  1:12 AM  Result Value Ref Range Status   Adenovirus NOT DETECTED NOT DETECTED Final   Coronavirus 229E NOT DETECTED NOT DETECTED Final    Comment: (NOTE) The Coronavirus on the Respiratory Panel, DOES NOT test for the novel  Coronavirus (2019 nCoV)    Coronavirus HKU1 DETECTED (A) NOT DETECTED Final   Coronavirus NL63 NOT DETECTED NOT DETECTED Final   Coronavirus OC43 NOT DETECTED NOT DETECTED Final   Metapneumovirus NOT DETECTED NOT DETECTED Final   Rhinovirus / Enterovirus NOT DETECTED NOT DETECTED Final   Influenza A NOT DETECTED NOT DETECTED Final   Influenza B NOT DETECTED NOT DETECTED Final   Parainfluenza Virus 1 NOT DETECTED NOT DETECTED Final   Parainfluenza Virus 2 NOT DETECTED NOT DETECTED Final   Parainfluenza Virus 3 NOT DETECTED NOT DETECTED Final   Parainfluenza Virus 4 NOT DETECTED NOT DETECTED Final   Respiratory Syncytial Virus NOT DETECTED NOT DETECTED Final   Bordetella pertussis NOT DETECTED NOT DETECTED Final   Chlamydophila pneumoniae NOT DETECTED NOT DETECTED Final   Mycoplasma pneumoniae  NOT DETECTED NOT DETECTED Final    Comment: Performed at [facility], [place] [address] [place], [place] [address]  Urine culture     Status: Abnormal   Collection Time: 12/26/18  2:59 AM  Result Value Ref Range Status   Specimen Description URINE, CLEAN CATCH  Final   Special Requests  NONE  Final   Culture (A)  Final    <10,000 COLONIES/mL INSIGNIFICANT GROWTH Performed at [facility], [place] [address], [place], [place] [address]    Report Status 12/26/2018 FINAL  Final  MRSA PCR Screening     Status: None   Collection Time: 12/26/18  4:45 PM  Result Value Ref Range Status   MRSA by PCR NEGATIVE NEGATIVE Final    Comment:        The GeneXpert MRSA Assay (FDA approved for NASAL specimens only), is one component of a comprehensive MRSA colonization surveillance program. It is not intended to diagnose MRSA infection nor to guide or monitor treatment for MRSA infections. Performed at [facility], [place] [address], [place], [place] [address]          Radiology Studies: No results found.      Scheduled Meds: . apixaban  5 mg Oral BID  . aspirin  300 mg Rectal Daily  . insulin aspart  0-9 Units Subcutaneous Q4H  . insulin glargine  25 Units Subcutaneous Q24H  . ipratropium-albuterol  3 mL Nebulization Q6H  . methylPREDNISolone (SOLU-MEDROL) injection  60 mg Intravenous TID  . sertraline  50 mg Oral Daily   Continuous Infusions: . sodium chloride 75 mL/hr at 12/28/18 0600  . azithromycin 500 mg (12/27/18 2113)  . cefTRIAXone (ROCEPHIN)  IV 2 g (12/27/18 1459)  . famotidine (PEPCID) IV 20 mg (12/28/18 1231)     LOS: 2 days   Time spent: 40 minutes     WOODS, Geraldo Docker, MD [facility] Pager [phone]  If 7PM-7AM, please contact night-coverage [email or web address] Password [facility] 12/28/2018, 12:32 PM

## 2018-12-29 DIAGNOSIS — J189 Pneumonia, unspecified organism: Secondary | ICD-10-CM

## 2018-12-29 HISTORY — DX: Pneumonia, unspecified organism: J18.9

## 2018-12-29 LAB — CBC
HCT: 30.1 % — ABNORMAL LOW (ref 39.0–52.0)
Hemoglobin: 9.2 g/dL — ABNORMAL LOW (ref 13.0–17.0)
MCH: 30.3 pg (ref 26.0–34.0)
MCHC: 30.6 g/dL (ref 30.0–36.0)
MCV: 99 fL (ref 80.0–100.0)
Platelets: 210 10*3/uL (ref 150–400)
RBC: 3.04 MIL/uL — ABNORMAL LOW (ref 4.22–5.81)
RDW: 15.8 % — ABNORMAL HIGH (ref 11.5–15.5)
WBC: 13.1 10*3/uL — ABNORMAL HIGH (ref 4.0–10.5)
nRBC: 3.3 % — ABNORMAL HIGH (ref 0.0–0.2)

## 2018-12-29 LAB — BASIC METABOLIC PANEL
Anion gap: 8 (ref 5–15)
BUN: 26 mg/dL — ABNORMAL HIGH (ref 8–23)
CO2: 38 mmol/L — ABNORMAL HIGH (ref 22–32)
Calcium: 9.4 mg/dL (ref 8.9–10.3)
Chloride: 94 mmol/L — ABNORMAL LOW (ref 98–111)
Creatinine, Ser: 0.73 mg/dL (ref 0.61–1.24)
GFR calc Af Amer: >60 mL/min (ref >60)
GFR calc non Af Amer: >60 mL/min (ref >60)
Glucose, Bld: 294 mg/dL — ABNORMAL HIGH (ref 70–99)
Potassium: 4.9 mmol/L (ref 3.5–5.1)
Sodium: 140 mmol/L (ref 135–145)

## 2018-12-29 LAB — GLUCOSE, CAPILLARY
Glucose-Capillary: 143 mg/dL — ABNORMAL HIGH (ref 70–99)
Glucose-Capillary: 161 mg/dL — ABNORMAL HIGH (ref 70–99)
Glucose-Capillary: 187 mg/dL — ABNORMAL HIGH (ref 70–99)
Glucose-Capillary: 225 mg/dL — ABNORMAL HIGH (ref 70–99)
Glucose-Capillary: 229 mg/dL — ABNORMAL HIGH (ref 70–99)
Glucose-Capillary: 301 mg/dL — ABNORMAL HIGH (ref 70–99)

## 2018-12-29 LAB — CULTURE, BLOOD (ROUTINE X 2)

## 2018-12-29 LAB — MAGNESIUM: Magnesium: 2.1 mg/dL (ref 1.7–2.4)

## 2018-12-29 MED ORDER — ASPIRIN EC 81 MG PO TBEC
81.0000 mg | DELAYED_RELEASE_TABLET | Freq: Every day | ORAL | Status: DC
  Administered 2018-12-29 – 2019-01-12 (×15): 81 mg via ORAL
  Filled 2018-12-29 (×15): qty 1

## 2018-12-29 NOTE — TOC Initial Note (Addendum)
Transition of Care [facility]) - Initial/Assessment Note    Patient Details  Name: Gregory Stone MRN: [id number] Date of Birth: [birth year]  [facility] CM/SW Contact:    Gwenlyn Fudge, LCSWA Phone Number: 12/29/2018, 10:57 AM  Clinical Narrative:                  CSW called and spoke with the patient's daughter, Lucendia Herrlich. She would like her father to return to [facility] for skilled nursing rehab. She had no problems with it. She reported that her father had no issues with [facility].   CSW spoke with the patient at bedside. He is in agreement with going back to [facility]. The patient had concerns with not having his dentures, glasses, and his cell phone. The patient reported that those items were at a facility in [place], [place]. CSW stated that she would follow up with the patient's daughter about these items and that we would get them to him soon. Patient had no other concerns or issues.   Expected Discharge Plan: Skilled Nursing Facility Barriers to Discharge: Continued Medical Work up   Patient Goals and CMS Choice Patient states their goals for this hospitalization and ongoing recovery are:: Pt daughter would like her father to return to [facility] and finish rehab.  CMS Medicare.gov Compare Post Acute Care list provided to:: Other (Comment Required)(Pt to return back to [facility]) Choice offered to / list presented to : NA  Expected Discharge Plan and Services Expected Discharge Plan: Skilled Nursing Facility In-house Referral: Clinical Social Work Discharge Planning Services: NA Post Acute Care Choice: Skilled Nursing Facility Living arrangements for the past 2 months: Skilled Nursing Facility                 DME Arranged: N/A DME Agency: NA       HH Arranged: NA HH Agency: NA        Prior Living Arrangements/Services Living arrangements for the past 2 months: Skilled Nursing Facility Lives with:: Facility Resident Patient language and need for interpreter  reviewed:: No Do you feel safe going back to the place where you live?: Yes      Need for Family Participation in Patient Care: Yes (Comment) Care giver support system in place?: Yes (comment)   Criminal Activity/Legal Involvement Pertinent to Current Situation/Hospitalization: No - Comment as needed  Activities of Daily Living      Permission Sought/Granted Permission sought to share information with : Case Manager Permission granted to share information with : Yes, Verbal Permission Granted  Share Information with NAME: Lucendia Herrlich  Permission granted to share info w AGENCY: [facility]  Permission granted to share info w Relationship: Daughter  Permission granted to share info w Contact Information: [phone]  Emotional Assessment Appearance:: Appears stated age Attitude/Demeanor/Rapport: Unable to Assess Affect (typically observed): Unable to Assess Orientation: : Oriented to Self, Oriented to Place, Oriented to Situation, Oriented to  Time Alcohol / Substance Use: Not Applicable Psych Involvement: No (comment)  Admission diagnosis:  HCAP (healthcare-associated pneumonia) [J18.9] Acute on chronic respiratory failure with hypoxia and hypercapnia (HCC) [D62.22, J96.22] Patient Active Problem List   Diagnosis Date Noted  . Sepsis due to pneumonia (HCC) 12/27/2018  . Diabetes mellitus type 2, uncontrolled, with complications (HCC) 12/27/2018  . Anxiety 12/27/2018  . COPD exacerbation (HCC) 12/26/2018  . Pressure injury of skin 12/26/2018  . COPD (chronic obstructive pulmonary disease) (HCC) 20-Jan-2019  . Atrial fibrillation with RVR (HCC) 2019-01-20  . Acute on chronic respiratory failure with hypoxia (  HCC) 12/05/2018  . Acute metabolic encephalopathy 11/30/2018  . Sepsis (HCC) 12/02/2018  . Hyperkalemia 12/12/2018  . Abnormal LFTs 12/09/2018  . Elevated troponin 12/03/2018  . HCAP (healthcare-associated pneumonia) 12/21/2018  . HLD (hyperlipidemia)   . Diabetes mellitus  without complication (HCC)   . Depression    PCP:  Patient, No Pcp Per Pharmacy:  No Pharmacies Listed    Social Determinants of Health (SDOH) Interventions    Readmission Risk Interventions Readmission Risk Prevention Plan 12/29/2018  Transportation Screening Complete  PCP or Specialist Appt within 5-7 Days Complete  Home Care Screening Complete  Medication Review (RN CM) Complete

## 2018-12-29 NOTE — Evaluation (Signed)
Occupational Therapy Evaluation Patient Details Name: Gregory Stone MRN: [id number] DOB: [birth year] Today's Date: 12/29/2018    History of Present Illness 73 yo admitted from [facility] with AMS and SOB with HCAP covid (-). PMhx: COPD on home O2, HLD, DM, Afib, depression   Clinical Impression   Pt awakened for evaluation. Reports needing help for ADL, with exception of self feeding and walking with assistance and use of RW at SNF. Pt presents with generalized weakness, impaired cognition and decreased balance. He requires min-mod assist for bed mobility and min assist for basic transfers with RW. Agree with plan to return to SNF for further rehab. Will follow acutely.    Follow Up Recommendations  SNF;Supervision/Assistance - 24 hour    Equipment Recommendations  None recommended by OT    Recommendations for Other Services       Precautions / Restrictions Precautions Precautions: Fall Precaution Comments: home O2 3L Restrictions Weight Bearing Restrictions: No      Mobility Bed Mobility Overal bed mobility: Needs Assistance Bed Mobility: Supine to Sit;Sit to Supine     Supine to sit: Min assist;HOB elevated Sit to supine: Mod assist   General bed mobility comments: min assist to bring legs on and off EOB with HOB up, use of rail, multimodal cues and increased time  Transfers Overall transfer level: Needs assistance Equipment used: Rolling walker (2 wheeled) Transfers: Sit to/from [employer] Sit to Stand: Min guard Stand pivot transfers: Min assist       General transfer comment: cues for hand placment with increased time to rise and min assist to control and direct RW for pivot to chair.     Balance Overall balance assessment: Needs assistance Sitting-balance support: Feet supported Sitting balance-Leahy Scale: Fair     Standing balance support: Bilateral upper extremity supported Standing balance-Leahy Scale: Poor Standing balance  comment: unable to release walker in static standing                           ADL either performed or assessed with clinical judgement   ADL Overall ADL's : Needs assistance/impaired Eating/Feeding: Set up;Sitting   Grooming: Wash/dry hands;Wash/dry face;Sitting;Set up   Upper Body Bathing: Moderate assistance;Sitting   Lower Body Bathing: Sit to/from stand;Maximal assistance   Upper Body Dressing : Minimal assistance;Sitting   Lower Body Dressing: Sit to/from stand;Maximal assistance   Toilet Transfer: Minimal assistance;Stand-pivot;BSC   Toileting- Clothing Manipulation and Hygiene: Total assistance;Sit to/from stand               Vision Patient Visual Report: No change from baseline       Perception     Praxis      Pertinent Vitals/Pain Pain Assessment: No/denies pain     Hand Dominance Right   Extremity/Trunk Assessment Upper Extremity Assessment Upper Extremity Assessment: Generalized weakness   Lower Extremity Assessment Lower Extremity Assessment: Defer to PT evaluation   Cervical / Trunk Assessment Cervical / Trunk Assessment: Normal   Communication Communication Communication: No difficulties   Cognition Arousal/Alertness: Awake/alert Behavior During Therapy: Flat affect Overall Cognitive Status: Impaired/Different from baseline Area of Impairment: Orientation;Attention;Memory;Problem solving;Following commands;Safety/judgement                 Orientation Level: Disoriented to;Time Current Attention Level: Sustained Memory: Decreased short-term memory Following Commands: Follows one step commands with increased time;Follows one step commands inconsistently Safety/Judgement: Decreased awareness of safety;Decreased awareness of deficits   Problem Solving: Slow processing;Decreased  initiation;Difficulty sequencing;Requires verbal cues;Requires tactile cues    General Comments       Exercises    Shoulder Instructions       Home Living Family/patient expects to be discharged to:: Skilled nursing facility                                        Prior Functioning/Environment Level of Independence: Needs assistance  Gait / Transfers Assistance Needed: pt reports he walks some with a RW and assist unable to qualify distance ADL's / Homemaking Assistance Needed: assist for bathing and dressing takes WC to dining hall, self feeds            OT Problem List: Decreased strength;Decreased activity tolerance;Impaired balance (sitting and/or standing);Decreased cognition;Decreased knowledge of use of DME or AE;Decreased safety awareness      OT Treatment/Interventions:      OT Goals(Current goals can be found in the care plan section) Acute Rehab OT Goals Patient Stated Goal: feel better OT Goal Formulation: With patient Time For Goal Achievement: 01/12/19 Potential to Achieve Goals: Good ADL Goals Pt Will Perform Grooming: with min assist;standing Pt Will Perform Upper Body Bathing: with min assist;sitting Pt Will Perform Upper Body Dressing: with set-up;sitting Pt Will Transfer to Toilet: with min assist;ambulating;bedside commode  OT Frequency:     Barriers to D/C:            Co-evaluation              AM-PAC OT "6 Clicks" Daily Activity     Outcome Measure Help from another person eating meals?: A Little Help from another person taking care of personal grooming?: A Little Help from another person toileting, which includes using toliet, bedpan, or urinal?: A Lot Help from another person bathing (including washing, rinsing, drying)?: A Lot Help from another person to put on and taking off regular upper body clothing?: A Little Help from another person to put on and taking off regular lower body clothing?: A Lot 6 Click Score: 15   End of Session Equipment Utilized During Treatment: Gait belt;Rolling walker  Activity Tolerance: Patient tolerated treatment well Patient  left: in bed;with call bell/phone within reach;with bed alarm set  OT Visit Diagnosis: Other abnormalities of gait and mobility (R26.89);Muscle weakness (generalized) (M62.81);Unsteadiness on feet (R26.81)                Time: [id number] OT Time Calculation (min): 17 min Charges:  OT General Charges $OT Visit: 1 Visit OT Evaluation $OT Eval Moderate Complexity: 1 Mod  Martie RoundJulie Rio Taber, OTR/L Acute Rehabilitation Services Pager: [phone] Office: [phone]  Evern BioMayberry, Minette Manders Lynn 12/29/2018, 3:01 PM

## 2018-12-29 NOTE — [place] FL2 (Signed)
[place] MEDICAID FL2 LEVEL OF CARE SCREENING TOOL     IDENTIFICATION  Patient Name: Gregory Stone Birthdate: [birth year] Sex: male Admission Date (Current Location): Feb 03, 2019  [facility] [place]   Facility and Address:  [facility], [address], [place], [place] [address]      Provider Number: [id number]  Attending Physician Name and Address:  Marzetta BoardSpongberg, Christopher N*  Relative Name and Phone Number:  [name] Gord, Daughter, [phone]    Current Level of Care: Hospital Recommended Level of Care: Skilled Nursing Facility Prior Approval Number:    Date Approved/Denied: 06/18/15 PASRR Number: [id number]  Discharge Plan: SNF    Current Diagnoses: Patient Active Problem List   Diagnosis Date Noted  . Sepsis due to pneumonia (HCC) 12/27/2018  . Diabetes mellitus type 2, uncontrolled, with complications (HCC) 12/27/2018  . Anxiety 12/27/2018  . COPD exacerbation (HCC) 12/26/2018  . Pressure injury of skin 12/26/2018  . COPD (chronic obstructive pulmonary disease) (HCC) 0Jun 06, 2020  . Atrial fibrillation with RVR (HCC) 0Jun 06, 2020  . Acute on chronic respiratory failure with hypoxia (HCC) 0Jun 06, 2020  . Acute metabolic encephalopathy 0Jun 06, 2020  . Sepsis (HCC) 0Jun 06, 2020  . Hyperkalemia 0Jun 06, 2020  . Abnormal LFTs 0Jun 06, 2020  . Elevated troponin 0Jun 06, 2020  . HCAP (healthcare-associated pneumonia) 0Jun 06, 2020  . HLD (hyperlipidemia)   . Diabetes mellitus without complication (HCC)   . Depression     Orientation RESPIRATION BLADDER Height & Weight     Self, Time, Situation, Place  O2, Other (Comment)(95, [place], 5L; Bipap- adult face mask, set rate 8 and rep rate 31) Incontinent, External catheter Weight: 148 lb 12.8 oz (67.5 kg) Height:     BEHAVIORAL SYMPTOMS/MOOD NEUROLOGICAL BOWEL NUTRITION STATUS      Continent Diet(heart healthy/carb mod, thin liquids)  AMBULATORY STATUS COMMUNICATION OF NEEDS Skin   Limited Assist  Verbally Normal, Skin abrasions(Dry skin, pressure injury on sacrum/PRN dressings)                       Personal Care Assistance Level of Assistance  Bathing, Feeding, Dressing, Total care Bathing Assistance: Limited assistance Feeding assistance: Independent Dressing Assistance: Limited assistance Total Care Assistance: Limited assistance   Functional Limitations Info  Sight, Hearing, Speech Sight Info: Adequate Hearing Info: Adequate Speech Info: Adequate(Has dentures)    SPECIAL CARE FACTORS FREQUENCY  PT (By licensed PT), OT (By licensed OT)     PT Frequency: 5x/wk OT Frequency: 5x/wk            Contractures Contractures Info: Not present    Additional Factors Info  Code Status, Allergies, Insulin Sliding Scale, Psychotropic Code Status Info: DNR Allergies Info:  Lisinopril, Sulfamethoxazole-trimethoprim, Ticagrelor, Trimethoprim Psychotropic Info: zoloft 50 mg daily Insulin Sliding Scale Info: insulin  asaprt novolog 0-20 units every 4 hours and insulin glargine (lantus) 30 units daily       Current Medications (12/29/2018):  This is the current hospital active medication list Current Facility-Administered Medications  Medication Dose Route Frequency Provider Last Rate Last Dose  . 0.9 %  sodium chloride infusion   Intravenous Continuous Lorretta HarpNiu, Xilin, MD 75 mL/hr at 12/29/18 0457    . albuterol (PROVENTIL) (2.5 MG/3ML) 0.083% nebulizer solution 2.5 mg  2.5 mg Nebulization Q2H PRN Zigmund DanielPowell, A Caldwell Jr., MD   2.5 mg at 12/28/18 1815  . apixaban (ELIQUIS) tablet 5 mg  5 mg Oral BID Drema DallasWoods, Curtis J, MD   5 mg at 12/29/18 [phone]  . aspirin EC tablet  81 mg  81 mg Oral Daily Mosetta Anis, RPH   81 mg at 12/29/18 8757  . atorvastatin (LIPITOR) tablet 20 mg  20 mg Oral q1800 Drema Dallas, MD   20 mg at 12/28/18 1815  . azithromycin (ZITHROMAX) 500 mg in sodium chloride 0.9 % 250 mL IVPB  500 mg Intravenous QHS Lorretta Harp, MD   Stopped at 12/28/18 2229  .  cefTRIAXone (ROCEPHIN) 2 g in sodium chloride 0.9 % 100 mL IVPB  2 g Intravenous Q24H Zigmund Daniel., MD 200 mL/hr at 12/28/18 1617 2 g at 12/28/18 1617  . dextromethorphan-guaiFENesin (MUCINEX DM) 30-600 MG per 12 hr tablet 1 tablet  1 tablet Oral BID Drema Dallas, MD   1 tablet at 12/29/18 [phone]  . famotidine (PEPCID) IVPB 20 mg premix  20 mg Intravenous Q24H Lorretta Harp, MD 100 mL/hr at 12/29/18 0905 20 mg at 12/29/18 0905  . feeding supplement (PRO-STAT SUGAR FREE 64) liquid 30 mL  30 mL Oral BID Drema Dallas, MD   30 mL at 12/29/18 0853  . hydrALAZINE (APRESOLINE) injection 5 mg  5 mg Intravenous Q2H PRN Lorretta Harp, MD      . insulin aspart (novoLOG) injection 0-20 Units  0-20 Units Subcutaneous Q4H Drema Dallas, MD   4 Units at 12/29/18 1223  . insulin glargine (LANTUS) injection 30 Units  30 Units Subcutaneous Q24H Drema Dallas, MD   30 Units at 12/29/18 [phone]  . ipratropium-albuterol (DUONEB) 0.5-2.5 (3) MG/3ML nebulizer solution 3 mL  3 mL Nebulization Q6H Drema Dallas, MD   3 mL at 12/29/18 0804  . methylPREDNISolone sodium succinate (SOLU-MEDROL) 125 mg/2 mL injection 60 mg  60 mg Intravenous BID Drema Dallas, MD   60 mg at 12/29/18 0854  . ondansetron (ZOFRAN) injection 4 mg  4 mg Intravenous Q8H PRN Lorretta Harp, MD      . protein supplement (ENSURE MAX) liquid  11 oz Oral Daily Drema Dallas, MD   11 oz at 12/29/18 0857  . sertraline (ZOLOFT) tablet 50 mg  50 mg Oral Daily Drema Dallas, MD   50 mg at 12/29/18 1561     Discharge Medications: Please see discharge summary for a list of discharge medications.  Relevant Imaging Results:  Relevant Lab Results:   Additional Information SSN: [id number]  Nada Boozer Tabria Steines, LCSWA

## 2018-12-29 NOTE — Progress Notes (Signed)
PROGRESS NOTE    Gregory Stone  HCW:[id number] DOB: Jan 23, 1946 DOA: 12/23/2018 PCP: Gregory Stone   Brief Narrative:  Gregory Stone a 73 y.o.BM PMHx hyperlipidemia, diabetes mellitus type II uncontrolled with complication, COPD on 3 L O2 at home, atrial fibrillation on Eliquis, depression,    Presents with altered mental status and shortness of breath.  Pt has AMS and isunable to provide any medical history, therefore, most of the history is obtained by discussing the case with ED physician, [facility] report, and with the nursing staff.  [facility] report, ptdeveloped shortness of breath in the facility, found to have oxygen desaturation to 50%. Pt wasgiven breathing treatment and also treated with a dose of Ativan, then patient became minimally responsive.When I saw pt in ED, he is barelyarousable, seems to move extremities upon painful stimuli. Patient has respiratory distress, but not actively coughing. No active nausea,vomiting or diarrhea noted. Not sure if patient has any pain anywhere.  ED Course:pt was found to have negative COVID19 test, negative Flu PCR,WBC 12.3, lactic acid of 4.9, troponin 0 0.03, urinalysis (cloudy appearance, rare bacteria, WBC 11-20), potassium 5.2, creatinine 0.89, BUN 18, abnormal liver function (ALP 61, AST 137, ALT 110, total bilirubin 0.8), ammonia level 27, temperature normal, atrial fibrillation with RVR, tachypnea, oxygen saturation 100% on 5 L nasal cannula oxygen,ABG withpH 7.182,pCO2103, PO2 142. Chest x-ray showed left upper lobe infiltration. CT head negative for acute intracranial abnormalities. Patient is admitted to stepdown as inpatient.   Assessment & Plan:   Principal Problem:   HCAP (healthcare-associated pneumonia) Active Problems:   HLD (hyperlipidemia)   Diabetes mellitus without complication (HCC)   Depression   COPD (chronic obstructive pulmonary disease) (HCC)   Atrial fibrillation with RVR (HCC)   Acute  on chronic respiratory failure with hypoxia (HCC)   Acute metabolic encephalopathy   Sepsis (HCC)   Hyperkalemia   Abnormal LFTs   Elevated troponin   COPD exacerbation (HCC)   Sepsis due to pneumonia (HCC)   Diabetes mellitus type 2, uncontrolled, with complications ([place])   Anxiety   Sepsis pneumonia/HCAP As previously noted: - CXR showed LUL infiltration. - On admission met criteria for sepsis, leukocytosis and tachycardia.  Lactic acid elevated 4.9 - COVID 19 negative - Positive Coronavirus H KU 1 -Urine strep pneumo/Legionella negative -Complete 7 days antibiotics day #4 -  Continue with Solu-Medrol 60 mg to BID - DuoNeb QID - Albuterol neb as needed -Flutter valve -Mucinex DM twice daily - 4/30 PT/OT: Evaluate for CIR  Vs SNF likely return to patient's prior skilled nursing facility [facility] -Out of bed every shift  COPD exacerbation - See sepsis pneumonia  Respiratory failure with hypoxia and hypercapnia -Resolved -See sepsis pneumonia - Titrate O2 to maintain SPO2 89 to 93% -Patient is on 3 L home O2 at baseline  Acute metabolic encephalopathy -Multifactorial sepsis, hypoxia. - Resolved  A. fib with RVR -CHA2DS2-VASc=2 - Most likely secondary to sepsis -Currently NSR - 4/29 restarted home Eliquis.  Discontinued Lovenox  Elevated troponin - Most likely demand ischemia troponins rose to 0.36.  Patient asymptomatic on exam. -EKG at presentation sinus tachycardia no previous EKG for comparison [facility]         Recent Labs  Lab 12/26/18 0645 12/26/18 1124 12/26/18 1438 12/26/18 2030 12/27/18 0300  TROPONINI 0.31* 0.36* 0.31* 0.28* 0.20*    -Troponin resolving -No anginal symptomatology  Diabetes type 2 uncontrolled with complication - 1/76 Hemoglobin A1c= 7.6 -4/30 Lantus 30 units daily - 4/30  resistant SSI   HLD - LDL at the ADA guidelines -4/30 started Lipitor  Lactic acidosis - Resolved  Depression/anxiety - Zoloft 50 mg  daily -Hold home Ativan for now  Hyperkalemia -4.9 -Resolved monitor closely  Hypomagnesmia - Magnesium goal> 2 - Magnesium 2.1 g  Abnormal LFTs -Most likely secondary sepsis will follow   DVT prophylaxis: OI:NOMVEHM  Code Status: DNR    Code Status Orders  (From admission, onward)         Start     Ordered   12/26/18 0016  Do not attempt resuscitation (DNR)  Continuous    Question Answer Comment  In the event of cardiac or respiratory ARREST Do not call a "code blue"   In the event of cardiac or respiratory ARREST Do not perform Intubation, CPR, defibrillation or ACLS   In the event of cardiac or respiratory ARREST Use medication by any route, position, wound care, and other measures to relive pain and suffering. May use oxygen, suction and manual treatment of airway obstruction as needed for comfort.   Comments confirmed with daughter, active DNR in place. OK with bipap      12/26/18 0015        Code Status History    Date Active Date Inactive Code Status Order ID Comments User Context   12/15/2018 2106 12/26/2018 0015 DNR [id number]  Deno Etienne, DO ED   11/19/2018 1531 11/30/2018 0110 Full Code [id number]  [name] Inpatient   11/19/2018 0728 11/19/2018 1531 DNR [id number]  [name] Inpatient     Family Communication: None today  Disposition Plan:   Inpatient for continued treatment of pulmonary infection to include IV antibiotics, respiratory support, steroids, with frequent nurse monitoring.  Given patient presentation with sepsis will need to continue to monitor electrolytes. Consults called: None Admission status: Inpatient   Consultants:   None  Procedures:  Ct Head Wo Contrast  Result Date: 12/26/2018 CLINICAL DATA:  73 year old male with ataxia, encephalopathy, atrial fibrillation. EXAM: CT HEAD WITHOUT CONTRAST TECHNIQUE: Contiguous axial images were obtained from the base of the skull through the vertex without intravenous contrast.  COMPARISON:  None. FINDINGS: Brain: Cerebral volume is within normal limits for age. No midline shift, ventriculomegaly, mass effect, evidence of mass lesion, intracranial hemorrhage or evidence of cortically based acute infarction. Patchy periatrial white matter hypodensity. No cortical encephalomalacia. Vascular: Calcified atherosclerosis at the skull base. No suspicious intracranial vascular hyperdensity. Skull: Negative. Sinuses/Orbits: Visualized paranasal sinuses and mastoids are well pneumatized. Other: Negative orbits. Visualized scalp soft tissues are within normal limits. IMPRESSION: 1. No acute intracranial abnormality. 2. Mild for age nonspecific cerebral white matter changes, most commonly due to small vessel disease. Electronically Signed   By: Genevie Ann M.D.   On: 12/26/2018 01:49   Dg Chest Port 1 View  Result Date: 12/22/2018 CLINICAL DATA:  73 year old male with sepsis. EXAM: PORTABLE CHEST 1 VIEW COMPARISON:  11/26/2018 and earlier. FINDINGS: Portable AP supine view at 2057 hours. Stable large lung volumes. Stable cardiac size and mediastinal contours. Visualized tracheal air column is within normal limits. No pneumothorax, pleural effusion or consolidation. New asymmetric reticulonodular opacity in the left upper lung. No acute osseous abnormality identified. IMPRESSION: New left upper lobe reticulonodular pulmonary opacity suspicious for acute viral/atypical respiratory infection. Underlying pulmonary hyperinflation. Electronically Signed   By: Genevie Ann M.D.   On: 12/08/2018 22:02     Antimicrobials:   Ceftriaxone and azithromycin day #4   Subjective: No acute events overnight.  Patient reports he still feels very weak but slowly improving.  Minimal ambulation with PT secondary to weakness Stone patient.  Objective: Vitals:   12/29/18 0314 12/29/18 0349 12/29/18 0809 12/29/18 1130  BP: 130/81   131/76  Pulse: 86 83  93  Resp: (!) 32 (!) 31  20  Temp: 97.7 F (36.5 C)   98.2 F  (36.8 C)  TempSrc: Axillary   Oral  SpO2: 100% 100% 100% 95%  Weight: 67.5 kg       Intake/Output Summary (Last 24 hours) at 12/29/2018 1435 Last data filed at 12/29/2018 1131 [address] Stone 24 hour  Intake 2495.82 ml  Output -  Net 2495.82 ml   Filed Weights   12/26/18 0100 12/28/18 0456 12/29/18 0314  Weight: 61.7 kg 64.7 kg 67.5 kg    Examination:  General exam: Appears calm and comfortable, appears to have some impaired cognition Respiratory system: Mild rhonchi, no accessory muscle use trace wheezing noted Cardiovascular system: S1 & S2 heard, RRR. No JVD, murmurs, rubs, gallops or clicks. No pedal edema. Gastrointestinal system: Abdomen is nondistended, soft and nontender. No organomegaly or masses felt. Normal bowel sounds heard. Central nervous system: Alert and oriented. No focal neurological deficits. Extremities: Warm well perfused no contractures Skin: No rashes, lesions or ulcers Psychiatry: No acute decompensation, does appear to have some baseline cognitive deficits, mood & affect appropriate.     Data Reviewed: I have personally reviewed following labs and imaging studies  CBC: Recent Labs  Lab 12/09/2018 2102 12/26/18 0831 12/27/18 0300 12/28/18 0345 12/29/18 1008  WBC 12.3* 8.6 9.4 6.8 13.1*  NEUTROABS 10.8* 7.6  --   --   --   HGB 9.6* 8.1* 7.8* 8.0* 9.2*  HCT 34.1* 28.1* 26.5* 27.9* 30.1*  MCV 101.5* 99.6 97.4 98.6 99.0  PLT 250 194 190 185 710   Basic Metabolic Panel: Recent Labs  Lab 12/04/2018 2102 12/26/18 0042 12/26/18 0831 12/26/18 2030 12/27/18 0300 12/28/18 0345 12/29/18 0312  NA 140  --  141 140  --  139 140  K 5.2* 5.2* 5.1 4.9  --  4.3 4.9  CL 87*  --  93* 92*  --  95* 94*  CO2 39*  --  43* 39*  --  38* 38*  GLUCOSE 346*  --  282* 167*  --  147* 294*  BUN 18  --  16 17  --  15 26*  CREATININE 0.89  --  0.74 0.75  --  0.54* 0.73  CALCIUM 9.8  --  9.2 9.4  --  9.2 9.4  MG  --   --   --   --  1.9 1.7 2.1   GFR: CrCl cannot be  calculated (Unknown ideal weight.). Liver Function Tests: Recent Labs  Lab 12/27/2018 2102 12/26/18 0831 12/26/18 2030  AST 137* 51* 37  ALT 110* 83* 72*  ALKPHOS 61 49 54  BILITOT 0.8 0.5 0.4  PROT 6.6 5.6* 5.9*  ALBUMIN 2.5* 2.1* 2.3*   No results for input(s): LIPASE, AMYLASE in the last 168 hours. Recent Labs  Lab 12/26/18 0042  AMMONIA 27   Coagulation Profile: No results for input(s): INR, PROTIME in the last 168 hours. Cardiac Enzymes: Recent Labs  Lab 12/26/18 0645 12/26/18 1124 12/26/18 1438 12/26/18 2030 12/27/18 0300  TROPONINI 0.31* 0.36* 0.31* 0.28* 0.20*   BNP (last 3 results) No results for input(s): PROBNP in the last 8760 hours. HbA1C: No results for input(s): HGBA1C in the last 72 hours. CBG:  Recent Labs  Lab 12/28/18 2022 12/29/18 0015 12/29/18 0425 12/29/18 0736 12/29/18 1127  GLUCAP 282* 301* 229* 143* 187*   Lipid Profile: Recent Labs    12/28/18 0345  CHOL 137  HDL 57  LDLCALC 66  TRIG 68  CHOLHDL 2.4   Thyroid Function Tests: No results for input(s): TSH, T4TOTAL, FREET4, T3FREE, THYROIDAB in the last 72 hours. Anemia Panel: No results for input(s): VITAMINB12, FOLATE, FERRITIN, TIBC, IRON, RETICCTPCT in the last 72 hours. Sepsis Labs: Recent Labs  Lab 12/26/18 0042 12/26/18 0320 12/26/18 1438 12/26/18 2030  PROCALCITON 0.17  --   --   --   LATICACIDVEN 1.2 3.4* 2.4* 0.9    Recent Results (from the past 240 hour(s))  SARS Coronavirus 2 [facility] order, Performed in [place] hospital lab)     Status: None   Collection Time: 12/04/2018  8:45 PM  Result Value Ref Range Status   SARS Coronavirus 2 NEGATIVE NEGATIVE Final    Comment: (NOTE) If result is NEGATIVE SARS-CoV-2 target nucleic acids are NOT DETECTED. The SARS-CoV-2 RNA is generally detectable in upper and lower  respiratory specimens during the acute phase of infection. The lowest  concentration of SARS-CoV-2 viral copies this assay can detect is 250   copies / mL. A negative result does not preclude SARS-CoV-2 infection  and should not be used as the sole basis for treatment or other  patient management decisions.  A negative result may occur with  improper specimen collection / handling, submission of specimen other  than nasopharyngeal swab, presence of viral mutation(s) within the  areas targeted by this assay, and inadequate number of viral copies  (<250 copies / mL). A negative result must be combined with clinical  observations, patient history, and epidemiological information. If result is POSITIVE SARS-CoV-2 target nucleic acids are DETECTED. The SARS-CoV-2 RNA is generally detectable in upper and lower  respiratory specimens dur ing the acute phase of infection.  Positive  results are indicative of active infection with SARS-CoV-2.  Clinical  correlation with patient history and other diagnostic information is  necessary to determine patient infection status.  Positive results do  not rule out bacterial infection or co-infection with other viruses. If result is PRESUMPTIVE POSTIVE SARS-CoV-2 nucleic acids MAY BE PRESENT.   A presumptive positive result was obtained on the submitted specimen  and confirmed on repeat testing.  While 2019 novel coronavirus  (SARS-CoV-2) nucleic acids may be present in the submitted sample  additional confirmatory testing may be necessary for epidemiological  and / or clinical management purposes  to differentiate between  SARS-CoV-2 and other Sarbecovirus currently known to infect humans.  If clinically indicated additional testing with an alternate test  methodology [phone]) is advised. The SARS-CoV-2 RNA is generally  detectable in upper and lower respiratory sp ecimens during the acute  phase of infection. The expected result is Negative. Fact Sheet for Patients:  StrictlyIdeas.no Fact Sheet for Healthcare Providers: BankingDealers.co.za  This test is not yet approved or cleared by the [place] FDA and has been authorized for detection and/or diagnosis of SARS-CoV-2 by FDA under an Emergency Use Authorization (EUA).  This EUA will remain in effect (meaning this test can be used) for the duration of the COVID-19 declaration under Section 564(b)(1) of the Act, 21 U.S.C. section 360bbb-3(b)(1), unless the authorization is terminated or revoked sooner. Performed at [facility], [place] [address] [place], [place] [address]   Blood Culture (routine x 2)  Status: Abnormal   Collection Time: 12/01/2018  8:55 PM  Result Value Ref Range Status   Specimen Description BLOOD LEFT ARM  Final   Special Requests   Final    BOTTLES DRAWN AEROBIC AND ANAEROBIC Blood Culture results may not be optimal due to an inadequate volume of blood received in culture bottles   Culture  Setup Time   Final    GRAM POSITIVE COCCI AEROBIC BOTTLE ONLY CRITICAL RESULT CALLED TO, READ BACK BY AND VERIFIED WITH: J ORIET PHARMD 12/28/18 0013 JDW    Culture (A)  Final    STAPHYLOCOCCUS SPECIES (COAGULASE NEGATIVE) THE SIGNIFICANCE OF ISOLATING THIS ORGANISM FROM A SINGLE SET OF BLOOD CULTURES WHEN MULTIPLE SETS ARE DRAWN IS UNCERTAIN. PLEASE NOTIFY THE MICROBIOLOGY DEPARTMENT WITHIN ONE WEEK IF SPECIATION AND SENSITIVITIES ARE REQUIRED. Performed at [facility], [place] [address], [place], [place] [address]    Report Status 12/29/2018 FINAL  Final  Blood Culture ID Panel (Reflexed)     Status: Abnormal   Collection Time: 12/15/2018  8:55 PM  Result Value Ref Range Status   Enterococcus species NOT DETECTED NOT DETECTED Final   Listeria monocytogenes NOT DETECTED NOT DETECTED Final   Staphylococcus species DETECTED (A) NOT DETECTED Final    Comment: Methicillin (oxacillin) resistant coagulase negative staphylococcus. Possible blood culture contaminant (unless isolated from more than one blood culture draw or clinical case suggests  pathogenicity). No antibiotic treatment is indicated for blood  culture contaminants.    Staphylococcus aureus (BCID) NOT DETECTED NOT DETECTED Final   Methicillin resistance DETECTED (A) NOT DETECTED Final    Comment: CRITICAL RESULT CALLED TO, READ BACK BY AND VERIFIED WITH: J ORIET PHARMD 12/28/18 0013 JDW    Streptococcus species NOT DETECTED NOT DETECTED Final   Streptococcus agalactiae NOT DETECTED NOT DETECTED Final   Streptococcus pneumoniae NOT DETECTED NOT DETECTED Final   Streptococcus pyogenes NOT DETECTED NOT DETECTED Final   Acinetobacter baumannii NOT DETECTED NOT DETECTED Final   Enterobacteriaceae species NOT DETECTED NOT DETECTED Final   Enterobacter cloacae complex NOT DETECTED NOT DETECTED Final   Escherichia coli NOT DETECTED NOT DETECTED Final   Klebsiella oxytoca NOT DETECTED NOT DETECTED Final   Klebsiella pneumoniae NOT DETECTED NOT DETECTED Final   Proteus species NOT DETECTED NOT DETECTED Final   Serratia marcescens NOT DETECTED NOT DETECTED Final   Haemophilus influenzae NOT DETECTED NOT DETECTED Final   Neisseria meningitidis NOT DETECTED NOT DETECTED Final   Pseudomonas aeruginosa NOT DETECTED NOT DETECTED Final   Candida albicans NOT DETECTED NOT DETECTED Final   Candida glabrata NOT DETECTED NOT DETECTED Final   Candida krusei NOT DETECTED NOT DETECTED Final   Candida parapsilosis NOT DETECTED NOT DETECTED Final   Candida tropicalis NOT DETECTED NOT DETECTED Final    Comment: Performed at [facility], [place]. [address] [place], [place] [address]  Blood Culture (routine x 2)     Status: None (Preliminary result)   Collection Time: 12/14/2018  9:00 PM  Result Value Ref Range Status   Specimen Description BLOOD RIGHT ARM  Final   Special Requests   Final    BOTTLES DRAWN AEROBIC AND ANAEROBIC Blood Culture results may not be optimal due to an excessive volume of blood received in culture bottles   Culture   Final    NO GROWTH 3 DAYS Performed at  [facility], [place] [address], [place], [place] [address]    Report Status PENDING  Incomplete  Respiratory  Panel by PCR     Status: Abnormal   Collection Time: 12/26/18  1:12 AM  Result Value Ref Range Status   Adenovirus NOT DETECTED NOT DETECTED Final   Coronavirus 229E NOT DETECTED NOT DETECTED Final    Comment: (NOTE) The Coronavirus on the Respiratory Panel, DOES NOT test for the novel  Coronavirus (2019 nCoV)    Coronavirus HKU1 DETECTED (A) NOT DETECTED Final   Coronavirus NL63 NOT DETECTED NOT DETECTED Final   Coronavirus OC43 NOT DETECTED NOT DETECTED Final   Metapneumovirus NOT DETECTED NOT DETECTED Final   Rhinovirus / Enterovirus NOT DETECTED NOT DETECTED Final   Influenza A NOT DETECTED NOT DETECTED Final   Influenza B NOT DETECTED NOT DETECTED Final   Parainfluenza Virus 1 NOT DETECTED NOT DETECTED Final   Parainfluenza Virus 2 NOT DETECTED NOT DETECTED Final   Parainfluenza Virus 3 NOT DETECTED NOT DETECTED Final   Parainfluenza Virus 4 NOT DETECTED NOT DETECTED Final   Respiratory Syncytial Virus NOT DETECTED NOT DETECTED Final   Bordetella pertussis NOT DETECTED NOT DETECTED Final   Chlamydophila pneumoniae NOT DETECTED NOT DETECTED Final   Mycoplasma pneumoniae NOT DETECTED NOT DETECTED Final    Comment: Performed at [facility], [address], [place], [place] [address]  Urine culture     Status: Abnormal   Collection Time: 12/26/18  2:59 AM  Result Value Ref Range Status   Specimen Description URINE, CLEAN CATCH  Final   Special Requests NONE  Final   Culture (A)  Final    <10,000 COLONIES/mL INSIGNIFICANT GROWTH Performed at [facility], [place] [address] [place], [place] [address]    Report Status 12/26/2018 FINAL  Final  MRSA PCR Screening     Status: None   Collection Time: 12/26/18  4:45 PM  Result Value Ref Range Status   MRSA by PCR NEGATIVE NEGATIVE Final    Comment:        The GeneXpert MRSA Assay (FDA approved for NASAL  specimens only), is one component of a comprehensive MRSA colonization surveillance program. It is not intended to diagnose MRSA infection nor to guide or monitor treatment for MRSA infections. Performed at [facility], [place] [address], [place], [place] [address]          Radiology Studies: No results found.      Scheduled Meds: . apixaban  5 mg Oral BID  . aspirin EC  81 mg Oral Daily  . atorvastatin  20 mg Oral q1800  . dextromethorphan-guaiFENesin  1 tablet Oral BID  . feeding supplement (PRO-STAT SUGAR FREE 64)  30 mL Oral BID  . insulin aspart  0-20 Units Subcutaneous Q4H  . insulin glargine  30 Units Subcutaneous Q24H  . ipratropium-albuterol  3 mL Nebulization Q6H  . methylPREDNISolone (SOLU-MEDROL) injection  60 mg Intravenous BID  . Ensure Max Protein  11 oz Oral Daily  . sertraline  50 mg Oral Daily   Continuous Infusions: . sodium chloride 75 mL/hr at 12/29/18 0457  . azithromycin Stopped (12/28/18 2229)  . cefTRIAXone (ROCEPHIN)  IV 2 g (12/28/18 1617)  . famotidine (PEPCID) IV 20 mg (12/29/18 0905)     LOS: 3 days    Time spent: 81 min    Nicolette Bang, MD [facility]  If 7PM-7AM, please contact night-coverage  12/29/2018, 2:35 PM

## 2018-12-29 NOTE — Evaluation (Signed)
Physical Therapy Evaluation Patient Details Name: Gregory OresJoshua Arpin MRN: [id number] DOB: 1946/05/17 Today's Date: 12/29/2018   History of Present Illness  73 yo admitted from [facility] with AMS and SOB with HCAP covid (-). PMhx: COPD on home O2, HLD, DM, Afib, depression  Clinical Impression  Pt with flat affect oriented to person and place but not aware of time. Pt on 4L throughout activity with HR 86, SpO2 96-100%. PT with decreased cognition, balance, transfers, gait and function who [facility] but uanble to state how long he has been there or how often he walks. Pt will benefit from acute therapy to maximize mobility, transfers, gait and safety to increase function and decrease burden of care. Recommend daily mobility and OOB with nursing.     Follow Up Recommendations SNF;Supervision/Assistance - 24 hour    Equipment Recommendations  None recommended by PT    Recommendations for Other Services OT consult     Precautions / Restrictions Precautions Precaution Comments: home O2 3L Restrictions Weight Bearing Restrictions: No      Mobility  Bed Mobility Overal bed mobility: Needs Assistance Bed Mobility: Supine to Sit     Supine to sit: Min assist;HOB elevated     General bed mobility comments: min assist to bring legs to EOB with HOB 60 degrees, use of rail, multimodal cues and increased time  Transfers Overall transfer level: Needs assistance   Transfers: Sit to/from Stand;Stand Pivot Transfers Sit to Stand: Min guard Stand pivot transfers: Min assist       General transfer comment: cues for hand placment with increased time to rise and min assist to control and direct RW for pivot to chair.   Ambulation/Gait             General Gait Details: pt declined attempting to walk further  Stairs            Wheelchair Mobility    Modified Rankin (Stroke Patients Only)       Balance Overall balance assessment: Needs  assistance Sitting-balance support: Feet supported Sitting balance-Leahy Scale: Fair     Standing balance support: Bilateral upper extremity supported Standing balance-Leahy Scale: Poor                               Pertinent Vitals/Pain Pain Assessment: No/denies pain    Home Living Family/patient expects to be discharged to:: Skilled nursing facility                      Prior Function Level of Independence: Needs assistance   Gait / Transfers Assistance Needed: pt reports he walks some with a rW and assist unable to qualify distance  ADL's / Homemaking Assistance Needed: assist for bathing and dressing takes WC to dining hall        Hand Dominance        Extremity/Trunk Assessment   Upper Extremity Assessment Upper Extremity Assessment: Generalized weakness    Lower Extremity Assessment Lower Extremity Assessment: Generalized weakness    Cervical / Trunk Assessment Cervical / Trunk Assessment: Normal  Communication   Communication: No difficulties  Cognition Arousal/Alertness: Awake/alert Behavior During Therapy: Flat affect Overall Cognitive Status: Impaired/Different from baseline Area of Impairment: Orientation;Attention;Memory;Problem solving;Following commands;Safety/judgement                 Orientation Level: Disoriented to;Time Current Attention Level: Sustained Memory: Decreased short-term memory Following Commands: Follows one step commands  with increased time;Follows one step commands inconsistently Safety/Judgement: Decreased awareness of safety;Decreased awareness of deficits   Problem Solving: Slow processing;Decreased initiation;Difficulty sequencing;Requires verbal cues;Requires tactile cues General Comments: Pt oriented to place and able to state that he was SOB but not able to consistently provide PLOF, slow to respond to commands      General Comments      Exercises General Exercises - Lower  Extremity Long Arc Quad: AROM;10 reps;Seated;Both Hip Flexion/Marching: AROM;10 reps;Seated;Both   Assessment/Plan    PT Assessment Patient needs continued PT services  PT Problem List Decreased range of motion;Decreased mobility;Decreased activity tolerance;Decreased strength;Decreased balance;Decreased cognition;Decreased knowledge of use of DME;Cardiopulmonary status limiting activity       PT Treatment Interventions DME instruction;Functional mobility training;Balance training;Patient/family education;Gait training;Therapeutic activities;Therapeutic exercise;Cognitive remediation    PT Goals (Current goals can be found in the Care Plan section)  Acute Rehab PT Goals Patient Stated Goal: feel better PT Goal Formulation: With patient Time For Goal Achievement: 01/12/19 Potential to Achieve Goals: Fair    Frequency Min 2X/week   Barriers to discharge        Co-evaluation               AM-PAC PT "6 Clicks" Mobility  Outcome Measure Help needed turning from your back to your side while in a flat bed without using bedrails?: A Little Help needed moving from lying on your back to sitting on the side of a flat bed without using bedrails?: A Little Help needed moving to and from a bed to a chair (including a wheelchair)?: A Little Help needed standing up from a chair using your arms (e.g., wheelchair or bedside chair)?: A Little Help needed to walk in hospital room?: A Lot Help needed climbing 3-5 steps with a railing? : Total 6 Click Score: 15    End of Session Equipment Utilized During Treatment: Gait belt;Oxygen Activity Tolerance: Patient limited by fatigue Patient left: in chair;with call bell/phone within reach;with chair alarm set Nurse Communication: Mobility status PT Visit Diagnosis: Unsteadiness on feet (R26.81);Muscle weakness (generalized) (M62.81);Difficulty in walking, not elsewhere classified (R26.2);Other abnormalities of gait and mobility (R26.89)     Time: 9417-4081 PT Time Calculation (min) (ACUTE ONLY): 28 min   Charges:   PT Evaluation $PT Eval Moderate Complexity: 1 Mod PT Treatments $Therapeutic Activity: 8-22 mins        Javanni Maring Abner Greenspan, PT Acute Rehabilitation Services Pager: [phone] Office: [phone]   Oluwatimileyin Vivier B Rosalyn Archambault 12/29/2018, 12:25 PM

## 2018-12-29 DEATH — deceased

## 2018-12-30 LAB — CBC
HCT: 31 % — ABNORMAL LOW (ref 39.0–52.0)
Hemoglobin: 8.8 g/dL — ABNORMAL LOW (ref 13.0–17.0)
MCH: 28.5 pg (ref 26.0–34.0)
MCHC: 28.4 g/dL — ABNORMAL LOW (ref 30.0–36.0)
MCV: 100.3 fL — ABNORMAL HIGH (ref 80.0–100.0)
Platelets: 180 10*3/uL (ref 150–400)
RBC: 3.09 MIL/uL — ABNORMAL LOW (ref 4.22–5.81)
RDW: 15.2 % (ref 11.5–15.5)
WBC: 9.4 10*3/uL (ref 4.0–10.5)
nRBC: 0 % (ref 0.0–0.2)

## 2018-12-30 LAB — BASIC METABOLIC PANEL
Anion gap: 5 (ref 5–15)
BUN: 19 mg/dL (ref 8–23)
CO2: 42 mmol/L — ABNORMAL HIGH (ref 22–32)
Calcium: 9.3 mg/dL (ref 8.9–10.3)
Chloride: 95 mmol/L — ABNORMAL LOW (ref 98–111)
Creatinine, Ser: 0.54 mg/dL — ABNORMAL LOW (ref 0.61–1.24)
GFR calc Af Amer: >60 mL/min (ref >60)
GFR calc non Af Amer: >60 mL/min (ref >60)
Glucose, Bld: 117 mg/dL — ABNORMAL HIGH (ref 70–99)
Potassium: 4.8 mmol/L (ref 3.5–5.1)
Sodium: 142 mmol/L (ref 135–145)

## 2018-12-30 LAB — CULTURE, BLOOD (ROUTINE X 2): Culture: NO GROWTH

## 2018-12-30 LAB — MAGNESIUM: Magnesium: 1.9 mg/dL (ref 1.7–2.4)

## 2018-12-30 LAB — GLUCOSE, CAPILLARY
Glucose-Capillary: 138 mg/dL — ABNORMAL HIGH (ref 70–99)
Glucose-Capillary: 169 mg/dL — ABNORMAL HIGH (ref 70–99)
Glucose-Capillary: 178 mg/dL — ABNORMAL HIGH (ref 70–99)
Glucose-Capillary: 191 mg/dL — ABNORMAL HIGH (ref 70–99)
Glucose-Capillary: 81 mg/dL (ref 70–99)
Glucose-Capillary: 84 mg/dL (ref 70–99)

## 2018-12-30 MED ORDER — IPRATROPIUM-ALBUTEROL 0.5-2.5 (3) MG/3ML IN SOLN
3.0000 mL | Freq: Three times a day (TID) | RESPIRATORY_TRACT | Status: DC
  Administered 2018-12-30 – 2018-12-31 (×6): 3 mL via RESPIRATORY_TRACT
  Filled 2018-12-30 (×5): qty 3
  Filled 2018-12-30: qty 39

## 2018-12-30 MED ORDER — METHYLPREDNISOLONE SODIUM SUCC 40 MG IJ SOLR
40.0000 mg | Freq: Two times a day (BID) | INTRAMUSCULAR | Status: DC
  Administered 2018-12-30 – 2019-01-03 (×7): 40 mg via INTRAVENOUS
  Filled 2018-12-30 (×8): qty 1

## 2018-12-30 MED ORDER — FAMOTIDINE 20 MG PO TABS
20.0000 mg | ORAL_TABLET | Freq: Every day | ORAL | Status: DC
  Administered 2018-12-31 – 2019-01-12 (×13): 20 mg via ORAL
  Filled 2018-12-30 (×13): qty 1

## 2018-12-30 NOTE — Progress Notes (Signed)
PROGRESS NOTE    Gregory Stone  OEU:[id number] DOB: 1946/06/12 DOA: 12/26/2018 PCP: Patient, No Pcp Per   Brief Narrative:  Gregory Stone a 73 y.o.BM PMHx hyperlipidemia, diabetes mellitus type II uncontrolled with complication, COPDon3 L O2 at home, atrial fibrillation on Eliquis,depression,    Presents with altered mental status and shortness of breath.  Pt has AMS and isunable to provide any medical history, therefore, most of the history is obtained by discussing the case with ED physician, per EMS report, and with the nursing staff.  Per report, ptdeveloped shortness of breathin the facility,found to have oxygen desaturation to 50%. Pt wasgiven breathing treatment and also treated with a dose of Ativan, then patient became minimally responsive.When I saw pt in ED, he is barelyarousable, seems to move extremities upon painful stimuli. Patient has respiratory distress, but not actively coughing. No active nausea,vomiting or diarrhea noted. Not sure if patient has any pain anywhere.  ED Course:pt was found to have negative COVID19 test, negative Flu PCR,WBC 12.3, lactic acid of 4.9, troponin 0 0.03, urinalysis (cloudy appearance, rare bacteria, WBC 11-20), potassium 5.2, creatinine 0.89, BUN 18, abnormal liver function (ALP 61, AST 137, ALT 110, total bilirubin 0.8), ammonia level 27, temperature normal, atrial fibrillation with RVR, tachypnea, oxygen saturation 100% on 5 L nasal cannula oxygen,ABG withpH 7.182,pCO2103, PO2 142. Chest x-ray showed left upper lobe infiltration. CT head negative for acute intracranial abnormalities. Patient is admitted to stepdown as inpatient.   Assessment & Plan:   Principal Problem:   HCAP (healthcare-associated pneumonia) Active Problems:   HLD (hyperlipidemia)   Diabetes mellitus without complication (HCC)   Depression   COPD (chronic obstructive pulmonary disease) (HCC)   Atrial fibrillation with RVR (HCC)   Acute  on chronic respiratory failure with hypoxia (HCC)   Acute metabolic encephalopathy   Sepsis (HCC)   Hyperkalemia   Abnormal LFTs   Elevated troponin   COPD exacerbation (HCC)   Sepsis due to pneumonia (HCC)   Diabetes mellitus type 2, uncontrolled, with complications ([place])   Anxiety   Sepsis pneumonia/HCAP As previously noted: - CXR showed LUL infiltration. - On admission met criteria for sepsis, leukocytosis and tachycardia. Lactic acid elevated 4.9 -COVID 19negative - PositiveCoronavirus H KU 1 -Urine strep pneumo/Legionella negative -Complete 7 days antibiotics day #5 - Continue with Solu-Medrol 60 mg to BID but decrease to 41m - DuoNeb QID - Albuterol neb as needed -Flutter valve -Mucinex DM twice daily - 4/30 PT/OT: Likely return to patient's prior skilled nursing facility [facility] -Out of bed every shift  COPD exacerbation - See sepsis pneumonia  Respiratory failure with hypoxia and hypercapnia -Resolved -See sepsis pneumonia - Titrate O2 to maintain SPO2 89 to 93% -Patient is on 3 L home O2 at baseline  Acute metabolic encephalopathy -Multifactorial sepsis, hypoxia. -Resolved  A. fib with RVR -CHA2DS2-VASc=2 - Most likely secondary to sepsis -Currently NSR - 4/29 restarted home Eliquis. Discontinued Lovenox  Elevated troponin - Most likely demand ischemia troponins rose to 0.36. Patient asymptomatic on exam. -EKG at presentation sinus tachycardia no previous EKG for comparison [facility]         Recent Labs  Lab 12/26/18 0645 12/26/18 1124 12/26/18 1438 12/26/18 2030 12/27/18 0300  TROPONINI 0.31* 0.36* 0.31* 0.28* 0.20*    -Troponin resolving -No anginal symptomatology  Diabetes type 2 uncontrolled with complication - 41/54Hemoglobin A1c= 7.6 -4/30 Lantus 30 units daily -4/30 resistant SSI  HLD - LDL at the ADA guidelines -4/30 started Lipitor  Lactic  acidosis - Resolved  Depression/anxiety -  Zoloft 50 mg daily -Cont to hold home Ativan for now  Hyperkalemia -4.8 -Resolved monitor closely  Hypomagnesmia - Magnesium goal>2 - Magnesium 1.9 g, recheck in AM  Abnormal LFTs -Most likely secondary sepsis will follow    DVT prophylaxis: TM:LYYTKPT  Code Status: DNR    Code Status Orders  (From admission, onward)         Start     Ordered   12/26/18 0016  Do not attempt resuscitation (DNR)  Continuous    Question Answer Comment  In the event of cardiac or respiratory ARREST Do not call a "code blue"   In the event of cardiac or respiratory ARREST Do not perform Intubation, CPR, defibrillation or ACLS   In the event of cardiac or respiratory ARREST Use medication by any route, position, wound care, and other measures to relive pain and suffering. May use oxygen, suction and manual treatment of airway obstruction as needed for comfort.   Comments confirmed with daughter, active DNR in place. OK with bipap      12/26/18 0015        Code Status History    Date Active Date Inactive Code Status Order ID Comments User Context   12/09/2018 2106 12/26/2018 0015 DNR [id number]  Deno Etienne, DO ED   11/19/2018 1531 11/30/2018 0110 Full Code [id number]  [name] Inpatient   11/19/2018 0728 11/19/2018 1531 DNR [id number]  [place]     Family Communication: By phone Disposition Plan:   Inpatient for continued treatment of pulmonary infection to include IV antibiotics, respiratory support, steroids, with frequent nurse monitoring.  Given patient presentation with sepsis will need to continue to monitor electrolytes. Consults called: None Admission status: Inpatient   Consultants:   None  Procedures:  Ct Head Wo Contrast  Result Date: 12/26/2018 CLINICAL DATA:  73 year old male with ataxia, encephalopathy, atrial fibrillation. EXAM: CT HEAD WITHOUT CONTRAST TECHNIQUE: Contiguous axial images were obtained from the base of the skull through the  vertex without intravenous contrast. COMPARISON:  None. FINDINGS: Brain: Cerebral volume is within normal limits for age. No midline shift, ventriculomegaly, mass effect, evidence of mass lesion, intracranial hemorrhage or evidence of cortically based acute infarction. Patchy periatrial white matter hypodensity. No cortical encephalomalacia. Vascular: Calcified atherosclerosis at the skull base. No suspicious intracranial vascular hyperdensity. Skull: Negative. Sinuses/Orbits: Visualized paranasal sinuses and mastoids are well pneumatized. Other: Negative orbits. Visualized scalp soft tissues are within normal limits. IMPRESSION: 1. No acute intracranial abnormality. 2. Mild for age nonspecific cerebral white matter changes, most commonly due to small vessel disease. Electronically Signed   By: Genevie Ann M.D.   On: 12/26/2018 01:49   Dg Chest Port 1 View  Result Date: 12/01/2018 CLINICAL DATA:  73 year old male with sepsis. EXAM: PORTABLE CHEST 1 VIEW COMPARISON:  11/26/2018 and earlier. FINDINGS: Portable AP supine view at 2057 hours. Stable large lung volumes. Stable cardiac size and mediastinal contours. Visualized tracheal air column is within normal limits. No pneumothorax, pleural effusion or consolidation. New asymmetric reticulonodular opacity in the left upper lung. No acute osseous abnormality identified. IMPRESSION: New left upper lobe reticulonodular pulmonary opacity suspicious for acute viral/atypical respiratory infection. Underlying pulmonary hyperinflation. Electronically Signed   By: Genevie Ann M.D.   On: 12/09/2018 22:02     Antimicrobials:   Cef/azithr day 5    Subjective: No acute events overnight, reports feeling weak but mildly improved from yesterday  Objective: Vitals:  12/30/18 0549 12/30/18 0732 12/30/18 0803 12/30/18 1123  BP: 133/73  137/68 131/63  Pulse: 98  82 91  Resp: (!) 28  (!) 24 (!) 26  Temp: 98 F (36.7 C)  98 F (36.7 C) 98.7 F (37.1 C)  TempSrc: Oral   Oral Oral  SpO2: 100% 99% 99% 94%  Weight: 69.6 kg       Intake/Output Summary (Last 24 hours) at 12/30/2018 1131 Last data filed at 12/30/2018 1125 Gross per 24 hour  Intake 480 ml  Output 2600 ml  Net -2120 ml   Filed Weights   12/28/18 0456 12/29/18 0314 12/30/18 0549  Weight: 64.7 kg 67.5 kg 69.6 kg    Examination:  General exam: Appears calm and comfortable  Respiratory system: Clear to auscultation. Respiratory effort normal. Cardiovascular system: S1 & S2 heard, RRR. No JVD, murmurs, rubs, gallops or clicks. No pedal edema. Gastrointestinal system: Abdomen is nondistended, soft and nontender. No organomegaly or masses felt. Normal bowel sounds heard. Central nervous system: Alert and oriented. No focal neurological deficits. Extremities: WWP, no contractures Skin: No rashes, lesions or ulcers Psychiatry: Judgement and insight appear normal given mild cog impairment vs dementia,. Mood & affect appropriate.     Data Reviewed: I have personally reviewed following labs and imaging studies  CBC: Recent Labs  Lab 12/21/2018 2102 12/26/18 0831 12/27/18 0300 12/28/18 0345 12/29/18 1008 12/30/18 0228  WBC 12.3* 8.6 9.4 6.8 13.1* 9.4  NEUTROABS 10.8* 7.6  --   --   --   --   HGB 9.6* 8.1* 7.8* 8.0* 9.2* 8.8*  HCT 34.1* 28.1* 26.5* 27.9* 30.1* 31.0*  MCV 101.5* 99.6 97.4 98.6 99.0 100.3*  PLT 250 194 190 185 210 419   Basic Metabolic Panel: Recent Labs  Lab 12/26/18 0831 12/26/18 2030 12/27/18 0300 12/28/18 0345 12/29/18 0312 12/30/18 0228  NA 141 140  --  139 140 142  K 5.1 4.9  --  4.3 4.9 4.8  CL 93* 92*  --  95* 94* 95*  CO2 43* 39*  --  38* 38* 42*  GLUCOSE 282* 167*  --  147* 294* 117*  BUN 16 17  --  15 26* 19  CREATININE 0.74 0.75  --  0.54* 0.73 0.54*  CALCIUM 9.2 9.4  --  9.2 9.4 9.3  MG  --   --  1.9 1.7 2.1 1.9   GFR: CrCl cannot be calculated (Unknown ideal weight.). Liver Function Tests: Recent Labs  Lab 12/20/2018 2102 12/26/18 0831 12/26/18  2030  AST 137* 51* 37  ALT 110* 83* 72*  ALKPHOS 61 49 54  BILITOT 0.8 0.5 0.4  PROT 6.6 5.6* 5.9*  ALBUMIN 2.5* 2.1* 2.3*   No results for input(s): LIPASE, AMYLASE in the last 168 hours. Recent Labs  Lab 12/26/18 0042  AMMONIA 27   Coagulation Profile: No results for input(s): INR, PROTIME in the last 168 hours. Cardiac Enzymes: Recent Labs  Lab 12/26/18 0645 12/26/18 1124 12/26/18 1438 12/26/18 2030 12/27/18 0300  TROPONINI 0.31* 0.36* 0.31* 0.28* 0.20*   BNP (last 3 results) No results for input(s): PROBNP in the last 8760 hours. HbA1C: No results for input(s): HGBA1C in the last 72 hours. CBG: Recent Labs  Lab 12/29/18 1941 12/30/18 0047 12/30/18 0547 12/30/18 0801 12/30/18 1120  GLUCAP 161* 138* 81 84 191*   Lipid Profile: Recent Labs    12/28/18 0345  CHOL 137  HDL 57  LDLCALC 66  TRIG 68  CHOLHDL 2.4  Thyroid Function Tests: No results for input(s): TSH, T4TOTAL, FREET4, T3FREE, THYROIDAB in the last 72 hours. Anemia Panel: No results for input(s): VITAMINB12, FOLATE, FERRITIN, TIBC, IRON, RETICCTPCT in the last 72 hours. Sepsis Labs: Recent Labs  Lab 12/26/18 0042 12/26/18 0320 12/26/18 1438 12/26/18 2030  PROCALCITON 0.17  --   --   --   LATICACIDVEN 1.2 3.4* 2.4* 0.9    Recent Results (from the past 240 hour(s))  SARS Coronavirus 2 [facility] order, Performed in [place] hospital lab)     Status: None   Collection Time: 12/14/2018  8:45 PM  Result Value Ref Range Status   SARS Coronavirus 2 NEGATIVE NEGATIVE Final    Comment: (NOTE) If result is NEGATIVE SARS-CoV-2 target nucleic acids are NOT DETECTED. The SARS-CoV-2 RNA is generally detectable in upper and lower  respiratory specimens during the acute phase of infection. The lowest  concentration of SARS-CoV-2 viral copies this assay can detect is 250  copies / mL. A negative result does not preclude SARS-CoV-2 infection  and should not be used as the sole basis for treatment  or other  patient management decisions.  A negative result may occur with  improper specimen collection / handling, submission of specimen other  than nasopharyngeal swab, presence of viral mutation(s) within the  areas targeted by this assay, and inadequate number of viral copies  (<250 copies / mL). A negative result must be combined with clinical  observations, patient history, and epidemiological information. If result is POSITIVE SARS-CoV-2 target nucleic acids are DETECTED. The SARS-CoV-2 RNA is generally detectable in upper and lower  respiratory specimens dur ing the acute phase of infection.  Positive  results are indicative of active infection with SARS-CoV-2.  Clinical  correlation with patient history and other diagnostic information is  necessary to determine patient infection status.  Positive results do  not rule out bacterial infection or co-infection with other viruses. If result is PRESUMPTIVE POSTIVE SARS-CoV-2 nucleic acids MAY BE PRESENT.   A presumptive positive result was obtained on the submitted specimen  and confirmed on repeat testing.  While 2019 novel coronavirus  (SARS-CoV-2) nucleic acids may be present in the submitted sample  additional confirmatory testing may be necessary for epidemiological  and / or clinical management purposes  to differentiate between  SARS-CoV-2 and other Sarbecovirus currently known to infect humans.  If clinically indicated additional testing with an alternate test  methodology [phone]) is advised. The SARS-CoV-2 RNA is generally  detectable in upper and lower respiratory sp ecimens during the acute  phase of infection. The expected result is Negative. Fact Sheet for Patients:  StrictlyIdeas.no Fact Sheet for Healthcare Providers: BankingDealers.co.za This test is not yet approved or cleared by the [place] FDA and has been authorized for detection and/or diagnosis of  SARS-CoV-2 by FDA under an Emergency Use Authorization (EUA).  This EUA will remain in effect (meaning this test can be used) for the duration of the COVID-19 declaration under Section 564(b)(1) of the Act, 21 U.S.C. section 360bbb-3(b)(1), unless the authorization is terminated or revoked sooner. Performed at [facility], [place] [address] [place], [place] [address]   Blood Culture (routine x 2)     Status: Abnormal   Collection Time: 12/17/2018  8:55 PM  Result Value Ref Range Status   Specimen Description BLOOD LEFT ARM  Final   Special Requests   Final    BOTTLES DRAWN AEROBIC AND ANAEROBIC Blood Culture results may not be optimal due  to an inadequate volume of blood received in culture bottles   Culture  Setup Time   Final    GRAM POSITIVE COCCI AEROBIC BOTTLE ONLY CRITICAL RESULT CALLED TO, READ BACK BY AND VERIFIED WITH: J ORIET PHARMD 12/28/18 0013 JDW    Culture (A)  Final    STAPHYLOCOCCUS SPECIES (COAGULASE NEGATIVE) THE SIGNIFICANCE OF ISOLATING THIS ORGANISM FROM A SINGLE SET OF BLOOD CULTURES WHEN MULTIPLE SETS ARE DRAWN IS UNCERTAIN. PLEASE NOTIFY THE MICROBIOLOGY DEPARTMENT WITHIN ONE WEEK IF SPECIATION AND SENSITIVITIES ARE REQUIRED. Performed at [facility], [place] [address], [place], [place] [address]    Report Status 12/29/2018 FINAL  Final  Blood Culture ID Panel (Reflexed)     Status: Abnormal   Collection Time: 12/07/2018  8:55 PM  Result Value Ref Range Status   Enterococcus species NOT DETECTED NOT DETECTED Final   Listeria monocytogenes NOT DETECTED NOT DETECTED Final   Staphylococcus species DETECTED (A) NOT DETECTED Final    Comment: Methicillin (oxacillin) resistant coagulase negative staphylococcus. Possible blood culture contaminant (unless isolated from more than one blood culture draw or clinical case suggests pathogenicity). No antibiotic treatment is indicated for blood  culture contaminants.    Staphylococcus aureus (BCID) NOT DETECTED NOT  DETECTED Final   Methicillin resistance DETECTED (A) NOT DETECTED Final    Comment: CRITICAL RESULT CALLED TO, READ BACK BY AND VERIFIED WITH: J ORIET PHARMD 12/28/18 0013 JDW    Streptococcus species NOT DETECTED NOT DETECTED Final   Streptococcus agalactiae NOT DETECTED NOT DETECTED Final   Streptococcus pneumoniae NOT DETECTED NOT DETECTED Final   Streptococcus pyogenes NOT DETECTED NOT DETECTED Final   Acinetobacter baumannii NOT DETECTED NOT DETECTED Final   Enterobacteriaceae species NOT DETECTED NOT DETECTED Final   Enterobacter cloacae complex NOT DETECTED NOT DETECTED Final   Escherichia coli NOT DETECTED NOT DETECTED Final   Klebsiella oxytoca NOT DETECTED NOT DETECTED Final   Klebsiella pneumoniae NOT DETECTED NOT DETECTED Final   Proteus species NOT DETECTED NOT DETECTED Final   Serratia marcescens NOT DETECTED NOT DETECTED Final   Haemophilus influenzae NOT DETECTED NOT DETECTED Final   Neisseria meningitidis NOT DETECTED NOT DETECTED Final   Pseudomonas aeruginosa NOT DETECTED NOT DETECTED Final   Candida albicans NOT DETECTED NOT DETECTED Final   Candida glabrata NOT DETECTED NOT DETECTED Final   Candida krusei NOT DETECTED NOT DETECTED Final   Candida parapsilosis NOT DETECTED NOT DETECTED Final   Candida tropicalis NOT DETECTED NOT DETECTED Final    Comment: Performed at [facility], [place]. [address] [place], [place] [address]  Blood Culture (routine x 2)     Status: None   Collection Time: 12/12/2018  9:00 PM  Result Value Ref Range Status   Specimen Description BLOOD RIGHT ARM  Final   Special Requests   Final    BOTTLES DRAWN AEROBIC AND ANAEROBIC Blood Culture results may not be optimal due to an excessive volume of blood received in culture bottles   Culture   Final    NO GROWTH 5 DAYS Performed at [facility], [place] [address], [place], [place] [address]    Report Status 12/30/2018 FINAL  Final  Respiratory Panel by PCR     Status: Abnormal    Collection Time: 12/26/18  1:12 AM  Result Value Ref Range Status   Adenovirus NOT DETECTED NOT DETECTED Final   Coronavirus 229E NOT DETECTED NOT DETECTED Final    Comment: (NOTE) The Coronavirus on the  Respiratory Panel, DOES NOT test for the novel  Coronavirus (2019 nCoV)    Coronavirus HKU1 DETECTED (A) NOT DETECTED Final   Coronavirus NL63 NOT DETECTED NOT DETECTED Final   Coronavirus OC43 NOT DETECTED NOT DETECTED Final   Metapneumovirus NOT DETECTED NOT DETECTED Final   Rhinovirus / Enterovirus NOT DETECTED NOT DETECTED Final   Influenza A NOT DETECTED NOT DETECTED Final   Influenza B NOT DETECTED NOT DETECTED Final   Parainfluenza Virus 1 NOT DETECTED NOT DETECTED Final   Parainfluenza Virus 2 NOT DETECTED NOT DETECTED Final   Parainfluenza Virus 3 NOT DETECTED NOT DETECTED Final   Parainfluenza Virus 4 NOT DETECTED NOT DETECTED Final   Respiratory Syncytial Virus NOT DETECTED NOT DETECTED Final   Bordetella pertussis NOT DETECTED NOT DETECTED Final   Chlamydophila pneumoniae NOT DETECTED NOT DETECTED Final   Mycoplasma pneumoniae NOT DETECTED NOT DETECTED Final    Comment: Performed at [facility], [place] [address], [place], [place] [address]  Urine culture     Status: Abnormal   Collection Time: 12/26/18  2:59 AM  Result Value Ref Range Status   Specimen Description URINE, CLEAN CATCH  Final   Special Requests NONE  Final   Culture (A)  Final    <10,000 COLONIES/mL INSIGNIFICANT GROWTH Performed at [facility], [place] [address] [place], [place] [address]    Report Status 12/26/2018 FINAL  Final  MRSA PCR Screening     Status: None   Collection Time: 12/26/18  4:45 PM  Result Value Ref Range Status   MRSA by PCR NEGATIVE NEGATIVE Final    Comment:        The GeneXpert MRSA Assay (FDA approved for NASAL specimens only), is one component of a comprehensive MRSA colonization surveillance program. It is not intended to diagnose MRSA infection nor to guide  or monitor treatment for MRSA infections. Performed at [facility], [place] [address] [place], [place] [address]          Radiology Studies: No results found.      Scheduled Meds: . apixaban  5 mg Oral BID  . aspirin EC  81 mg Oral Daily  . atorvastatin  20 mg Oral q1800  . dextromethorphan-guaiFENesin  1 tablet Oral BID  . famotidine  20 mg Oral Daily  . feeding supplement (PRO-STAT SUGAR FREE 64)  30 mL Oral BID  . insulin aspart  0-20 Units Subcutaneous Q4H  . insulin glargine  30 Units Subcutaneous Q24H  . ipratropium-albuterol  3 mL Nebulization TID  . methylPREDNISolone (SOLU-MEDROL) injection  60 mg Intravenous BID  . Ensure Max Protein  11 oz Oral Daily  . sertraline  50 mg Oral Daily   Continuous Infusions: . sodium chloride 75 mL/hr at 12/29/18 1925  . azithromycin 500 mg (12/29/18 2238)  . cefTRIAXone (ROCEPHIN)  IV 2 g (12/29/18 1543)     LOS: 4 days    Time spent: 16 min    Nicolette Bang, MD [facility]  If 7PM-7AM, please contact night-coverage  12/30/2018, 11:31 AM

## 2018-12-31 ENCOUNTER — Inpatient Hospital Stay (HOSPITAL_COMMUNITY): Payer: Medicare Other

## 2018-12-31 LAB — CBC
HCT: 28.6 % — ABNORMAL LOW (ref 39.0–52.0)
Hemoglobin: 8.2 g/dL — ABNORMAL LOW (ref 13.0–17.0)
MCH: 29.1 pg (ref 26.0–34.0)
MCHC: 28.7 g/dL — ABNORMAL LOW (ref 30.0–36.0)
MCV: 101.4 fL — ABNORMAL HIGH (ref 80.0–100.0)
Platelets: 176 10*3/uL (ref 150–400)
RBC: 2.82 MIL/uL — ABNORMAL LOW (ref 4.22–5.81)
RDW: 15.3 % (ref 11.5–15.5)
WBC: 8.7 10*3/uL (ref 4.0–10.5)
nRBC: 0 % (ref 0.0–0.2)

## 2018-12-31 LAB — BASIC METABOLIC PANEL
Anion gap: 9 (ref 5–15)
BUN: 18 mg/dL (ref 8–23)
CO2: 40 mmol/L — ABNORMAL HIGH (ref 22–32)
Calcium: 9.3 mg/dL (ref 8.9–10.3)
Chloride: 93 mmol/L — ABNORMAL LOW (ref 98–111)
Creatinine, Ser: 0.49 mg/dL — ABNORMAL LOW (ref 0.61–1.24)
GFR calc Af Amer: >60 mL/min (ref >60)
GFR calc non Af Amer: >60 mL/min (ref >60)
Glucose, Bld: 123 mg/dL — ABNORMAL HIGH (ref 70–99)
Potassium: 4.4 mmol/L (ref 3.5–5.1)
Sodium: 142 mmol/L (ref 135–145)

## 2018-12-31 LAB — GLUCOSE, CAPILLARY
Glucose-Capillary: 122 mg/dL — ABNORMAL HIGH (ref 70–99)
Glucose-Capillary: 149 mg/dL — ABNORMAL HIGH (ref 70–99)
Glucose-Capillary: 192 mg/dL — ABNORMAL HIGH (ref 70–99)
Glucose-Capillary: 215 mg/dL — ABNORMAL HIGH (ref 70–99)
Glucose-Capillary: 221 mg/dL — ABNORMAL HIGH (ref 70–99)
Glucose-Capillary: 60 mg/dL — ABNORMAL LOW (ref 70–99)
Glucose-Capillary: 75 mg/dL (ref 70–99)

## 2018-12-31 LAB — MAGNESIUM: Magnesium: 1.8 mg/dL (ref 1.7–2.4)

## 2018-12-31 MED ORDER — CEFDINIR 300 MG PO CAPS: 300.0000 mg | ORAL_CAPSULE | Freq: Two times a day (BID) | ORAL | 0 refills | Status: DC

## 2018-12-31 MED ORDER — AZITHROMYCIN 500 MG PO TABS: 500.0000 mg | ORAL_TABLET | Freq: Every day | ORAL | 0 refills | Status: DC

## 2018-12-31 MED ORDER — PIPERACILLIN-TAZOBACTAM 3.375 G IVPB
3.3750 g | Freq: Three times a day (TID) | INTRAVENOUS | Status: AC
  Administered 2019-01-01 – 2019-01-07 (×18): 3.375 g via INTRAVENOUS
  Filled 2018-12-31 (×21): qty 50

## 2018-12-31 MED ORDER — FLUTICASONE PROPIONATE 50 MCG/ACT NA SUSP
2.0000 | Freq: Every day | NASAL | Status: DC
  Administered 2018-12-31 – 2019-01-12 (×13): 2 via NASAL
  Filled 2018-12-31 (×2): qty 16

## 2018-12-31 MED ORDER — ASPIRIN 81 MG PO TBEC: 81.0000 mg | DELAYED_RELEASE_TABLET | Freq: Every day | ORAL | 0 refills | Status: DC

## 2018-12-31 NOTE — Progress Notes (Signed)
PCXR into room.

## 2018-12-31 NOTE — Progress Notes (Signed)
CSW was notified by RN patient may not be discharging, CSW cancelled discharge with PTAR.  CSW gave nurse phone number for PTAR to call later this evening if patient is medically stable to discharge. PTAR number is [phone], press 1 for English, then 3 for transportation. Will need SSN (written on PTAR form) and notify of patient going to [facility] and Room # 107.   Please notify CSW for further needs.  [place], [place] [phone]

## 2018-12-31 NOTE — Progress Notes (Signed)
PROGRESS NOTE    Gregory Stone  XIH:[id number] DOB: 28-Aug-1946 DOA: 12/05/2018 PCP: Patient, No Pcp Per   Brief Narrative:  Gregory Stone a 73 y.o.BM PMHx hyperlipidemia, diabetes mellitus type II uncontrolled with complication, COPDon3 L O2 at home, atrial fibrillation on Eliquis,depression,  Presents with altered mental status and shortness of breath. Pt has AMS and isunable to provide any medical history, therefore, most of the history is obtained by discussing the case with ED physician, per EMS report, and with the nursing staff.  Per report, ptdeveloped shortness of breathin the facility,found to have oxygen desaturation to 50%. Pt wasgiven breathing treatment and also treated with a dose of Ativan, then patient became minimally responsive.When I saw pt in ED, he is barelyarousable, seems to move extremities upon painful stimuli. Patient has respiratory distress, but not actively coughing. No active nausea,vomiting or diarrhea noted. Not sure if patient has any pain anywhere.  ED Course:pt was found to have negative COVID19 test, negative Flu PCR,WBC 12.3, lactic acid of 4.9, troponin 0 0.03, urinalysis (cloudy appearance, rare bacteria, WBC 11-20), potassium 5.2, creatinine 0.89, BUN 18, abnormal liver function (ALP 61, AST 137, ALT 110, total bilirubin 0.8), ammonia level 27, temperature normal, atrial fibrillation with RVR, tachypnea, oxygen saturation 100% on 5 L nasal cannula oxygen,ABG withpH 7.182,pCO2103, PO2 142. Chest x-ray showed left upper lobe infiltration. CT head negative for acute intracranial abnormalities. Patient is admitted to stepdown as inpatient.   Assessment & Plan:   Principal Problem:   HCAP (healthcare-associated pneumonia) Active Problems:   HLD (hyperlipidemia)   Diabetes mellitus without complication (HCC)   Depression   COPD (chronic obstructive pulmonary disease) (HCC)   Atrial fibrillation with RVR (HCC)   Acute on  chronic respiratory failure with hypoxia (HCC)   Acute metabolic encephalopathy   Sepsis (HCC)   Hyperkalemia   Abnormal LFTs   Elevated troponin   COPD exacerbation (HCC)   Sepsis due to pneumonia (HCC)   Diabetes mellitus type 2, uncontrolled, with complications ([place])   Anxiety   Sepsis pneumonia/HCAP As previously noted: - CXR showed LUL infiltration. - On admission met criteria for sepsis, leukocytosis and tachycardia. Lactic acid elevated 4.9 -COVID 19negative - PositiveCoronavirus H KU 1 -Urine strep pneumo/Legionella negative -Complete 7 days antibioticsday #6, changed to zosyn 2/2 cancelled d/c and cxr with pna -Continue withSolu-Medrol 60 mg to BID but decrease to 39m - DuoNeb QID - Albuterol neb as needed -Flutter valve -Mucinex DM twice daily - 4/30 PT/OT: Likely return to patient's prior skilled nursing facility [facility] -Out of bed every shift  COPD exacerbation - See sepsis pneumonia  Respiratory failure with hypoxia and hypercapnia -Resolved -See sepsis pneumonia - Titrate O2 to maintain SPO2 89 to 93% -Patient is on 3 L home O2 at baseline  Acute metabolic encephalopathy -Multifactorial sepsis, hypoxia. -Resolved  A. fib with RVR -CHA2DS2-VASc=2 - Most likely secondary to sepsis -Currently NSR - 4/29 restartedhome Eliquis. DiscontinuedLovenox  Elevated troponin - Most likely demand ischemia troponins rose to 0.36. Patient asymptomatic on exam. -EKG at presentation sinus tachycardia no previous EKG for comparison LastLabs         Recent Labs  Lab 12/26/18 0645 12/26/18 1124 12/26/18 1438 12/26/18 2030 12/27/18 0300  TROPONINI 0.31* 0.36* 0.31* 0.28* 0.20*    -Troponin resolving -No anginal symptomatology  Diabetes type 2 uncontrolled with complication - 43/49Hemoglobin A1c= 7.6 -4/30 Lantus 30 units daily -4/30 resistant SSI  HLD - LDL at the ADA guidelines -4/30 startedLipitor  Lactic  acidosis - Resolved  Depression/anxiety - Zoloft 50 mg daily -Cont to hold home Ativanfor now  Hyperkalemia -4.8 -Resolved monitor closely  Hypomagnesmia - Magnesium goal>2 - Magnesium 1.9g, recheck in AM  Abnormal LFTs -Most likely secondary sepsis will follow   DVT prophylaxis: CZ:[id number]  Code Status:     Code Status Orders  (From admission, onward)         Start     Ordered   12/26/18 0016  Do not attempt resuscitation (DNR)  Continuous    Question Answer Comment  In the event of cardiac or respiratory ARREST Do not call a "code blue"   In the event of cardiac or respiratory ARREST Do not perform Intubation, CPR, defibrillation or ACLS   In the event of cardiac or respiratory ARREST Use medication by any route, position, wound care, and other measures to relive pain and suffering. May use oxygen, suction and manual treatment of airway obstruction as needed for comfort.   Comments confirmed with daughter, active DNR in place. OK with bipap      12/26/18 0015        Code Status History    Date Active Date Inactive Code Status Order ID Comments User Context   12/12/2018 2106 12/26/2018 0015 DNR [id number]  Deno Etienne, DO ED   11/19/2018 1531 11/30/2018 0110 Full Code [id number]  [name] Inpatient   11/19/2018 0728 11/19/2018 1531 DNR [id number]  [name] Inpatient     Family Communication: none today  Disposition Plan:   Patient was planned for discharge today, unfortunately will continue inpatient secondary to hypoxic respiratory failure.  Patient's discharge complicated by worsening respiratory status afternoon requiring 8 L high flow oxygen.  He will need continued high flow oxygen, frequent respiratory monitoring, frequent nursing care, changed IV antibiotic,.  Without these treatments patient be at risk of respiratory compromise and respiratory failure. Consults called: None Admission status: Inpatient   Consultants:   None   Procedures:  Ct Head Wo Contrast  Result Date: 12/26/2018 CLINICAL DATA:  73 year old male with ataxia, encephalopathy, atrial fibrillation. EXAM: CT HEAD WITHOUT CONTRAST TECHNIQUE: Contiguous axial images were obtained from the base of the skull through the vertex without intravenous contrast. COMPARISON:  None. FINDINGS: Brain: Cerebral volume is within normal limits for age. No midline shift, ventriculomegaly, mass effect, evidence of mass lesion, intracranial hemorrhage or evidence of cortically based acute infarction. Patchy periatrial white matter hypodensity. No cortical encephalomalacia. Vascular: Calcified atherosclerosis at the skull base. No suspicious intracranial vascular hyperdensity. Skull: Negative. Sinuses/Orbits: Visualized paranasal sinuses and mastoids are well pneumatized. Other: Negative orbits. Visualized scalp soft tissues are within normal limits. IMPRESSION: 1. No acute intracranial abnormality. 2. Mild for age nonspecific cerebral white matter changes, most commonly due to small vessel disease. Electronically Signed   By: Genevie Ann M.D.   On: 12/26/2018 01:49   Dg Chest Port 1 View  Result Date: 12/31/2018 CLINICAL DATA:  Dyspnea at rest EXAM: PORTABLE CHEST 1 VIEW COMPARISON:  12/01/2018 chest radiograph. FINDINGS: Stable cardiomediastinal silhouette with normal heart size. No pneumothorax. No left pleural effusion. Chronic blunting of the right costophrenic angle back to 11/26/2018. Hazy upper left lung and right lung base opacity is stable. New left retrocardiac consolidation. IMPRESSION: 1. New left retrocardiac consolidation. Stable hazy upper left lung and right lung base opacity. Findings suggest multilobar pneumonia. 2. Chronic blunting of the right costophrenic angle back to 11/26/2026 radiograph, compatible with small pleural effusion and/or pleural-parenchymal scarring. Electronically  Signed   By: Ilona Sorrel M.D.   On: 12/31/2018 15:01   Dg Chest Port 1 View  Result  Date: 11/30/2018 CLINICAL DATA:  73 year old male with sepsis. EXAM: PORTABLE CHEST 1 VIEW COMPARISON:  11/26/2018 and earlier. FINDINGS: Portable AP supine view at 2057 hours. Stable large lung volumes. Stable cardiac size and mediastinal contours. Visualized tracheal air column is within normal limits. No pneumothorax, pleural effusion or consolidation. New asymmetric reticulonodular opacity in the left upper lung. No acute osseous abnormality identified. IMPRESSION: New left upper lobe reticulonodular pulmonary opacity suspicious for acute viral/atypical respiratory infection. Underlying pulmonary hyperinflation. Electronically Signed   By: Genevie Ann M.D.   On: 12/07/2018 22:02     Antimicrobials:   Ceftriaxone azithromycin day 6 last day May 3  Zosyn started May 3   Subjective: Patient was scheduled discharge today was stable this morning on home O2 levels unfortunately became progressively worse this afternoon with O2 sats dropping into the 70s.  Patient recovered with nebulizers and high flow oxygen but chest x-ray revealed new pneumonia.  Objective: Vitals:   12/31/18 0800 12/31/18 1216 12/31/18 1355 12/31/18 1414  BP:  (!) 175/84    Pulse: 79 92  96  Resp: (!) 21 19  (!) 24  Temp:  97.9 F (36.6 C)    TempSrc:  Oral    SpO2: 100% 97% 97% 97%  Weight:        Intake/Output Summary (Last 24 hours) at 12/31/2018 1514 Last data filed at 12/31/2018 1100 Gross per 24 hour  Intake 600 ml  Output 1700 ml  Net -1100 ml   Filed Weights   12/29/18 0314 12/30/18 0549 12/31/18 0444  Weight: 67.5 kg 69.6 kg 68.6 kg    Examination:  General exam: Appears calm and comfortable  Respiratory system: Clear to auscultation this morning,  no accessory muscle use this morning, declined any afternoon with rhonchi bilaterally. Cardiovascular system: S1 & S2 heard, RRR. No JVD, murmurs, rubs, gallops or clicks. No pedal edema. Gastrointestinal system: Abdomen is nondistended, soft and nontender.  No organomegaly or masses felt. Normal bowel sounds heard. Central nervous system: Alert and oriented. No focal neurological deficits. Extremities: wwp, no edema Skin: No rashes, lesions or ulcers Psychiatry: Judgement and insight appear normal. Mood & affect appropriate.     Data Reviewed: I have personally reviewed following labs and imaging studies  CBC: Recent Labs  Lab 11/30/2018 2102 12/26/18 0831 12/27/18 0300 12/28/18 0345 12/29/18 1008 12/30/18 0228 12/31/18 0403  WBC 12.3* 8.6 9.4 6.8 13.1* 9.4 8.7  NEUTROABS 10.8* 7.6  --   --   --   --   --   HGB 9.6* 8.1* 7.8* 8.0* 9.2* 8.8* 8.2*  HCT 34.1* 28.1* 26.5* 27.9* 30.1* 31.0* 28.6*  MCV 101.5* 99.6 97.4 98.6 99.0 100.3* 101.4*  PLT 250 194 190 185 210 180 341   Basic Metabolic Panel: Recent Labs  Lab 12/26/18 2030 12/27/18 0300 12/28/18 0345 12/29/18 0312 12/30/18 0228 12/31/18 0403  NA 140  --  139 140 142 142  K 4.9  --  4.3 4.9 4.8 4.4  CL 92*  --  95* 94* 95* 93*  CO2 39*  --  38* 38* 42* 40*  GLUCOSE 167*  --  147* 294* 117* 123*  BUN 17  --  15 26* 19 18  CREATININE 0.75  --  0.54* 0.73 0.54* 0.49*  CALCIUM 9.4  --  9.2 9.4 9.3 9.3  MG  --  1.9  1.7 2.1 1.9 1.8   GFR: CrCl cannot be calculated (Unknown ideal weight.). Liver Function Tests: Recent Labs  Lab 12/17/2018 2102 12/26/18 0831 12/26/18 2030  AST 137* 51* 37  ALT 110* 83* 72*  ALKPHOS 61 49 54  BILITOT 0.8 0.5 0.4  PROT 6.6 5.6* 5.9*  ALBUMIN 2.5* 2.1* 2.3*   No results for input(s): LIPASE, AMYLASE in the last 168 hours. Recent Labs  Lab 12/26/18 0042  AMMONIA 27   Coagulation Profile: No results for input(s): INR, PROTIME in the last 168 hours. Cardiac Enzymes: Recent Labs  Lab 12/26/18 0645 12/26/18 1124 12/26/18 1438 12/26/18 2030 12/27/18 0300  TROPONINI 0.31* 0.36* 0.31* 0.28* 0.20*   BNP (last 3 results) No results for input(s): PROBNP in the last 8760 hours. HbA1C: No results for input(s): HGBA1C in the last 72  hours. CBG: Recent Labs  Lab 12/31/18 0021 12/31/18 0436 12/31/18 0744 12/31/18 0817 12/31/18 1211  GLUCAP 192* 122* 60* 75 149*   Lipid Profile: No results for input(s): CHOL, HDL, LDLCALC, TRIG, CHOLHDL, LDLDIRECT in the last 72 hours. Thyroid Function Tests: No results for input(s): TSH, T4TOTAL, FREET4, T3FREE, THYROIDAB in the last 72 hours. Anemia Panel: No results for input(s): VITAMINB12, FOLATE, FERRITIN, TIBC, IRON, RETICCTPCT in the last 72 hours. Sepsis Labs: Recent Labs  Lab 12/26/18 0042 12/26/18 0320 12/26/18 1438 12/26/18 2030  PROCALCITON 0.17  --   --   --   LATICACIDVEN 1.2 3.4* 2.4* 0.9    Recent Results (from the past 240 hour(s))  SARS Coronavirus 2 [facility] order, Performed in [place] hospital lab)     Status: None   Collection Time: 12/10/2018  8:45 PM  Result Value Ref Range Status   SARS Coronavirus 2 NEGATIVE NEGATIVE Final    Comment: (NOTE) If result is NEGATIVE SARS-CoV-2 target nucleic acids are NOT DETECTED. The SARS-CoV-2 RNA is generally detectable in upper and lower  respiratory specimens during the acute phase of infection. The lowest  concentration of SARS-CoV-2 viral copies this assay can detect is 250  copies / mL. A negative result does not preclude SARS-CoV-2 infection  and should not be used as the sole basis for treatment or other  patient management decisions.  A negative result may occur with  improper specimen collection / handling, submission of specimen other  than nasopharyngeal swab, presence of viral mutation(s) within the  areas targeted by this assay, and inadequate number of viral copies  (<250 copies / mL). A negative result must be combined with clinical  observations, patient history, and epidemiological information. If result is POSITIVE SARS-CoV-2 target nucleic acids are DETECTED. The SARS-CoV-2 RNA is generally detectable in upper and lower  respiratory specimens dur ing the acute phase of infection.   Positive  results are indicative of active infection with SARS-CoV-2.  Clinical  correlation with patient history and other diagnostic information is  necessary to determine patient infection status.  Positive results do  not rule out bacterial infection or co-infection with other viruses. If result is PRESUMPTIVE POSTIVE SARS-CoV-2 nucleic acids MAY BE PRESENT.   A presumptive positive result was obtained on the submitted specimen  and confirmed on repeat testing.  While 2019 novel coronavirus  (SARS-CoV-2) nucleic acids may be present in the submitted sample  additional confirmatory testing may be necessary for epidemiological  and / or clinical management purposes  to differentiate between  SARS-CoV-2 and other Sarbecovirus currently known to infect humans.  If clinically indicated additional testing with an alternate  test  methodology [phone]) is advised. The SARS-CoV-2 RNA is generally  detectable in upper and lower respiratory sp ecimens during the acute  phase of infection. The expected result is Negative. Fact Sheet for Patients:  StrictlyIdeas.no Fact Sheet for Healthcare Providers: BankingDealers.co.za This test is not yet approved or cleared by the [place] FDA and has been authorized for detection and/or diagnosis of SARS-CoV-2 by FDA under an Emergency Use Authorization (EUA).  This EUA will remain in effect (meaning this test can be used) for the duration of the COVID-19 declaration under Section 564(b)(1) of the Act, 21 U.S.C. section 360bbb-3(b)(1), unless the authorization is terminated or revoked sooner. Performed at [facility], [place] [address], [place], [place] [address]   Blood Culture (routine x 2)     Status: Abnormal   Collection Time: 12/06/2018  8:55 PM  Result Value Ref Range Status   Specimen Description BLOOD LEFT ARM  Final   Special Requests   Final    BOTTLES DRAWN AEROBIC AND ANAEROBIC Blood  Culture results may not be optimal due to an inadequate volume of blood received in culture bottles   Culture  Setup Time   Final    GRAM POSITIVE COCCI AEROBIC BOTTLE ONLY CRITICAL RESULT CALLED TO, READ BACK BY AND VERIFIED WITH: J ORIET PHARMD 12/28/18 0013 JDW    Culture (A)  Final    STAPHYLOCOCCUS SPECIES (COAGULASE NEGATIVE) THE SIGNIFICANCE OF ISOLATING THIS ORGANISM FROM A SINGLE SET OF BLOOD CULTURES WHEN MULTIPLE SETS ARE DRAWN IS UNCERTAIN. PLEASE NOTIFY THE MICROBIOLOGY DEPARTMENT WITHIN ONE WEEK IF SPECIATION AND SENSITIVITIES ARE REQUIRED. Performed at [facility], [place] [address] [place], [place] [address]    Report Status 12/29/2018 FINAL  Final  Blood Culture ID Panel (Reflexed)     Status: Abnormal   Collection Time: 12/06/2018  8:55 PM  Result Value Ref Range Status   Enterococcus species NOT DETECTED NOT DETECTED Final   Listeria monocytogenes NOT DETECTED NOT DETECTED Final   Staphylococcus species DETECTED (A) NOT DETECTED Final    Comment: Methicillin (oxacillin) resistant coagulase negative staphylococcus. Possible blood culture contaminant (unless isolated from more than one blood culture draw or clinical case suggests pathogenicity). No antibiotic treatment is indicated for blood  culture contaminants.    Staphylococcus aureus (BCID) NOT DETECTED NOT DETECTED Final   Methicillin resistance DETECTED (A) NOT DETECTED Final    Comment: CRITICAL RESULT CALLED TO, READ BACK BY AND VERIFIED WITH: J ORIET PHARMD 12/28/18 0013 JDW    Streptococcus species NOT DETECTED NOT DETECTED Final   Streptococcus agalactiae NOT DETECTED NOT DETECTED Final   Streptococcus pneumoniae NOT DETECTED NOT DETECTED Final   Streptococcus pyogenes NOT DETECTED NOT DETECTED Final   Acinetobacter baumannii NOT DETECTED NOT DETECTED Final   Enterobacteriaceae species NOT DETECTED NOT DETECTED Final   Enterobacter cloacae complex NOT DETECTED NOT DETECTED Final   Escherichia coli NOT  DETECTED NOT DETECTED Final   Klebsiella oxytoca NOT DETECTED NOT DETECTED Final   Klebsiella pneumoniae NOT DETECTED NOT DETECTED Final   Proteus species NOT DETECTED NOT DETECTED Final   Serratia marcescens NOT DETECTED NOT DETECTED Final   Haemophilus influenzae NOT DETECTED NOT DETECTED Final   Neisseria meningitidis NOT DETECTED NOT DETECTED Final   Pseudomonas aeruginosa NOT DETECTED NOT DETECTED Final   Candida albicans NOT DETECTED NOT DETECTED Final   Candida glabrata NOT DETECTED NOT DETECTED Final   Candida krusei NOT DETECTED NOT DETECTED Final   Candida parapsilosis  NOT DETECTED NOT DETECTED Final   Candida tropicalis NOT DETECTED NOT DETECTED Final    Comment: Performed at [facility], [place] [address] [place], [place] [address]  Blood Culture (routine x 2)     Status: None   Collection Time: 12/05/2018  9:00 PM  Result Value Ref Range Status   Specimen Description BLOOD RIGHT ARM  Final   Special Requests   Final    BOTTLES DRAWN AEROBIC AND ANAEROBIC Blood Culture results may not be optimal due to an excessive volume of blood received in culture bottles   Culture   Final    NO GROWTH 5 DAYS Performed at [facility], [place] [address], [place], [place] [address]    Report Status 12/30/2018 FINAL  Final  Respiratory Panel by PCR     Status: Abnormal   Collection Time: 12/26/18  1:12 AM  Result Value Ref Range Status   Adenovirus NOT DETECTED NOT DETECTED Final   Coronavirus 229E NOT DETECTED NOT DETECTED Final    Comment: (NOTE) The Coronavirus on the Respiratory Panel, DOES NOT test for the novel  Coronavirus (2019 nCoV)    Coronavirus HKU1 DETECTED (A) NOT DETECTED Final   Coronavirus NL63 NOT DETECTED NOT DETECTED Final   Coronavirus OC43 NOT DETECTED NOT DETECTED Final   Metapneumovirus NOT DETECTED NOT DETECTED Final   Rhinovirus / Enterovirus NOT DETECTED NOT DETECTED Final   Influenza A NOT DETECTED NOT DETECTED Final   Influenza B NOT DETECTED NOT  DETECTED Final   Parainfluenza Virus 1 NOT DETECTED NOT DETECTED Final   Parainfluenza Virus 2 NOT DETECTED NOT DETECTED Final   Parainfluenza Virus 3 NOT DETECTED NOT DETECTED Final   Parainfluenza Virus 4 NOT DETECTED NOT DETECTED Final   Respiratory Syncytial Virus NOT DETECTED NOT DETECTED Final   Bordetella pertussis NOT DETECTED NOT DETECTED Final   Chlamydophila pneumoniae NOT DETECTED NOT DETECTED Final   Mycoplasma pneumoniae NOT DETECTED NOT DETECTED Final    Comment: Performed at [facility], [place] [address], [place], [place] [address]  Urine culture     Status: Abnormal   Collection Time: 12/26/18  2:59 AM  Result Value Ref Range Status   Specimen Description URINE, CLEAN CATCH  Final   Special Requests NONE  Final   Culture (A)  Final    <10,000 COLONIES/mL INSIGNIFICANT GROWTH Performed at [facility], [place] [address], [place], [place] [address]    Report Status 12/26/2018 FINAL  Final  MRSA PCR Screening     Status: None   Collection Time: 12/26/18  4:45 PM  Result Value Ref Range Status   MRSA by PCR NEGATIVE NEGATIVE Final    Comment:        The GeneXpert MRSA Assay (FDA approved for NASAL specimens only), is one component of a comprehensive MRSA colonization surveillance program. It is not intended to diagnose MRSA infection nor to guide or monitor treatment for MRSA infections. Performed at [facility], [place] [address] [place], [place] [address]   Culture, blood (routine x 2)     Status: None (Preliminary result)   Collection Time: 12/30/18  9:30 AM  Result Value Ref Range Status   Specimen Description BLOOD RIGHT THUMB  Final   Special Requests   Final    BOTTLES DRAWN AEROBIC ONLY Blood Culture results may not be optimal due to an inadequate volume of blood received in culture bottles   Culture   Final  NO GROWTH < 24 HOURS Performed at [place] [address] [place], [place] [address]    Report Status PENDING   Incomplete  Culture, blood (routine x 2)     Status: None (Preliminary result)   Collection Time: 12/30/18  4:32 PM  Result Value Ref Range Status   Specimen Description BLOOD SITE NOT SPECIFIED  Final   Special Requests AEROBIC BOTTLE ONLY Blood Culture adequate volume  Final   Culture   Final    NO GROWTH < 24 HOURS Performed at [facility], [place] [address], [place], [place] [address]    Report Status PENDING  Incomplete         Radiology Studies: Dg Chest Port 1 View  Result Date: 12/31/2018 CLINICAL DATA:  Dyspnea at rest EXAM: PORTABLE CHEST 1 VIEW COMPARISON:  12/10/2018 chest radiograph. FINDINGS: Stable cardiomediastinal silhouette with normal heart size. No pneumothorax. No left pleural effusion. Chronic blunting of the right costophrenic angle back to 11/26/2018. Hazy upper left lung and right lung base opacity is stable. New left retrocardiac consolidation. IMPRESSION: 1. New left retrocardiac consolidation. Stable hazy upper left lung and right lung base opacity. Findings suggest multilobar pneumonia. 2. Chronic blunting of the right costophrenic angle back to 11/26/2026 radiograph, compatible with small pleural effusion and/or pleural-parenchymal scarring. Electronically Signed   By: Ilona Sorrel M.D.   On: 12/31/2018 15:01        Scheduled Meds: . apixaban  5 mg Oral BID  . aspirin EC  81 mg Oral Daily  . atorvastatin  20 mg Oral q1800  . dextromethorphan-guaiFENesin  1 tablet Oral BID  . famotidine  20 mg Oral Daily  . feeding supplement (PRO-STAT SUGAR FREE 64)  30 mL Oral BID  . fluticasone  2 spray Each Nare Daily  . insulin aspart  0-20 Units Subcutaneous Q4H  . insulin glargine  30 Units Subcutaneous Q24H  . ipratropium-albuterol  3 mL Nebulization TID  . methylPREDNISolone (SOLU-MEDROL) injection  40 mg Intravenous BID  . Ensure Max Protein  11 oz Oral Daily  . sertraline  50 mg Oral Daily   Continuous Infusions: . sodium chloride 75 mL/hr at  12/31/18 0040  . azithromycin 500 mg (12/30/18 2239)  . cefTRIAXone (ROCEPHIN)  IV 2 g (12/30/18 1728)     LOS: 5 days    Time spent: 64 min    Nicolette Bang, MD [facility]  If 7PM-7AM, please contact night-coverage  12/31/2018, 3:14 PM

## 2018-12-31 NOTE — Discharge Summary (Signed)
Physician Discharge Summary  Prince Couey ZOX:[id number] DOB: [birth year] DOA: 2019/01/02  PCP: Patient, No Pcp Per  Admit date: 01/02/19 Discharge date: 12/31/2018  Admitted From: Inpatient Disposition: SNF  Recommendations for Outpatient Follow-up:  1. Follow-up per receiving facility MD 2. Please obtain BMP/CBC in one week:  Home Health:No Equipment/Devices:none  Discharge Condition:Stable CODE STATUS:DNR Diet recommendation: Diabetic diet  Brief/Interim Summary: Jonty Peterkinis a 73 y.o.BM PMHx hyperlipidemia, diabetes mellitus type II uncontrolled with complication, COPDon3 L O2 at home, atrial fibrillation on Eliquis,depression,    Presents with altered mental status and shortness of breath.  Pt has AMS and isunable to provide any medical history, therefore, most of the history is obtained by discussing the case with ED physician, per EMS report, and with the nursing staff.  Per report, ptdeveloped shortness of breathin the facility,found to have oxygen desaturation to 50%. Pt wasgiven breathing treatment and also treated with a dose of Ativan, then patient became minimally responsive.When I saw pt in ED, he is barelyarousable, seems to move extremities upon painful stimuli. Patient has respiratory distress, but not actively coughing. No active nausea,vomiting or diarrhea noted. Not sure if patient has any pain anywhere.  ED Course:pt was found to have negative COVID19 test, negative Flu PCR,WBC 12.3, lactic acid of 4.9, troponin 0 0.03, urinalysis (cloudy appearance, rare bacteria, WBC 11-20), potassium 5.2, creatinine 0.89, BUN 18, abnormal liver function (ALP 61, AST 137, ALT 110, total bilirubin 0.8), ammonia level 27, temperature normal, atrial fibrillation with RVR, tachypnea, oxygen saturation 100% on 5 L nasal cannula oxygen,ABG withpH 7.182,pCO2103, PO2 142. Chest x-ray showed left upper lobe infiltration. CT head negative for acute  intracranial abnormalities. Patient is admitted to stepdown as inpatient.  Hospital course: Sepsis with healthcare associated pneumonia.  As noted chest x-ray showed left upper lobe infiltration.  Patient was septic on admission and was noted to be COVID T-.  Urine strep pneumo and Legionella were negative.  Patient will complete 7 days of antibiotics 2 additional days at skilled nursing facility.  Resume his prednisone 20 mg p.o. daily which he was taken prior to admission per med rec.  Continue supportive care discharge to include nebs as needed.  COPD exacerbation treatment as noted above patient is not in exacerbation stable.  Respiratory failure with hypoxia and hypercapnia.  Also resolved with treatment as above.  Patient is on 3 L home O2 at baseline which she will continue.  Acute metabolic encephalopathy.  This is multifactorial secondary to infection with sepsis and hypoxia.  Again this is resolved with treatment as above.  A. fib with RVR.  Patient was restarted on his Eliquis.  Continue his home medications.  Elevated troponins this was consistent with a type II injury second [place] to demand ischemia.  He was asymptomatic on exam he has been stable no further inpatient work-up.  Diabetes type 2 uncontrolled with complications to include hyperglycemia.  Patient's hemoglobin A1c was noted to be 7.6.  He will continue his Lantus as an outpatient.  Will defer adjusting treatment regimen to receiving facility.  He was on sliding scale insulin while in the hospital.  Lactic acidosis resolved  Depression/anxiety.  Was continued on Zoloft he has been off his Ativan while in the hospital which can continue to hold patient's been stable without any evidence of anxiety I concern for paradoxical reaction in elderly male and I discontinued it    Discharge Diagnoses:  Principal Problem:   HCAP (healthcare-associated pneumonia) Active Problems:   HLD (hyperlipidemia)  Diabetes mellitus  without complication (HCC)   Depression   COPD (chronic obstructive pulmonary disease) (HCC)   Atrial fibrillation with RVR (HCC)   Acute on chronic respiratory failure with hypoxia (HCC)   Acute metabolic encephalopathy   Sepsis (HCC)   Hyperkalemia   Abnormal LFTs   Elevated troponin   COPD exacerbation (HCC)   Sepsis due to pneumonia (HCC)   Diabetes mellitus type 2, uncontrolled, with complications (HCC)   Anxiety    Discharge Instructions  Discharge Instructions    Diet Carb Modified   Complete by:  As directed    Discharge instructions   Complete by:  As directed    Receiving facility to return to ER for any acute change in medical condition   Increase activity slowly   Complete by:  As directed      Allergies as of 12/31/2018      Reactions   Lisinopril Swelling   Angioedema   Sulfamethoxazole-trimethoprim Anaphylaxis, Other (See Comments)   Other reaction(s): Muscle Pain   Ticagrelor Shortness Of Breath      Trimethoprim Other (See Comments)   Per [facility]      Medication List    STOP taking these medications   doxycycline 100 MG tablet Commonly known as:  VIBRA-TABS   LORazepam 0.5 MG tablet Commonly known as:  ATIVAN     TAKE these medications   Acidophilus Probiotic 100 MG Caps Take 100 mg by mouth 2 (two) times a day.   aspirin 81 MG EC tablet Take 1 tablet (81 mg total) by mouth daily for 30 days. Start taking on:  Jan 01, 2019   atorvastatin 40 MG tablet Commonly known as:  LIPITOR Take 40 mg by mouth at bedtime.   azithromycin 500 MG tablet Commonly known as:  ZITHROMAX Take 1 tablet (500 mg total) by mouth daily.   Basaglar KwikPen 100 UNIT/ML Sopn Inject 10-30 Units into the skin See admin instructions. Inject 30 units subcutaneously daily at 9am, inject 10 units daily at 6pm   cefdinir 300 MG capsule Commonly known as:  OMNICEF Take 1 capsule (300 mg total) by mouth 2 (two) times daily for 2 days.   Eliquis 5 MG Tabs tablet Generic  drug:  apixaban Take 5 mg by mouth 2 (two) times daily.   guaiFENesin 600 MG 12 hr tablet Commonly known as:  MUCINEX Take 1,200 mg by mouth 2 (two) times daily.   ipratropium-albuterol 0.5-2.5 (3) MG/3ML Soln Commonly known as:  DUONEB Take 3 mLs by nebulization every 6 (six) hours.   multivitamin with minerals Tabs tablet Take 1 tablet by mouth daily.   OXYGEN Inhale 3 L into the lungs continuous.   pantoprazole 40 MG tablet Commonly known as:  PROTONIX Take 40 mg by mouth daily at 6 (six) AM.   predniSONE 20 MG tablet Commonly known as:  DELTASONE Take 20 mg by mouth daily.   ProMod Liqd Take 30 mLs by mouth 2 (two) times a day.   sertraline 50 MG tablet Commonly known as:  ZOLOFT Take 50 mg by mouth daily.   vitamin C 500 MG tablet Commonly known as:  ASCORBIC ACID Take 500 mg by mouth daily.       Allergies  Allergen Reactions  . Lisinopril Swelling    Angioedema   . Sulfamethoxazole-Trimethoprim Anaphylaxis and Other (See Comments)    Other reaction(s): Muscle Pain   . Ticagrelor Shortness Of Breath       . Trimethoprim Other (See Comments)  Per [facility]    Consultations:  None   Procedures/Studies: Ct Head Wo Contrast  Result Date: 12/26/2018 CLINICAL DATA:  73 year old male with ataxia, encephalopathy, atrial fibrillation. EXAM: CT HEAD WITHOUT CONTRAST TECHNIQUE: Contiguous axial images were obtained from the base of the skull through the vertex without intravenous contrast. COMPARISON:  None. FINDINGS: Brain: Cerebral volume is within normal limits for age. No midline shift, ventriculomegaly, mass effect, evidence of mass lesion, intracranial hemorrhage or evidence of cortically based acute infarction. Patchy periatrial white matter hypodensity. No cortical encephalomalacia. Vascular: Calcified atherosclerosis at the skull base. No suspicious intracranial vascular hyperdensity. Skull: Negative. Sinuses/Orbits: Visualized paranasal sinuses and  mastoids are well pneumatized. Other: Negative orbits. Visualized scalp soft tissues are within normal limits. IMPRESSION: 1. No acute intracranial abnormality. 2. Mild for age nonspecific cerebral white matter changes, most commonly due to small vessel disease. Electronically Signed   By: Odessa Fleming M.D.   On: 12/26/2018 01:49   Dg Chest Port 1 View  Result Date: 12/22/2018 CLINICAL DATA:  73 year old male with sepsis. EXAM: PORTABLE CHEST 1 VIEW COMPARISON:  11/26/2018 and earlier. FINDINGS: Portable AP supine view at 2057 hours. Stable large lung volumes. Stable cardiac size and mediastinal contours. Visualized tracheal air column is within normal limits. No pneumothorax, pleural effusion or consolidation. New asymmetric reticulonodular opacity in the left upper lung. No acute osseous abnormality identified. IMPRESSION: New left upper lobe reticulonodular pulmonary opacity suspicious for acute viral/atypical respiratory infection. Underlying pulmonary hyperinflation. Electronically Signed   By: Odessa Fleming M.D.   On: 12/26/2018 22:02       Subjective: Patient resting comfortably in bed eating breakfast this morning.  No acute complaints appears to be at baseline.  Discharge Exam: Vitals:   12/31/18 0754 12/31/18 0757  BP:    Pulse:  83  Resp:  (!) 23  Temp:    SpO2: 100% 100%   Vitals:   12/31/18 0444 12/31/18 0746 12/31/18 0754 12/31/18 0757  BP:  118/61    Pulse:  73  83  Resp:  20  (!) 23  Temp:      TempSrc:      SpO2:   100% 100%  Weight: 68.6 kg       General: Pt is alert, awake, not in acute distress Cardiovascular: RRR, S1/S2 +, no rubs, no gallops Respiratory: CTA bilaterally, no wheezing, trace rhonchi Abdominal: Soft, NT, ND, bowel sounds + Extremities: no edema, no cyanosis    The results of significant diagnostics from this hospitalization (including imaging, microbiology, ancillary and laboratory) are listed below for reference.     Microbiology: Recent Results  (from the past 240 hour(s))  SARS Coronavirus 2 [facility] order, Performed in [facility] hospital lab)     Status: None   Collection Time: 12/16/2018  8:45 PM  Result Value Ref Range Status   SARS Coronavirus 2 NEGATIVE NEGATIVE Final    Comment: (NOTE) If result is NEGATIVE SARS-CoV-2 target nucleic acids are NOT DETECTED. The SARS-CoV-2 RNA is generally detectable in upper and lower  respiratory specimens during the acute phase of infection. The lowest  concentration of SARS-CoV-2 viral copies this assay can detect is 250  copies / mL. A negative result does not preclude SARS-CoV-2 infection  and should not be used as the sole basis for treatment or other  patient management decisions.  A negative result may occur with  improper specimen collection / handling, submission of specimen other  than nasopharyngeal swab, presence of viral mutation(s) within  the  areas targeted by this assay, and inadequate number of viral copies  (<250 copies / mL). A negative result must be combined with clinical  observations, patient history, and epidemiological information. If result is POSITIVE SARS-CoV-2 target nucleic acids are DETECTED. The SARS-CoV-2 RNA is generally detectable in upper and lower  respiratory specimens dur ing the acute phase of infection.  Positive  results are indicative of active infection with SARS-CoV-2.  Clinical  correlation with patient history and other diagnostic information is  necessary to determine patient infection status.  Positive results do  not rule out bacterial infection or co-infection with other viruses. If result is PRESUMPTIVE POSTIVE SARS-CoV-2 nucleic acids MAY BE PRESENT.   A presumptive positive result was obtained on the submitted specimen  and confirmed on repeat testing.  While 2019 novel coronavirus  (SARS-CoV-2) nucleic acids may be present in the submitted sample  additional confirmatory testing may be necessary for epidemiological  and / or  clinical management purposes  to differentiate between  SARS-CoV-2 and other Sarbecovirus currently known to infect humans.  If clinically indicated additional testing with an alternate test  methodology [phone]) is advised. The SARS-CoV-2 RNA is generally  detectable in upper and lower respiratory sp ecimens during the acute  phase of infection. The expected result is Negative. Fact Sheet for Patients:  BoilerBrush.com.cy Fact Sheet for Healthcare Providers: https://pope.com/ This test is not yet approved or cleared by the [place] FDA and has been authorized for detection and/or diagnosis of SARS-CoV-2 by FDA under an Emergency Use Authorization (EUA).  This EUA will remain in effect (meaning this test can be used) for the duration of the COVID-19 declaration under Section 564(b)(1) of the Act, 21 U.S.C. section 360bbb-3(b)(1), unless the authorization is terminated or revoked sooner. Performed at [facility], [address], [place], [place] [address]   Blood Culture (routine x 2)     Status: Abnormal   Collection Time: 2019/01/07  8:55 PM  Result Value Ref Range Status   Specimen Description BLOOD LEFT ARM  Final   Special Requests   Final    BOTTLES DRAWN AEROBIC AND ANAEROBIC Blood Culture results may not be optimal due to an inadequate volume of blood received in culture bottles   Culture  Setup Time   Final    GRAM POSITIVE COCCI AEROBIC BOTTLE ONLY CRITICAL RESULT CALLED TO, READ BACK BY AND VERIFIED WITH: J ORIET PHARMD 12/28/18 0013 JDW    Culture (A)  Final    STAPHYLOCOCCUS SPECIES (COAGULASE NEGATIVE) THE SIGNIFICANCE OF ISOLATING THIS ORGANISM FROM A SINGLE SET OF BLOOD CULTURES WHEN MULTIPLE SETS ARE DRAWN IS UNCERTAIN. PLEASE NOTIFY THE MICROBIOLOGY DEPARTMENT WITHIN ONE WEEK IF SPECIATION AND SENSITIVITIES ARE REQUIRED. Performed at [facility], [address] [place], [place] [address]    Report  Status 12/29/2018 FINAL  Final  Blood Culture ID Panel (Reflexed)     Status: Abnormal   Collection Time: 07-Jan-2019  8:55 PM  Result Value Ref Range Status   Enterococcus species NOT DETECTED NOT DETECTED Final   Listeria monocytogenes NOT DETECTED NOT DETECTED Final   Staphylococcus species DETECTED (A) NOT DETECTED Final    Comment: Methicillin (oxacillin) resistant coagulase negative staphylococcus. Possible blood culture contaminant (unless isolated from more than one blood culture draw or clinical case suggests pathogenicity). No antibiotic treatment is indicated for blood  culture contaminants.    Staphylococcus aureus (BCID) NOT DETECTED NOT DETECTED Final   Methicillin resistance DETECTED (  A) NOT DETECTED Final    Comment: CRITICAL RESULT CALLED TO, READ BACK BY AND VERIFIED WITH: J ORIET PHARMD 12/28/18 0013 JDW    Streptococcus species NOT DETECTED NOT DETECTED Final   Streptococcus agalactiae NOT DETECTED NOT DETECTED Final   Streptococcus pneumoniae NOT DETECTED NOT DETECTED Final   Streptococcus pyogenes NOT DETECTED NOT DETECTED Final   Acinetobacter baumannii NOT DETECTED NOT DETECTED Final   Enterobacteriaceae species NOT DETECTED NOT DETECTED Final   Enterobacter cloacae complex NOT DETECTED NOT DETECTED Final   Escherichia coli NOT DETECTED NOT DETECTED Final   Klebsiella oxytoca NOT DETECTED NOT DETECTED Final   Klebsiella pneumoniae NOT DETECTED NOT DETECTED Final   Proteus species NOT DETECTED NOT DETECTED Final   Serratia marcescens NOT DETECTED NOT DETECTED Final   Haemophilus influenzae NOT DETECTED NOT DETECTED Final   Neisseria meningitidis NOT DETECTED NOT DETECTED Final   Pseudomonas aeruginosa NOT DETECTED NOT DETECTED Final   Candida albicans NOT DETECTED NOT DETECTED Final   Candida glabrata NOT DETECTED NOT DETECTED Final   Candida krusei NOT DETECTED NOT DETECTED Final   Candida parapsilosis NOT DETECTED NOT DETECTED Final   Candida tropicalis NOT  DETECTED NOT DETECTED Final    Comment: Performed at [facility], [address], [place], [place] [address]  Blood Culture (routine x 2)     Status: None   Collection Time: Jan 18, 2019  9:00 PM  Result Value Ref Range Status   Specimen Description BLOOD RIGHT ARM  Final   Special Requests   Final    BOTTLES DRAWN AEROBIC AND ANAEROBIC Blood Culture results may not be optimal due to an excessive volume of blood received in culture bottles   Culture   Final    NO GROWTH 5 DAYS Performed at [facility], [address], [place], [place] [address]    Report Status 12/30/2018 FINAL  Final  Respiratory Panel by PCR     Status: Abnormal   Collection Time: 12/26/18  1:12 AM  Result Value Ref Range Status   Adenovirus NOT DETECTED NOT DETECTED Final   Coronavirus 229E NOT DETECTED NOT DETECTED Final    Comment: (NOTE) The Coronavirus on the Respiratory Panel, DOES NOT test for the novel  Coronavirus (2019 nCoV)    Coronavirus HKU1 DETECTED (A) NOT DETECTED Final   Coronavirus NL63 NOT DETECTED NOT DETECTED Final   Coronavirus OC43 NOT DETECTED NOT DETECTED Final   Metapneumovirus NOT DETECTED NOT DETECTED Final   Rhinovirus / Enterovirus NOT DETECTED NOT DETECTED Final   Influenza A NOT DETECTED NOT DETECTED Final   Influenza B NOT DETECTED NOT DETECTED Final   Parainfluenza Virus 1 NOT DETECTED NOT DETECTED Final   Parainfluenza Virus 2 NOT DETECTED NOT DETECTED Final   Parainfluenza Virus 3 NOT DETECTED NOT DETECTED Final   Parainfluenza Virus 4 NOT DETECTED NOT DETECTED Final   Respiratory Syncytial Virus NOT DETECTED NOT DETECTED Final   Bordetella pertussis NOT DETECTED NOT DETECTED Final   Chlamydophila pneumoniae NOT DETECTED NOT DETECTED Final   Mycoplasma pneumoniae NOT DETECTED NOT DETECTED Final    Comment: Performed at [facility], [address], [place], [place] [address]  Urine culture     Status: Abnormal   Collection Time: 12/26/18  2:59 AM  Result  Value Ref Range Status   Specimen Description URINE, CLEAN CATCH  Final   Special Requests NONE  Final   Culture (A)  Final    <10,000 COLONIES/mL INSIGNIFICANT  GROWTH Performed at [facility], [address] [place], [place] [address]    Report Status 12/26/2018 FINAL  Final  MRSA PCR Screening     Status: None   Collection Time: 12/26/18  4:45 PM  Result Value Ref Range Status   MRSA by PCR NEGATIVE NEGATIVE Final    Comment:        The GeneXpert MRSA Assay (FDA approved for NASAL specimens only), is one component of a comprehensive MRSA colonization surveillance program. It is not intended to diagnose MRSA infection nor to guide or monitor treatment for MRSA infections. Performed at [facility], [address] [place], [place] [address]   Culture, blood (routine x 2)     Status: None (Preliminary result)   Collection Time: 12/30/18  9:30 AM  Result Value Ref Range Status   Specimen Description BLOOD RIGHT THUMB  Final   Special Requests   Final    BOTTLES DRAWN AEROBIC ONLY Blood Culture results may not be optimal due to an inadequate volume of blood received in culture bottles   Culture   Final    NO GROWTH < 24 HOURS Performed at [facility], [address] [place], [place] [address]    Report Status PENDING  Incomplete  Culture, blood (routine x 2)     Status: None (Preliminary result)   Collection Time: 12/30/18  4:32 PM  Result Value Ref Range Status   Specimen Description BLOOD SITE NOT SPECIFIED  Final   Special Requests AEROBIC BOTTLE ONLY Blood Culture adequate volume  Final   Culture   Final    NO GROWTH < 24 HOURS Performed at [facility], [address], [place], [place] [address]    Report Status PENDING  Incomplete     Labs: BNP (last 3 results) Recent Labs    12/26/18 0831  BNP 96.7   Basic Metabolic Panel: Recent Labs  Lab 12/26/18 2030 12/27/18 0300 12/28/18 0345 12/29/18 0312 12/30/18 0228 12/31/18 0403   NA 140  --  139 140 142 142  K 4.9  --  4.3 4.9 4.8 4.4  CL 92*  --  95* 94* 95* 93*  CO2 39*  --  38* 38* 42* 40*  GLUCOSE 167*  --  147* 294* 117* 123*  BUN 17  --  15 26* 19 18  CREATININE 0.75  --  0.54* 0.73 0.54* 0.49*  CALCIUM 9.4  --  9.2 9.4 9.3 9.3  MG  --  1.9 1.7 2.1 1.9 1.8   Liver Function Tests: Recent Labs  Lab 11/30/2018 2102 12/26/18 0831 12/26/18 2030  AST 137* 51* 37  ALT 110* 83* 72*  ALKPHOS 61 49 54  BILITOT 0.8 0.5 0.4  PROT 6.6 5.6* 5.9*  ALBUMIN 2.5* 2.1* 2.3*   No results for input(s): LIPASE, AMYLASE in the last 168 hours. Recent Labs  Lab 12/26/18 0042  AMMONIA 27   CBC: Recent Labs  Lab 12/18/2018 2102 12/26/18 0831 12/27/18 0300 12/28/18 0345 12/29/18 1008 12/30/18 0228 12/31/18 0403  WBC 12.3* 8.6 9.4 6.8 13.1* 9.4 8.7  NEUTROABS 10.8* 7.6  --   --   --   --   --   HGB 9.6* 8.1* 7.8* 8.0* 9.2* 8.8* 8.2*  HCT 34.1* 28.1* 26.5* 27.9* 30.1* 31.0* 28.6*  MCV 101.5* 99.6 97.4 98.6 99.0 100.3* 101.4*  PLT 250 194 190 185 210 180 176   Cardiac Enzymes: Recent Labs  Lab 12/26/18 0645  12/26/18 1124 12/26/18 1438 12/26/18 2030 12/27/18 0300  TROPONINI 0.31* 0.36* 0.31* 0.28* 0.20*   BNP: Invalid input(s): POCBNP CBG: Recent Labs  Lab 12/30/18 1940 12/31/18 0021 12/31/18 0436 12/31/18 0744 12/31/18 0817  GLUCAP 178* 192* 122* 60* 75   D-Dimer No results for input(s): DDIMER in the last 72 hours. Hgb A1c No results for input(s): HGBA1C in the last 72 hours. Lipid Profile No results for input(s): CHOL, HDL, LDLCALC, TRIG, CHOLHDL, LDLDIRECT in the last 72 hours. Thyroid function studies No results for input(s): TSH, T4TOTAL, T3FREE, THYROIDAB in the last 72 hours.  Invalid input(s): FREET3 Anemia work up No results for input(s): VITAMINB12, FOLATE, FERRITIN, TIBC, IRON, RETICCTPCT in the last 72 hours. Urinalysis    Component Value Date/Time   COLORURINE AMBER (A) 12/21/2018 2036   APPEARANCEUR CLOUDY (A) 12/24/2018  2036   LABSPEC 1.020 12/12/2018 2036   PHURINE 5.0 12/19/2018 2036   GLUCOSEU >=500 (A) 12/04/2018 2036   HGBUR NEGATIVE 12/19/2018 2036   BILIRUBINUR NEGATIVE 11/30/2018 2036   KETONESUR NEGATIVE 12/24/2018 2036   PROTEINUR 100 (A) 12/13/2018 2036   NITRITE NEGATIVE 12/20/2018 2036   LEUKOCYTESUR NEGATIVE 12/08/2018 2036   Sepsis Labs Invalid input(s): PROCALCITONIN,  WBC,  LACTICIDVEN Microbiology Recent Results (from the past 240 hour(s))  SARS Coronavirus 2 [facility] order, Performed in [facility] hospital lab)     Status: None   Collection Time: 12/10/2018  8:45 PM  Result Value Ref Range Status   SARS Coronavirus 2 NEGATIVE NEGATIVE Final    Comment: (NOTE) If result is NEGATIVE SARS-CoV-2 target nucleic acids are NOT DETECTED. The SARS-CoV-2 RNA is generally detectable in upper and lower  respiratory specimens during the acute phase of infection. The lowest  concentration of SARS-CoV-2 viral copies this assay can detect is 250  copies / mL. A negative result does not preclude SARS-CoV-2 infection  and should not be used as the sole basis for treatment or other  patient management decisions.  A negative result may occur with  improper specimen collection / handling, submission of specimen other  than nasopharyngeal swab, presence of viral mutation(s) within the  areas targeted by this assay, and inadequate number of viral copies  (<250 copies / mL). A negative result must be combined with clinical  observations, patient history, and epidemiological information. If result is POSITIVE SARS-CoV-2 target nucleic acids are DETECTED. The SARS-CoV-2 RNA is generally detectable in upper and lower  respiratory specimens dur ing the acute phase of infection.  Positive  results are indicative of active infection with SARS-CoV-2.  Clinical  correlation with patient history and other diagnostic information is  necessary to determine patient infection status.  Positive results do  not  rule out bacterial infection or co-infection with other viruses. If result is PRESUMPTIVE POSTIVE SARS-CoV-2 nucleic acids MAY BE PRESENT.   A presumptive positive result was obtained on the submitted specimen  and confirmed on repeat testing.  While 2019 novel coronavirus  (SARS-CoV-2) nucleic acids may be present in the submitted sample  additional confirmatory testing may be necessary for epidemiological  and / or clinical management purposes  to differentiate between  SARS-CoV-2 and other Sarbecovirus currently known to infect humans.  If clinically indicated additional testing with an alternate test  methodology [phone]) is advised. The SARS-CoV-2 RNA is generally  detectable in upper and lower respiratory sp ecimens during the acute  phase of infection. The expected result is Negative. Fact Sheet for Patients:  BoilerBrush.com.cy Fact Sheet for Healthcare  Providers: https://pope.com/https://www.fda.gov/media/136313/download This test is not yet approved or cleared by the [place] FDA and has been authorized for detection and/or diagnosis of SARS-CoV-2 by FDA under an Emergency Use Authorization (EUA).  This EUA will remain in effect (meaning this test can be used) for the duration of the COVID-19 declaration under Section 564(b)(1) of the Act, 21 U.S.C. section 360bbb-3(b)(1), unless the authorization is terminated or revoked sooner. Performed at [facility], [address] [place], [place] [address]   Blood Culture (routine x 2)     Status: Abnormal   Collection Time: 2019-07-17  8:55 PM  Result Value Ref Range Status   Specimen Description BLOOD LEFT ARM  Final   Special Requests   Final    BOTTLES DRAWN AEROBIC AND ANAEROBIC Blood Culture results may not be optimal due to an inadequate volume of blood received in culture bottles   Culture  Setup Time   Final    GRAM POSITIVE COCCI AEROBIC BOTTLE ONLY CRITICAL RESULT CALLED TO, READ BACK BY AND VERIFIED  WITH: J ORIET PHARMD 12/28/18 0013 JDW    Culture (A)  Final    STAPHYLOCOCCUS SPECIES (COAGULASE NEGATIVE) THE SIGNIFICANCE OF ISOLATING THIS ORGANISM FROM A SINGLE SET OF BLOOD CULTURES WHEN MULTIPLE SETS ARE DRAWN IS UNCERTAIN. PLEASE NOTIFY THE MICROBIOLOGY DEPARTMENT WITHIN ONE WEEK IF SPECIATION AND SENSITIVITIES ARE REQUIRED. Performed at [facility], [address] [place], [place] [address]    Report Status 12/29/2018 FINAL  Final  Blood Culture ID Panel (Reflexed)     Status: Abnormal   Collection Time: 2019-07-17  8:55 PM  Result Value Ref Range Status   Enterococcus species NOT DETECTED NOT DETECTED Final   Listeria monocytogenes NOT DETECTED NOT DETECTED Final   Staphylococcus species DETECTED (A) NOT DETECTED Final    Comment: Methicillin (oxacillin) resistant coagulase negative staphylococcus. Possible blood culture contaminant (unless isolated from more than one blood culture draw or clinical case suggests pathogenicity). No antibiotic treatment is indicated for blood  culture contaminants.    Staphylococcus aureus (BCID) NOT DETECTED NOT DETECTED Final   Methicillin resistance DETECTED (A) NOT DETECTED Final    Comment: CRITICAL RESULT CALLED TO, READ BACK BY AND VERIFIED WITH: J ORIET PHARMD 12/28/18 0013 JDW    Streptococcus species NOT DETECTED NOT DETECTED Final   Streptococcus agalactiae NOT DETECTED NOT DETECTED Final   Streptococcus pneumoniae NOT DETECTED NOT DETECTED Final   Streptococcus pyogenes NOT DETECTED NOT DETECTED Final   Acinetobacter baumannii NOT DETECTED NOT DETECTED Final   Enterobacteriaceae species NOT DETECTED NOT DETECTED Final   Enterobacter cloacae complex NOT DETECTED NOT DETECTED Final   Escherichia coli NOT DETECTED NOT DETECTED Final   Klebsiella oxytoca NOT DETECTED NOT DETECTED Final   Klebsiella pneumoniae NOT DETECTED NOT DETECTED Final   Proteus species NOT DETECTED NOT DETECTED Final   Serratia marcescens NOT DETECTED NOT  DETECTED Final   Haemophilus influenzae NOT DETECTED NOT DETECTED Final   Neisseria meningitidis NOT DETECTED NOT DETECTED Final   Pseudomonas aeruginosa NOT DETECTED NOT DETECTED Final   Candida albicans NOT DETECTED NOT DETECTED Final   Candida glabrata NOT DETECTED NOT DETECTED Final   Candida krusei NOT DETECTED NOT DETECTED Final   Candida parapsilosis NOT DETECTED NOT DETECTED Final   Candida tropicalis NOT DETECTED NOT DETECTED Final    Comment: Performed at [facility], [address] [place], [place] [address]  Blood Culture (routine x 2)     Status:  None   Collection Time: 12/15/2018  9:00 PM  Result Value Ref Range Status   Specimen Description BLOOD RIGHT ARM  Final   Special Requests   Final    BOTTLES DRAWN AEROBIC AND ANAEROBIC Blood Culture results may not be optimal due to an excessive volume of blood received in culture bottles   Culture   Final    NO GROWTH 5 DAYS Performed at [facility], [address] [place], [place] [address]    Report Status 12/30/2018 FINAL  Final  Respiratory Panel by PCR     Status: Abnormal   Collection Time: 12/26/18  1:12 AM  Result Value Ref Range Status   Adenovirus NOT DETECTED NOT DETECTED Final   Coronavirus 229E NOT DETECTED NOT DETECTED Final    Comment: (NOTE) The Coronavirus on the Respiratory Panel, DOES NOT test for the novel  Coronavirus (2019 nCoV)    Coronavirus HKU1 DETECTED (A) NOT DETECTED Final   Coronavirus NL63 NOT DETECTED NOT DETECTED Final   Coronavirus OC43 NOT DETECTED NOT DETECTED Final   Metapneumovirus NOT DETECTED NOT DETECTED Final   Rhinovirus / Enterovirus NOT DETECTED NOT DETECTED Final   Influenza A NOT DETECTED NOT DETECTED Final   Influenza B NOT DETECTED NOT DETECTED Final   Parainfluenza Virus 1 NOT DETECTED NOT DETECTED Final   Parainfluenza Virus 2 NOT DETECTED NOT DETECTED Final   Parainfluenza Virus 3 NOT DETECTED NOT DETECTED Final   Parainfluenza Virus 4 NOT DETECTED NOT  DETECTED Final   Respiratory Syncytial Virus NOT DETECTED NOT DETECTED Final   Bordetella pertussis NOT DETECTED NOT DETECTED Final   Chlamydophila pneumoniae NOT DETECTED NOT DETECTED Final   Mycoplasma pneumoniae NOT DETECTED NOT DETECTED Final    Comment: Performed at [facility], [address] [place], [place] [address]  Urine culture     Status: Abnormal   Collection Time: 12/26/18  2:59 AM  Result Value Ref Range Status   Specimen Description URINE, CLEAN CATCH  Final   Special Requests NONE  Final   Culture (A)  Final    <10,000 COLONIES/mL INSIGNIFICANT GROWTH Performed at [facility], [address] [place], [place] [address]    Report Status 12/26/2018 FINAL  Final  MRSA PCR Screening     Status: None   Collection Time: 12/26/18  4:45 PM  Result Value Ref Range Status   MRSA by PCR NEGATIVE NEGATIVE Final    Comment:        The GeneXpert MRSA Assay (FDA approved for NASAL specimens only), is one component of a comprehensive MRSA colonization surveillance program. It is not intended to diagnose MRSA infection nor to guide or monitor treatment for MRSA infections. Performed at [facility], [address] [place], [place] [address]   Culture, blood (routine x 2)     Status: None (Preliminary result)   Collection Time: 12/30/18  9:30 AM  Result Value Ref Range Status   Specimen Description BLOOD RIGHT THUMB  Final   Special Requests   Final    BOTTLES DRAWN AEROBIC ONLY Blood Culture results may not be optimal due to an inadequate volume of blood received in culture bottles   Culture   Final    NO GROWTH < 24 HOURS Performed at [facility], [address], [place], [place] [address]    Report Status PENDING  Incomplete  Culture, blood (routine x 2)     Status: None (Preliminary result)  Collection Time: 12/30/18  4:32 PM  Result Value Ref Range Status   Specimen Description BLOOD SITE NOT SPECIFIED  Final   Special Requests AEROBIC  BOTTLE ONLY Blood Culture adequate volume  Final   Culture   Final    NO GROWTH < 24 HOURS Performed at [facility], [address] [place], [place] [address]    Report Status PENDING  Incomplete     Time coordinating discharge: 35 minutes  SIGNED:   Burke Keels, MD  [facility] 12/31/2018, 10:41 AM Pager   If 7PM-7AM, please contact night-coverage [email or web address] Password [email or web address]

## 2018-12-31 NOTE — TOC Transition Note (Addendum)
Transition of Care [facility]) - CM/SW Discharge Note   Patient Details  Name: Gregory Stone MRN: [id number] Date of Birth: 01/23/1946  [facility]) CM/SW Contact:  Gildardo Griffes, LCSW Phone Number: 12/31/2018, 1:12 PM   Clinical Narrative:     Patient will DC to: [facility] Anticipated DC date: 12/31/2018 Family notified:Faye   Transport TC:YELY  Per MD patient ready for DC to [facility] . RN, patient, patient's family, and facility notified of DC. Discharge Summary sent to facility. RN given number for report [phone] Room 107 . DC packet on chart. Ambulance transport requested for patient for 3:00 pm.   CSW signing off.  [place], [place] [phone]   Final next level of care: Skilled Nursing Facility Barriers to Discharge: No Barriers Identified   Patient Goals and CMS Choice Patient states their goals for this hospitalization and ongoing recovery are:: to return to [facility] CMS Medicare.gov Compare Post Acute Care list provided to:: Patient Choice offered to / list presented to : Patient  Discharge Placement PASRR number recieved: 12/29/18            Patient chooses bed at: Other - please specify in the comment section below:([facility]) Patient to be transferred to facility by: [facility] Name of family member notified: Lucendia Herrlich Patient and family notified of of transfer: 12/31/18  Discharge Plan and Services In-house Referral: Clinical Social Work Discharge Planning Services: NA Post Acute Care Choice: Skilled Nursing Facility          DME Arranged: N/A DME Agency: NA       HH Arranged: NA HH Agency: NA        Social Determinants of Health (SDOH) Interventions     Readmission Risk Interventions Readmission Risk Prevention Plan 12/29/2018  Transportation Screening Complete  PCP or Specialist Appt within 5-7 Days Complete  Home Care Screening Complete  Medication Review (RN CM) Complete

## 2018-12-31 NOTE — Progress Notes (Signed)
Increased WOB/Respiratory rate with sats in low to mid 80's on 4 L. C/O difficulty breathing d/t nasal congestion. Lungs diminished, otherwise clear. HFNC O2 @ 8L applied with salter per RT. Dr Lurene Shadow made aware. PCXR ordered. CSW holding transport to SNF for now.

## 2018-12-31 NOTE — Progress Notes (Signed)
Pharmacy Antibiotic Note  Gregory Stone is a 73 y.o. male admitted on 11/30/2018 with sepsis/PNA.  Pharmacy has been consulted for Zosyn dosing.  ID: abx #7 for PNA. WBC= WNL, afeb, PCT= 0.17, LA= 2.4. Increased WOB, RR, and O2 requirement.  4/28 CTX>>5/3 4/27 Vanc >> 4/28 4/27 Zosyn >> 4/28. 5/3>> 4/27 Azith>>5/3  5/2 BC x 2>> 4/28 Covid negative Positive Coronavirus H KU 1 4/27 BCx: 1/4 coag neg staph 4/27 UCx:  insignif 4/28 MRSA PCR- neg 4/28- corona virus (not covid)  Plan: D/c Rocephin/Aithro Resume Zosyn 3.375g IV q 8 hrsl  Weight: 151 lb 3.8 oz (68.6 kg)  Temp (24hrs), Avg:97.9 F (36.6 C), Min:97.7 F (36.5 C), Max:97.9 F (36.6 C)  Recent Labs  Lab 12/09/2018 2349 12/26/18 0042 12/26/18 0320  12/26/18 1438 12/26/18 2030 12/27/18 0300 12/28/18 0345 12/29/18 0312 12/29/18 1008 12/30/18 0228 12/31/18 0403  WBC  --   --   --    < >  --   --  9.4 6.8  --  13.1* 9.4 8.7  CREATININE  --   --   --    < >  --  0.75  --  0.54* 0.73  --  0.54* 0.49*  LATICACIDVEN 0.9 1.2 3.4*  --  2.4* 0.9  --   --   --   --   --   --    < > = values in this interval not displayed.    CrCl cannot be calculated (Unknown ideal weight.).    Allergies  Allergen Reactions  . Lisinopril Swelling    Angioedema   . Sulfamethoxazole-Trimethoprim Anaphylaxis and Other (See Comments)    Other reaction(s): Muscle Pain   . Ticagrelor Shortness Of Breath       . Trimethoprim Other (See Comments)    Per MAR    Karyl Sharrar S. Merilynn Finland, PharmD, BCPS Clinical Staff Pharmacist Misty Stanley Stillinger 12/31/2018 3:28 PM

## 2018-12-31 NOTE — Progress Notes (Signed)
Pt IV infiltrated during the day,  IV team attempted to restart x 2 without success. Unable to administer ZOSYN and Solu-medrol per order. On call Bodenheimer NP, made aware; no new orders. Will continue to monitor pt.

## 2019-01-01 ENCOUNTER — Inpatient Hospital Stay: Payer: Self-pay

## 2019-01-01 LAB — GLUCOSE, CAPILLARY
Glucose-Capillary: 149 mg/dL — ABNORMAL HIGH (ref 70–99)
Glucose-Capillary: 185 mg/dL — ABNORMAL HIGH (ref 70–99)
Glucose-Capillary: 198 mg/dL — ABNORMAL HIGH (ref 70–99)
Glucose-Capillary: 255 mg/dL — ABNORMAL HIGH (ref 70–99)
Glucose-Capillary: 41 mg/dL — CL (ref 70–99)
Glucose-Capillary: 78 mg/dL (ref 70–99)
Glucose-Capillary: 80 mg/dL (ref 70–99)
Glucose-Capillary: 83 mg/dL (ref 70–99)

## 2019-01-01 LAB — BASIC METABOLIC PANEL
Anion gap: 5 (ref 5–15)
BUN: 18 mg/dL (ref 8–23)
CO2: 44 mmol/L — ABNORMAL HIGH (ref 22–32)
Calcium: 9.2 mg/dL (ref 8.9–10.3)
Chloride: 95 mmol/L — ABNORMAL LOW (ref 98–111)
Creatinine, Ser: 0.47 mg/dL — ABNORMAL LOW (ref 0.61–1.24)
GFR calc Af Amer: >60 mL/min (ref >60)
GFR calc non Af Amer: >60 mL/min (ref >60)
Glucose, Bld: 72 mg/dL (ref 70–99)
Potassium: 4.1 mmol/L (ref 3.5–5.1)
Sodium: 144 mmol/L (ref 135–145)

## 2019-01-01 LAB — CBC
HCT: 30.2 % — ABNORMAL LOW (ref 39.0–52.0)
Hemoglobin: 8.5 g/dL — ABNORMAL LOW (ref 13.0–17.0)
MCH: 28.4 pg (ref 26.0–34.0)
MCHC: 28.1 g/dL — ABNORMAL LOW (ref 30.0–36.0)
MCV: 101 fL — ABNORMAL HIGH (ref 80.0–100.0)
Platelets: 185 10*3/uL (ref 150–400)
RBC: 2.99 MIL/uL — ABNORMAL LOW (ref 4.22–5.81)
RDW: 15.3 % (ref 11.5–15.5)
WBC: 7.9 10*3/uL (ref 4.0–10.5)
nRBC: 0 % (ref 0.0–0.2)

## 2019-01-01 LAB — MAGNESIUM: Magnesium: 1.8 mg/dL (ref 1.7–2.4)

## 2019-01-01 MED ORDER — IPRATROPIUM-ALBUTEROL 0.5-2.5 (3) MG/3ML IN SOLN
3.0000 mL | Freq: Four times a day (QID) | RESPIRATORY_TRACT | Status: DC | PRN
  Administered 2019-01-01 – 2019-01-09 (×2): 3 mL via RESPIRATORY_TRACT
  Filled 2019-01-01 (×2): qty 3

## 2019-01-01 MED ORDER — INSULIN GLARGINE 100 UNIT/ML ~~LOC~~ SOLN
10.0000 [IU] | Freq: Every day | SUBCUTANEOUS | Status: DC
  Administered 2019-01-01 – 2019-01-02 (×2): 10 [IU] via SUBCUTANEOUS
  Filled 2019-01-01 (×4): qty 0.1

## 2019-01-01 MED ORDER — SODIUM CHLORIDE 0.9% FLUSH: 10.0000 mL | INTRAVENOUS | Status: DC | PRN

## 2019-01-01 MED ORDER — SODIUM CHLORIDE 0.9% FLUSH
10.0000 mL | Freq: Two times a day (BID) | INTRAVENOUS | Status: DC
  Administered 2019-01-02 – 2019-01-05 (×5): 10 mL
  Administered 2019-01-05: 20 mL
  Administered 2019-01-06 – 2019-01-12 (×11): 10 mL

## 2019-01-01 MED ORDER — SALINE SPRAY 0.65 % NA SOLN
1.0000 | NASAL | Status: DC | PRN
  Administered 2019-01-02 – 2019-01-09 (×2): 1 via NASAL
  Filled 2019-01-01: qty 44

## 2019-01-01 MED ORDER — INSULIN GLARGINE 100 UNIT/ML ~~LOC~~ SOLN
10.0000 [IU] | SUBCUTANEOUS | Status: DC
  Filled 2019-01-01: qty 0.1

## 2019-01-01 NOTE — Progress Notes (Signed)
Patient placed back on BiPAP, Fio2 30%.

## 2019-01-01 NOTE — Progress Notes (Signed)
Patient declined to wear Bipap tonight.  Resting comfortably on 4 lpm [place], maintaining sats in the upper 90's.  RT will continue to assess.

## 2019-01-01 NOTE — Progress Notes (Signed)
Patient oxygen saturation dropping to 80s.  HFNC increased to 8L. sats increased to 88%. RT notified, patient requesting to be placed back on BiPAP.  Dr. Altha Harm notified.

## 2019-01-01 NOTE — Progress Notes (Signed)
Nutrition Follow-up  INTERVENTION:   -Continue Ensure MAX Protein po BID, each supplement provides 150 kcal and 30 grams of protein -Continue Prostat liquid protein PO 30 ml BID with meals, each supplement provides 100 kcal, 15 grams protein.  NUTRITION DIAGNOSIS:   Increased nutrient needs related to wound healing as evidenced by estimated needs.  Ongoing.  GOAL:   Patient will meet greater than or equal to 90% of their needs  Progressing.  MONITOR:   PO intake, Supplement acceptance, Labs, Weight trends, I & O's  ASSESSMENT:   73 y.o. male with medical history significant of hyperlipidemia, diabetes mellitus, COPD, atrial fibrillation on Eliquis, depression, who presents with altered mental status and shortness of breath. Admitted for Sepsis and acute on chronic respiratory failure with hypoxia and hypercapnia due to HCAP and COPD exacerbation.   **RD working remotely**  Per chart review, pt now back on bi-pap following worsening respiratory status. Pt with poor appetite and has had hypoglycemic events. Pt has been consuming 70-80% of meals on 5/3. Pt is drinking supplements. Suspect respiratory status is affecting intakes today. Will continue supplements as ordered for wound healing.  Weight has +15 lb since admission 4/28.  Medications reviewed. Labs reviewed: CBGs: 41-83  Diet Order:   Diet Order            Diet Carb Modified        Diet Heart Room service appropriate? No; Fluid consistency: Thin  Diet effective now              EDUCATION NEEDS:   No education needs have been identified at this time  Skin:  Skin Assessment: Skin Integrity Issues: Skin Integrity Issues:: Stage III Stage III: sacrum  Last BM:  5/3  Height:   Ht Readings from Last 1 Encounters:  No data found for Ht  Per Care Everywhere pt was 5'8" on 3/8.  Weight:   Wt Readings from Last 1 Encounters:  12/31/18 68.6 kg    Ideal Body Weight:  70 kg(based on height of 5'8" from  3/8)  BMI:  23 kg/m^2  Estimated Nutritional Needs:   Kcal:  1900-2100  Protein:  90-100g  Fluid:  1.9L/day    Tilda Franco, MS, RD, LDN [facility] Inpatient Clinical Dietitian Pager: [phone] After Hours Pager: [phone]

## 2019-01-01 NOTE — Progress Notes (Signed)
Hypoglycemic Event  CBG: 41  Treatment:8 oz juice and eating breakfast  Symptoms: shaking  Follow-up CBG: Time: 0905 CBG Result: 83  Possible Reasons for Event: poor appetite  Comments/MD notified: Dr. Carolanne Grumbling

## 2019-01-01 NOTE — Progress Notes (Signed)
Inpatient Diabetes Program Recommendations  AACE/ADA: New Consensus Statement on Inpatient Glycemic Control (2015)  Target Ranges:  Prepandial:   less than 140 mg/dL      Peak postprandial:   less than 180 mg/dL (1-2 hours)      Critically ill patients:  140 - 180 mg/dL   Results for COHEN, NOVACEK (MRN [id number]) as of 01/01/2019 13:28  Ref. Range 12/31/2018 07:44 12/31/2018 08:17 12/31/2018 12:11 12/31/2018 17:03 12/31/2018 20:59 01/01/2019 00:30 01/01/2019 04:06 01/01/2019 08:30 01/01/2019 09:00 01/01/2019 12:39  Glucose-Capillary Latest Ref Range: 70 - 99 mg/dL 60 (L) 75 IV Solumedrol 40 mg given 149 (H) 221 (H) Novolog 7 units given 215 (H) Novolog 7 units given 185 (H) 80 41 (LL) 83 Lantus 10 units given 255 (H) Novolog 11 units given, IV Solumedrol 40 mg given    Review of Glycemic Control  Diabetes history: DM 2 Outpatient Diabetes medications:  Basaglar 30 units q AM and 10 units q PM  Current orders for Inpatient glycemic control:  Lantus 10 units Daily (ordered today) Novolog Resistant 0-20 units q 4 hours  Solumedrol 40 mg IV q12hours  Inpatient Diabetes Program Recommendations:    Note patient did not have basal insulin ordered yesterday, also received only one dose of IV Solumedrol.  Fasting glucose trends have been low the past 2 days.     -  Consider d/cing Lantus.   -  Consider adjusting Novolog Correction scale to tid and add Novolog 0-5 units qhs for bedtime coverage.   -  Also consider Novolog 2 units meal coverage if patient consumes at least 50% of meals and glucose is at least 80 mg/dl.  Thanks,  Christena Deem RN, MSN, BC-ADM Inpatient Diabetes Coordinator Team Pager [phone] (8a-5p)

## 2019-01-01 NOTE — Progress Notes (Signed)
Patient placed on bipap for increased WOB. RT will continue to monitor.

## 2019-01-01 NOTE — Progress Notes (Signed)
PROGRESS NOTE    Tico Crotteau  GMW:[id number] DOB: [birth year] DOA: 12/04/2018 PCP: Patient, No Pcp Per   Brief Narrative:  Jekhi Peterkinis a 73 y.o.BM PMHx hyperlipidemia, diabetes mellitus type II uncontrolled with complication, COPDon3 L O2 at home, atrial fibrillation on Eliquis,depression,  Presents with altered mental status and shortness of breath. Pt has AMS and isunable to provide any medical history, therefore, most of the history is obtained by discussing the case with ED physician, per EMS report, and with the nursing staff.  Per report, ptdeveloped shortness of breathin the facility,found to have oxygen desaturation to 50%. Pt wasgiven breathing treatment and also treated with a dose of Ativan, then patient became minimally responsive.When I saw pt in ED, he is barelyarousable, seems to move extremities upon painful stimuli. Patient has respiratory distress, but not actively coughing. No active nausea,vomiting or diarrhea noted. Not sure if patient has any pain anywhere.  ED Course:pt was found to have negative COVID19 test, negative Flu PCR,WBC 12.3, lactic acid of 4.9, troponin 0 0.03, urinalysis (cloudy appearance, rare bacteria, WBC 11-20), potassium 5.2, creatinine 0.89, BUN 18, abnormal liver function (ALP 61, AST 137, ALT 110, total bilirubin 0.8), ammonia level 27, temperature normal, atrial fibrillation with RVR, tachypnea, oxygen saturation 100% on 5 L nasal cannula oxygen,ABG withpH 7.182,pCO2103, PO2 142. Chest x-ray showed left upper lobe infiltration. CT head negative for acute intracranial abnormalities. Patient is admitted to stepdown as inpatient.   Assessment & Plan:   Principal Problem:   HCAP (healthcare-associated pneumonia) Active Problems:   HLD (hyperlipidemia)   Diabetes mellitus without complication (HCC)   Depression   COPD (chronic obstructive pulmonary disease) (HCC)   Atrial fibrillation with RVR (HCC)   Acute on  chronic respiratory failure with hypoxia (HCC)   Acute metabolic encephalopathy   Sepsis (HCC)   Hyperkalemia   Abnormal LFTs   Elevated troponin   COPD exacerbation (HCC)   Sepsis due to pneumonia (HCC)   Diabetes mellitus type 2, uncontrolled, with complications ([place])   Anxiety   Sepsis pneumonia/HCAP As previously noted: - Repeat CXR showed multifocal PNA - On admission met criteria for sepsis, leukocytosis and tachycardia. Lactic acid elevated 4.9 -COVID 19negative - PositiveCoronavirus H KU 1 -Urine strep pneumo/Legionella negative -Complete 7 days antibioticsday #6, changed to zosyn 5/3 2/2 cancelled d/c and cxr with pna -Continue withSolu-Medrol 60 mg to BIDbut decrease to 62m - DuoNeb QID - Albuterol neb as needed -Flutter valve -LOST IV ACCESS LAST PM, NO ABX OVERNIGHT, ORDERED PICC-DISCUSSED WITH NURSE LEADERSHIP -Mucinex DM twice daily - 4/30 PT/OT:Likely return [facility] -Out of bed every shift  COPD exacerbation - See sepsis pneumonia  Respiratory failure with hypoxia and hypercapnia -Resolved -See sepsis pneumonia - Titrate O2 to maintain SPO2 89 to 93% -Patient is on 3 L home O2 at baseline  Acute metabolic encephalopathy -Multifactorial sepsis, hypoxia. -Resolved  A. fib with RVR -CHA2DS2-VASc=2 - Most likely secondary to sepsis -Currently NSR - 4/29 restartedhome Eliquis. DiscontinuedLovenox  Elevated troponin - Most likely demand ischemia troponins rose to 0.36. Patient asymptomatic on exam. -EKG at presentation sinus tachycardia no previous EKG for comparison [facility]         Recent Labs  Lab 12/26/18 0645 12/26/18 1124 12/26/18 1438 12/26/18 2030 12/27/18 0300  TROPONINI 0.31* 0.36* 0.31* 0.28* 0.20*    -Troponin resolving -No anginal symptomatology  Diabetes type 2 uncontrolled with complication - 44/40Hemoglobin A1c= 7.6 -4/30 Lantus 30 units daily  -4/30  resistant SSI  HLD - LDL at the ADA guidelines -4/30 startedLipitor  Lactic acidosis - Resolved  Depression/anxiety - Zoloft 50 mg daily -Cont to hold home Ativanfor now  Hyperkalemia -4.8 -Resolved monitor closely  Hypomagnesmia - Magnesium goal>2 - Magnesium1.9g, recheck in AM  Abnormal LFTs -Most likely secondary sepsis will follow   DVT prophylaxis: RX:[id number]  Code Status: dnr    Code Status Orders  (From admission, onward)         Start     Ordered   12/26/18 0016  Do not attempt resuscitation (DNR)  Continuous    Question Answer Comment  In the event of cardiac or respiratory ARREST Do not call a "code blue"   In the event of cardiac or respiratory ARREST Do not perform Intubation, CPR, defibrillation or ACLS   In the event of cardiac or respiratory ARREST Use medication by any route, position, wound care, and other measures to relive pain and suffering. May use oxygen, suction and manual treatment of airway obstruction as needed for comfort.   Comments confirmed with daughter, active DNR in place. OK with bipap      12/26/18 0015        Code Status History    Date Active Date Inactive Code Status Order ID Comments User Context   12/15/2018 2106 12/26/2018 0015 DNR [id number]  Deno Etienne, DO ED   11/19/2018 1531 11/30/2018 0110 Full Code [id number]  [name] Inpatient   11/19/2018 0728 11/19/2018 1531 DNR [id number]  [name] Inpatient     Family Communication: none today Disposition Plan:   Patient was planned for discharge 5/3, unfortunately will continue inpatient secondary to hypoxic respiratory failure.  Patient's discharge complicated by worsening respiratory status in the afternoon requiring 8 L high flow oxygen.  He will need continued high flow oxygen, frequent respiratory monitoring, frequent nursing care, changed IV antibiotic,.  Without these treatments patient be at risk of respiratory compromise and respiratory  failure. Consults called: None Admission status: Inpatient   Consultants:   none  Procedures:  Ct Head Wo Contrast  Result Date: 12/26/2018 CLINICAL DATA:  73 year old male with ataxia, encephalopathy, atrial fibrillation. EXAM: CT HEAD WITHOUT CONTRAST TECHNIQUE: Contiguous axial images were obtained from the base of the skull through the vertex without intravenous contrast. COMPARISON:  None. FINDINGS: Brain: Cerebral volume is within normal limits for age. No midline shift, ventriculomegaly, mass effect, evidence of mass lesion, intracranial hemorrhage or evidence of cortically based acute infarction. Patchy periatrial white matter hypodensity. No cortical encephalomalacia. Vascular: Calcified atherosclerosis at the skull base. No suspicious intracranial vascular hyperdensity. Skull: Negative. Sinuses/Orbits: Visualized paranasal sinuses and mastoids are well pneumatized. Other: Negative orbits. Visualized scalp soft tissues are within normal limits. IMPRESSION: 1. No acute intracranial abnormality. 2. Mild for age nonspecific cerebral white matter changes, most commonly due to small vessel disease. Electronically Signed   By: Genevie Ann M.D.   On: 12/26/2018 01:49   Dg Chest Port 1 View  Result Date: 12/31/2018 CLINICAL DATA:  Dyspnea at rest EXAM: PORTABLE CHEST 1 VIEW COMPARISON:  12/14/2018 chest radiograph. FINDINGS: Stable cardiomediastinal silhouette with normal heart size. No pneumothorax. No left pleural effusion. Chronic blunting of the right costophrenic angle back to 11/26/2018. Hazy upper left lung and right lung base opacity is stable. New left retrocardiac consolidation. IMPRESSION: 1. New left retrocardiac consolidation. Stable hazy upper left lung and right lung base opacity. Findings suggest multilobar pneumonia. 2. Chronic blunting of the right costophrenic angle back  to 11/26/2026 radiograph, compatible with small pleural effusion and/or pleural-parenchymal scarring. Electronically  Signed   By: Ilona Sorrel M.D.   On: 12/31/2018 15:01   Dg Chest Port 1 View  Result Date: 12/09/2018 CLINICAL DATA:  73 year old male with sepsis. EXAM: PORTABLE CHEST 1 VIEW COMPARISON:  11/26/2018 and earlier. FINDINGS: Portable AP supine view at 2057 hours. Stable large lung volumes. Stable cardiac size and mediastinal contours. Visualized tracheal air column is within normal limits. No pneumothorax, pleural effusion or consolidation. New asymmetric reticulonodular opacity in the left upper lung. No acute osseous abnormality identified. IMPRESSION: New left upper lobe reticulonodular pulmonary opacity suspicious for acute viral/atypical respiratory infection. Underlying pulmonary hyperinflation. Electronically Signed   By: Genevie Ann M.D.   On: 12/24/2018 22:02   [place] Ekg Site Rite  Result Date: 01/01/2019 If Site Rite image not attached, placement could not be confirmed due to current cardiac rhythm.    Antimicrobials:   Ceftriaxone azithromycin day 6 last day May 3  Zosyn started May 3    Subjective: Reports pain short of breath this morning.  On BiPAP, has eased  is easy work of breathing  Objective: Vitals:   01/01/19 0413 01/01/19 0758 01/01/19 0859 01/01/19 1025  BP: (!) 127/58  (!) 119/51   Pulse: 83   95  Resp: (!) 27   (!) 31  Temp: 98.2 F (36.8 C)  98.1 F (36.7 C)   TempSrc: Oral  Oral   SpO2: 99% 97%  93%  Weight:        Intake/Output Summary (Last 24 hours) at 01/01/2019 1254 Last data filed at 01/01/2019 0800 Gross per 24 hour  Intake 960 ml  Output 400 ml  Net 560 ml   Filed Weights   12/29/18 0314 12/30/18 0549 12/31/18 0444  Weight: 67.5 kg 69.6 kg 68.6 kg    Examination:  General exam: Appears calm, on BiPAP Respiratory system: Mild rhonchi bilaterally, no accessory muscle use, transmitted ventilator sounds with BiPAP Cardiovascular system: S1 & S2 heard, RRR. No JVD, murmurs, rubs, gallops or clicks. No pedal edema. Gastrointestinal system: Abdomen  is nondistended, soft and nontender.  Normal bowel sounds heard. Central nervous system: Alert and oriented. No focal neurological deficits. Extremities: WWP, no edema Skin: No rashes, lesions or ulcers Psychiatry: Judgement and insight appear normal. Mood & affect appropriate.     Data Reviewed: I have personally reviewed following labs and imaging studies  CBC: Recent Labs  Lab 12/02/2018 2102 12/26/18 0831  12/28/18 0345 12/29/18 1008 12/30/18 0228 12/31/18 0403 01/01/19 0454  WBC 12.3* 8.6   < > 6.8 13.1* 9.4 8.7 7.9  NEUTROABS 10.8* 7.6  --   --   --   --   --   --   HGB 9.6* 8.1*   < > 8.0* 9.2* 8.8* 8.2* 8.5*  HCT 34.1* 28.1*   < > 27.9* 30.1* 31.0* 28.6* 30.2*  MCV 101.5* 99.6   < > 98.6 99.0 100.3* 101.4* 101.0*  PLT 250 194   < > 185 210 180 176 185   < > = values in this interval not displayed.   Basic Metabolic Panel: Recent Labs  Lab 12/28/18 0345 12/29/18 0312 12/30/18 0228 12/31/18 0403 01/01/19 0454  NA 139 140 142 142 144  K 4.3 4.9 4.8 4.4 4.1  CL 95* 94* 95* 93* 95*  CO2 38* 38* 42* 40* 44*  GLUCOSE 147* 294* 117* 123* 72  BUN 15 26* '19 18 18  ' CREATININE 0.54*  0.73 0.54* 0.49* 0.47*  CALCIUM 9.2 9.4 9.3 9.3 9.2  MG 1.7 2.1 1.9 1.8 1.8   GFR: CrCl cannot be calculated (Unknown ideal weight.). Liver Function Tests: Recent Labs  Lab 12/14/2018 2102 12/26/18 0831 12/26/18 2030  AST 137* 51* 37  ALT 110* 83* 72*  ALKPHOS 61 49 54  BILITOT 0.8 0.5 0.4  PROT 6.6 5.6* 5.9*  ALBUMIN 2.5* 2.1* 2.3*   No results for input(s): LIPASE, AMYLASE in the last 168 hours. Recent Labs  Lab 12/26/18 0042  AMMONIA 27   Coagulation Profile: No results for input(s): INR, PROTIME in the last 168 hours. Cardiac Enzymes: Recent Labs  Lab 12/26/18 0645 12/26/18 1124 12/26/18 1438 12/26/18 2030 12/27/18 0300  TROPONINI 0.31* 0.36* 0.31* 0.28* 0.20*   BNP (last 3 results) No results for input(s): PROBNP in the last 8760 hours. HbA1C: No results for  input(s): HGBA1C in the last 72 hours. CBG: Recent Labs  Lab 01/01/19 0030 01/01/19 0406 01/01/19 0830 01/01/19 0900 01/01/19 1239  GLUCAP 185* 80 41* 83 255*   Lipid Profile: No results for input(s): CHOL, HDL, LDLCALC, TRIG, CHOLHDL, LDLDIRECT in the last 72 hours. Thyroid Function Tests: No results for input(s): TSH, T4TOTAL, FREET4, T3FREE, THYROIDAB in the last 72 hours. Anemia Panel: No results for input(s): VITAMINB12, FOLATE, FERRITIN, TIBC, IRON, RETICCTPCT in the last 72 hours. Sepsis Labs: Recent Labs  Lab 12/26/18 0042 12/26/18 0320 12/26/18 1438 12/26/18 2030  PROCALCITON 0.17  --   --   --   LATICACIDVEN 1.2 3.4* 2.4* 0.9    Recent Results (from the past 240 hour(s))  SARS Coronavirus 2 [facility] order, Performed in [place] hospital lab)     Status: None   Collection Time: 12/24/2018  8:45 PM  Result Value Ref Range Status   SARS Coronavirus 2 NEGATIVE NEGATIVE Final    Comment: (NOTE) If result is NEGATIVE SARS-CoV-2 target nucleic acids are NOT DETECTED. The SARS-CoV-2 RNA is generally detectable in upper and lower  respiratory specimens during the acute phase of infection. The lowest  concentration of SARS-CoV-2 viral copies this assay can detect is 250  copies / mL. A negative result does not preclude SARS-CoV-2 infection  and should not be used as the sole basis for treatment or other  patient management decisions.  A negative result may occur with  improper specimen collection / handling, submission of specimen other  than nasopharyngeal swab, presence of viral mutation(s) within the  areas targeted by this assay, and inadequate number of viral copies  (<250 copies / mL). A negative result must be combined with clinical  observations, patient history, and epidemiological information. If result is POSITIVE SARS-CoV-2 target nucleic acids are DETECTED. The SARS-CoV-2 RNA is generally detectable in upper and lower  respiratory specimens dur ing  the acute phase of infection.  Positive  results are indicative of active infection with SARS-CoV-2.  Clinical  correlation with patient history and other diagnostic information is  necessary to determine patient infection status.  Positive results do  not rule out bacterial infection or co-infection with other viruses. If result is PRESUMPTIVE POSTIVE SARS-CoV-2 nucleic acids MAY BE PRESENT.   A presumptive positive result was obtained on the submitted specimen  and confirmed on repeat testing.  While 2019 novel coronavirus  (SARS-CoV-2) nucleic acids may be present in the submitted sample  additional confirmatory testing may be necessary for epidemiological  and / or clinical management purposes  to differentiate between  SARS-CoV-2 and other Sarbecovirus  currently known to infect humans.  If clinically indicated additional testing with an alternate test  methodology [id number]) is advised. The SARS-CoV-2 RNA is generally  detectable in upper and lower respiratory sp ecimens during the acute  phase of infection. The expected result is Negative. Fact Sheet for Patients:  StrictlyIdeas.no Fact Sheet for Healthcare Providers: BankingDealers.co.za This test is not yet approved or cleared by the [place] FDA and has been authorized for detection and/or diagnosis of SARS-CoV-2 by FDA under an Emergency Use Authorization (EUA).  This EUA will remain in effect (meaning this test can be used) for the duration of the COVID-19 declaration under Section 564(b)(1) of the Act, 21 U.S.C. section 360bbb-3(b)(1), unless the authorization is terminated or revoked sooner. Performed at [facility], [place] [address], [place], [place] [address]   Blood Culture (routine x 2)     Status: Abnormal   Collection Time: 12/19/2018  8:55 PM  Result Value Ref Range Status   Specimen Description BLOOD LEFT ARM  Final   Special Requests   Final    BOTTLES  DRAWN AEROBIC AND ANAEROBIC Blood Culture results may not be optimal due to an inadequate volume of blood received in culture bottles   Culture  Setup Time   Final    GRAM POSITIVE COCCI AEROBIC BOTTLE ONLY CRITICAL RESULT CALLED TO, READ BACK BY AND VERIFIED WITH: J ORIET PHARMD 12/28/18 0013 JDW    Culture (A)  Final    STAPHYLOCOCCUS SPECIES (COAGULASE NEGATIVE) THE SIGNIFICANCE OF ISOLATING THIS ORGANISM FROM A SINGLE SET OF BLOOD CULTURES WHEN MULTIPLE SETS ARE DRAWN IS UNCERTAIN. PLEASE NOTIFY THE MICROBIOLOGY DEPARTMENT WITHIN ONE WEEK IF SPECIATION AND SENSITIVITIES ARE REQUIRED. Performed at [facility], [place] [address] [place], [place] [address]    Report Status 12/29/2018 FINAL  Final  Blood Culture ID Panel (Reflexed)     Status: Abnormal   Collection Time: 12/24/2018  8:55 PM  Result Value Ref Range Status   Enterococcus species NOT DETECTED NOT DETECTED Final   Listeria monocytogenes NOT DETECTED NOT DETECTED Final   Staphylococcus species DETECTED (A) NOT DETECTED Final    Comment: Methicillin (oxacillin) resistant coagulase negative staphylococcus. Possible blood culture contaminant (unless isolated from more than one blood culture draw or clinical case suggests pathogenicity). No antibiotic treatment is indicated for blood  culture contaminants.    Staphylococcus aureus (BCID) NOT DETECTED NOT DETECTED Final   Methicillin resistance DETECTED (A) NOT DETECTED Final    Comment: CRITICAL RESULT CALLED TO, READ BACK BY AND VERIFIED WITH: J ORIET PHARMD 12/28/18 0013 JDW    Streptococcus species NOT DETECTED NOT DETECTED Final   Streptococcus agalactiae NOT DETECTED NOT DETECTED Final   Streptococcus pneumoniae NOT DETECTED NOT DETECTED Final   Streptococcus pyogenes NOT DETECTED NOT DETECTED Final   Acinetobacter baumannii NOT DETECTED NOT DETECTED Final   Enterobacteriaceae species NOT DETECTED NOT DETECTED Final   Enterobacter cloacae complex NOT DETECTED NOT  DETECTED Final   Escherichia coli NOT DETECTED NOT DETECTED Final   Klebsiella oxytoca NOT DETECTED NOT DETECTED Final   Klebsiella pneumoniae NOT DETECTED NOT DETECTED Final   Proteus species NOT DETECTED NOT DETECTED Final   Serratia marcescens NOT DETECTED NOT DETECTED Final   Haemophilus influenzae NOT DETECTED NOT DETECTED Final   Neisseria meningitidis NOT DETECTED NOT DETECTED Final   Pseudomonas aeruginosa NOT DETECTED NOT DETECTED Final   Candida albicans NOT DETECTED NOT DETECTED Final   Candida glabrata NOT DETECTED NOT DETECTED  Final   Candida krusei NOT DETECTED NOT DETECTED Final   Candida parapsilosis NOT DETECTED NOT DETECTED Final   Candida tropicalis NOT DETECTED NOT DETECTED Final    Comment: Performed at [facility], [place] [address] [place], [place] [address]  Blood Culture (routine x 2)     Status: None   Collection Time: 12/09/2018  9:00 PM  Result Value Ref Range Status   Specimen Description BLOOD RIGHT ARM  Final   Special Requests   Final    BOTTLES DRAWN AEROBIC AND ANAEROBIC Blood Culture results may not be optimal due to an excessive volume of blood received in culture bottles   Culture   Final    NO GROWTH 5 DAYS Performed at [facility], [place] [address] [place], [place] [address]    Report Status 12/30/2018 FINAL  Final  Respiratory Panel by PCR     Status: Abnormal   Collection Time: 12/26/18  1:12 AM  Result Value Ref Range Status   Adenovirus NOT DETECTED NOT DETECTED Final   Coronavirus 229E NOT DETECTED NOT DETECTED Final    Comment: (NOTE) The Coronavirus on the Respiratory Panel, DOES NOT test for the novel  Coronavirus (2019 nCoV)    Coronavirus HKU1 DETECTED (A) NOT DETECTED Final   Coronavirus NL63 NOT DETECTED NOT DETECTED Final   Coronavirus OC43 NOT DETECTED NOT DETECTED Final   Metapneumovirus NOT DETECTED NOT DETECTED Final   Rhinovirus / Enterovirus NOT DETECTED NOT DETECTED Final   Influenza A NOT DETECTED NOT  DETECTED Final   Influenza B NOT DETECTED NOT DETECTED Final   Parainfluenza Virus 1 NOT DETECTED NOT DETECTED Final   Parainfluenza Virus 2 NOT DETECTED NOT DETECTED Final   Parainfluenza Virus 3 NOT DETECTED NOT DETECTED Final   Parainfluenza Virus 4 NOT DETECTED NOT DETECTED Final   Respiratory Syncytial Virus NOT DETECTED NOT DETECTED Final   Bordetella pertussis NOT DETECTED NOT DETECTED Final   Chlamydophila pneumoniae NOT DETECTED NOT DETECTED Final   Mycoplasma pneumoniae NOT DETECTED NOT DETECTED Final    Comment: Performed at [facility], [place] [address], [place], [place] [address]  Urine culture     Status: Abnormal   Collection Time: 12/26/18  2:59 AM  Result Value Ref Range Status   Specimen Description URINE, CLEAN CATCH  Final   Special Requests NONE  Final   Culture (A)  Final    <10,000 COLONIES/mL INSIGNIFICANT GROWTH Performed at [facility], [place] [address], [place], [place] [address]    Report Status 12/26/2018 FINAL  Final  MRSA PCR Screening     Status: None   Collection Time: 12/26/18  4:45 PM  Result Value Ref Range Status   MRSA by PCR NEGATIVE NEGATIVE Final    Comment:        The GeneXpert MRSA Assay (FDA approved for NASAL specimens only), is one component of a comprehensive MRSA colonization surveillance program. It is not intended to diagnose MRSA infection nor to guide or monitor treatment for MRSA infections. Performed at [facility], [place] [address], [place], [place] [address]   Culture, blood (routine x 2)     Status: None (Preliminary result)   Collection Time: 12/30/18  9:30 AM  Result Value Ref Range Status   Specimen Description BLOOD RIGHT THUMB  Final   Special Requests   Final    BOTTLES DRAWN AEROBIC ONLY Blood Culture results may not be optimal due to an inadequate  volume of blood received in culture bottles   Culture   Final    NO GROWTH 2 DAYS Performed at [facility], [place] [address], [place],  [place] [address]    Report Status PENDING  Incomplete  Culture, blood (routine x 2)     Status: None (Preliminary result)   Collection Time: 12/30/18  4:32 PM  Result Value Ref Range Status   Specimen Description BLOOD SITE NOT SPECIFIED  Final   Special Requests AEROBIC BOTTLE ONLY Blood Culture adequate volume  Final   Culture   Final    NO GROWTH 2 DAYS Performed at [facility], [address] [place], [place] [address]    Report Status PENDING  Incomplete         Radiology Studies: Dg Chest Port 1 View  Result Date: 12/31/2018 CLINICAL DATA:  Dyspnea at rest EXAM: PORTABLE CHEST 1 VIEW COMPARISON:  12/06/2018 chest radiograph. FINDINGS: Stable cardiomediastinal silhouette with normal heart size. No pneumothorax. No left pleural effusion. Chronic blunting of the right costophrenic angle back to 11/26/2018. Hazy upper left lung and right lung base opacity is stable. New left retrocardiac consolidation. IMPRESSION: 1. New left retrocardiac consolidation. Stable hazy upper left lung and right lung base opacity. Findings suggest multilobar pneumonia. 2. Chronic blunting of the right costophrenic angle back to 11/26/2026 radiograph, compatible with small pleural effusion and/or pleural-parenchymal scarring. Electronically Signed   By: Ilona Sorrel M.D.   On: 12/31/2018 15:01   [place] Ekg Site Rite  Result Date: 01/01/2019 If Site Rite image not attached, placement could not be confirmed due to current cardiac rhythm.       Scheduled Meds: . apixaban  5 mg Oral BID  . aspirin EC  81 mg Oral Daily  . atorvastatin  20 mg Oral q1800  . dextromethorphan-guaiFENesin  1 tablet Oral BID  . famotidine  20 mg Oral Daily  . feeding supplement (PRO-STAT SUGAR FREE 64)  30 mL Oral BID  . fluticasone  2 spray Each Nare Daily  . insulin aspart  0-20 Units Subcutaneous Q4H  . insulin glargine  10 Units Subcutaneous Daily  . methylPREDNISolone (SOLU-MEDROL) injection  40 mg Intravenous BID  .  Ensure Max Protein  11 oz Oral Daily  . sertraline  50 mg Oral Daily  . sodium chloride flush  10-40 mL Intracatheter Q12H   Continuous Infusions: . sodium chloride 75 mL/hr at 01/01/19 1248  . piperacillin-tazobactam (ZOSYN)  IV       LOS: 6 days    Time spent: 51 min    Nicolette Bang, MD [facility]  If 7PM-7AM, please contact night-coverage  01/01/2019, 12:54 PM

## 2019-01-01 NOTE — Progress Notes (Signed)
Peripherally Inserted Central Catheter/Midline Placement  The IV Nurse has discussed with the patient and/or persons authorized to consent for the patient, the purpose of this procedure and the potential benefits and risks involved with this procedure.  The benefits include less needle sticks, lab draws from the catheter, and the patient may be discharged home with the catheter. Risks include, but not limited to, infection, bleeding, blood clot (thrombus formation), and puncture of an artery; nerve damage and irregular heartbeat and possibility to perform a PICC exchange if needed/ordered by physician.  Alternatives to this procedure were also discussed.  [employer] PICC patient education guide, fact sheet on infection prevention and patient information card has been provided to patient /or left at bedside.    PICC/Midline Placement Documentation        Gregory Stone 01/01/2019, 12:21 PM

## 2019-01-02 LAB — RESPIRATORY PANEL BY PCR

## 2019-01-02 LAB — GLUCOSE, CAPILLARY
Glucose-Capillary: 132 mg/dL — ABNORMAL HIGH (ref 70–99)
Glucose-Capillary: 179 mg/dL — ABNORMAL HIGH (ref 70–99)
Glucose-Capillary: 155 mg/dL — ABNORMAL HIGH (ref 70–99)
Glucose-Capillary: 269 mg/dL — ABNORMAL HIGH (ref 70–99)
Glucose-Capillary: 170 mg/dL — ABNORMAL HIGH (ref 70–99)

## 2019-01-02 LAB — SARS CORONAVIRUS 2 BY RT PCR (HOSPITAL ORDER, PERFORMED IN ~~LOC~~ HOSPITAL LAB): SARS Coronavirus 2: NEGATIVE

## 2019-01-02 MED ORDER — MAGNESIUM SULFATE 2 GM/50ML IV SOLN
2.0000 g | Freq: Once | INTRAVENOUS | Status: AC
  Administered 2019-01-02: 15:00:00 2 g via INTRAVENOUS
  Filled 2019-01-02: qty 50

## 2019-01-02 NOTE — Progress Notes (Signed)
PT Cancellation Note  Patient Details Name: Gregory Stone MRN: [id number] DOB: May 07, 1946   Cancelled Treatment:    Reason Eval/Treat Not Completed: Other (comment). Pt with change in status with pt now being retested for Covid 19 Per health system leadership, therapy services are being held at this time until patient tests negative for COVID-19.      Enedina Finner [name] 01/02/2019, 10:18 AM Delaney Meigs, PT Acute Rehabilitation Services Pager: [phone] Office: [phone]

## 2019-01-02 NOTE — Progress Notes (Signed)
PROGRESS NOTE    Gregory Stone  OYD:[id number] DOB: 1945/10/24 DOA: 12/03/2018 PCP: Patient, No Pcp Per   Brief Narrative:  Mayjor Peterkinis a 73 y.o.BM PMHx hyperlipidemia, diabetes mellitus type II uncontrolled with complication, COPDon3 L O2 at home, atrial fibrillation on Eliquis,depression,  Presents with altered mental status and shortness of breath. Pt has AMS and isunable to provide any medical history, therefore, most of the history is obtained by discussing the case with ED physician, per EMS report, and with the nursing staff.  Per report, ptdeveloped shortness of breathin the facility,found to have oxygen desaturation to 50%. Pt wasgiven breathing treatment and also treated with a dose of Ativan, then patient became minimally responsive.When I saw pt in ED, he is barelyarousable, seems to move extremities upon painful stimuli. Patient has respiratory distress, but not actively coughing. No active nausea,vomiting or diarrhea noted. Not sure if patient has any pain anywhere.  ED Course:pt was found to have negative COVID19 test, negative Flu PCR,WBC 12.3, lactic acid of 4.9, troponin 0 0.03, urinalysis (cloudy appearance, rare bacteria, WBC 11-20), potassium 5.2, creatinine 0.89, BUN 18, abnormal liver function (ALP 61, AST 137, ALT 110, total bilirubin 0.8), ammonia level 27, temperature normal, atrial fibrillation with RVR, tachypnea, oxygen saturation 100% on 5 L nasal cannula oxygen,ABG withpH 7.182,pCO2103, PO2 142. Chest x-ray showed left upper lobe infiltration. CT head negative for acute intracranial abnormalities. Patient is admitted to stepdown as inpatient.  Since admission patient was improving and was scheduled for discharge on Sunday-d/c summ and dc was completed.  Unfortunately became more hypoxic later in the afternoon and chest x-ray revealed new pneumonia for which antibiotics were started.  He required BiPAP and high flow nasal cannula.  I  retested COVID which was negative and retested RVP which is pending.   Assessment & Plan:   Principal Problem:   HCAP (healthcare-associated pneumonia) Active Problems:   HLD (hyperlipidemia)   Diabetes mellitus without complication (HCC)   Depression   COPD (chronic obstructive pulmonary disease) (HCC)   Atrial fibrillation with RVR (HCC)   Acute on chronic respiratory failure with hypoxia (HCC)   Acute metabolic encephalopathy   Sepsis (HCC)   Hyperkalemia   Abnormal LFTs   Elevated troponin   COPD exacerbation (HCC)   Sepsis due to pneumonia (HCC)   Diabetes mellitus type 2, uncontrolled, with complications ([place])   Anxiety   Sepsis pneumonia/HCAP/copd As previously noted: - Repeat CXR showed multifocal PNA - On admission met criteria for sepsis, leukocytosis and tachycardia. Lactic acid elevated 4.9 -COVID 19negative-rechecked 5/5 remains neg - PositiveCoronavirus H KU 1 -Urine strep pneumo/Legionella negative - changed to zosyn 5/3 2/2 cancelled d/c 2/2 cxr with pna and hypoxia requiring 8 L high flow oxygen -Continue withSolu-Medrol 60 mg to BIDbut decrease to 32m, continue to wean change to p.o. tomorrow if patient stable without wheezing - DuoNeb QID - Albuterol neb as needed -Flutter valve -PICC in place 2/2 lost IV access -Mucinex DM twice daily - 4/30 PT/OT:Likely return to patient's prior skilled nursing facility [facility] -Out of bed every shift  COPD exacerbation - See sepsis pneumonia  Respiratory failure with hypoxia and hypercapnia -Continue treatment as above, wean oxygen to baseline 3 L as tolerated -See sepsis pneumonia - Titrate O2 to maintain SPO2 89 to 93% --Antibiotics as above  Acute metabolic encephalopathy -Multifactorial sepsis, hypoxia. -Resolved  A. fib with RVR -CHA2DS2-VASc=2 - Most likely secondary to sepsis -Currently NSR - 4/29 restartedhome Eliquis. DiscontinuedLovenox  Elevated troponin - Most  likely  demand ischemia troponins rose to 0.36. Patient asymptomatic on exam. -EKG at presentation sinus tachycardia no previous EKG for comparison LastLabs         Recent Labs  Lab 12/26/18 0645 12/26/18 1124 12/26/18 1438 12/26/18 2030 12/27/18 0300  TROPONINI 0.31* 0.36* 0.31* 0.28* 0.20*    -Troponin resolving -No anginal symptomatology  Diabetes type 2 uncontrolled with complication - 5/17 Hemoglobin A1c= 7.6 -4/30 Lantus 30 units daily -4/30 resistant SSI  HLD - LDL at the ADA guidelines -4/30 startedLipitor  Lactic acidosis - Resolved  Depression/anxiety - Zoloft 50 mg daily -Cont to hold home Ativanfor now  Hyperkalemia -4.1 -Resolved monitor closely  Hypomagnesmia - Magnesium goal>2 - Magnesium1.8g, replete, recheck in AM  Abnormal LFTs -Most likely secondary sepsis will follow   DVT prophylaxis: [place]:[id number]  Code Status: DNR    Code Status Orders  (From admission, onward)         Start     Ordered   12/26/18 0016  Do not attempt resuscitation (DNR)  Continuous    Question Answer Comment  In the event of cardiac or respiratory ARREST Do not call a code blue   In the event of cardiac or respiratory ARREST Do not perform Intubation, CPR, defibrillation or ACLS   In the event of cardiac or respiratory ARREST Use medication by any route, position, wound care, and other measures to relive pain and suffering. May use oxygen, suction and manual treatment of airway obstruction as needed for comfort.   Comments confirmed with daughter, active DNR in place. OK with bipap      12/26/18 0015        Code Status History    Date Active Date Inactive Code Status Order ID Comments User Context   12/03/2018 2106 12/26/2018 0015 DNR [id number]  Deno Etienne, DO ED   11/19/2018 1531 11/30/2018 0110 Full Code [id number]  [name] Inpatient   11/19/2018 0728 11/19/2018 1531 DNR [id number]  [name] Inpatient     Family  Communication: Tried calling [name], No answer Disposition Plan:    Patient was planned for discharge 5/3, unfortunately will continue inpatient secondary to hypoxic respiratory failure. Patient's discharge complicated by worsening respiratory status in the afternoon requiring 8 L high flow oxygen. He will need continued high flow oxygen, frequent respiratory monitoring, frequent nursing care, changed IV antibiotic,. Without these treatments patient be at risk of respiratory compromise and respiratory failure. Consults called: None Admission status: Inpatient   Consultants:   None  Procedures:  Ct Head Wo Contrast  Result Date: 12/26/2018 CLINICAL DATA:  73 year old male with ataxia, encephalopathy, atrial fibrillation. EXAM: CT HEAD WITHOUT CONTRAST TECHNIQUE: Contiguous axial images were obtained from the base of the skull through the vertex without intravenous contrast. COMPARISON:  None. FINDINGS: Brain: Cerebral volume is within normal limits for age. No midline shift, ventriculomegaly, mass effect, evidence of mass lesion, intracranial hemorrhage or evidence of cortically based acute infarction. Patchy periatrial white matter hypodensity. No cortical encephalomalacia. Vascular: Calcified atherosclerosis at the skull base. No suspicious intracranial vascular hyperdensity. Skull: Negative. Sinuses/Orbits: Visualized paranasal sinuses and mastoids are well pneumatized. Other: Negative orbits. Visualized scalp soft tissues are within normal limits. IMPRESSION: 1. No acute intracranial abnormality. 2. Mild for age nonspecific cerebral white matter changes, most commonly due to small vessel disease. Electronically Signed   By: Genevie Ann M.D.   On: 12/26/2018 01:49   Dg Chest Port 1 View  Result Date: 12/31/2018 CLINICAL DATA:  Dyspnea at rest EXAM: PORTABLE CHEST 1 VIEW COMPARISON:  12/22/2018 chest radiograph. FINDINGS: Stable cardiomediastinal silhouette with normal heart size. No pneumothorax.  No left pleural effusion. Chronic blunting of the right costophrenic angle back to 11/26/2018. Hazy upper left lung and right lung base opacity is stable. New left retrocardiac consolidation. IMPRESSION: 1. New left retrocardiac consolidation. Stable hazy upper left lung and right lung base opacity. Findings suggest multilobar pneumonia. 2. Chronic blunting of the right costophrenic angle back to 11/26/2026 radiograph, compatible with small pleural effusion and/or pleural-parenchymal scarring. Electronically Signed   By: Ilona Sorrel M.D.   On: 12/31/2018 15:01   Dg Chest Port 1 View  Result Date: 12/12/2018 CLINICAL DATA:  73 year old male with sepsis. EXAM: PORTABLE CHEST 1 VIEW COMPARISON:  11/26/2018 and earlier. FINDINGS: Portable AP supine view at 2057 hours. Stable large lung volumes. Stable cardiac size and mediastinal contours. Visualized tracheal air column is within normal limits. No pneumothorax, pleural effusion or consolidation. New asymmetric reticulonodular opacity in the left upper lung. No acute osseous abnormality identified. IMPRESSION: New left upper lobe reticulonodular pulmonary opacity suspicious for acute viral/atypical respiratory infection. Underlying pulmonary hyperinflation. Electronically Signed   By: Genevie Ann M.D.   On: 12/24/2018 22:02   [place] Ekg Site Rite  Result Date: 01/01/2019 If Site Rite image not attached, placement could not be confirmed due to current cardiac rhythm.    Antimicrobials:   Ceftriaxone azithromycin day 6 last day May 3  Zosyn started May 3     Subjective: Patient's respiratory status moderately improved today, has been weaned off BiPAP, Still reports feeling tired, Currently on nasal cannula  Objective: Vitals:   01/02/19 0440 01/02/19 0759 01/02/19 1137 01/02/19 1139  BP: 129/74 (!) 156/83 (!) 152/76   Pulse: 86 97 86   Resp: (!) 25 (!) 22 (!) 28 (!) 24  Temp: (!) 97.5 F (36.4 C) 98.2 F (36.8 C) 98.6 F (37 C)   TempSrc: Oral  Oral Oral   SpO2: 97% 100% 100%   Weight: 70.5 kg       Intake/Output Summary (Last 24 hours) at 01/02/2019 1354 Last data filed at 01/02/2019 1143 Gross per 24 hour  Intake 1810.31 ml  Output 2650 ml  Net -839.69 ml   Filed Weights   12/30/18 0549 12/31/18 0444 01/02/19 0440  Weight: 69.6 kg 68.6 kg 70.5 kg    Examination:  General exam: Appears calm and comfortable  Respiratory system: rales and rhonchi bilat, trace wheezing. Cardiovascular system: S1 & S2 heard, RRR. No JVD, murmurs, rubs, gallops or clicks. No pedal edema. Gastrointestinal system: Abdomen is nondistended, soft and nontender. Normal bowel sounds heard. Central nervous system: Alert and oriented. No focal neurological deficits. Extremities: WWP, trace edema. Skin: No rashes, lesions or ulcers Psychiatry: Judgement and insight appear normal. Mood & affect appropriate.     Data Reviewed: I have personally reviewed following labs and imaging studies  CBC: Recent Labs  Lab 12/28/18 0345 12/29/18 1008 12/30/18 0228 12/31/18 0403 01/01/19 0454  WBC 6.8 13.1* 9.4 8.7 7.9  HGB 8.0* 9.2* 8.8* 8.2* 8.5*  HCT 27.9* 30.1* 31.0* 28.6* 30.2*  MCV 98.6 99.0 100.3* 101.4* 101.0*  PLT 185 210 180 176 390   Basic Metabolic Panel: Recent Labs  Lab 12/28/18 0345 12/29/18 0312 12/30/18 0228 12/31/18 0403 01/01/19 0454  NA 139 140 142 142 144  K 4.3 4.9 4.8 4.4 4.1  CL 95* 94* 95* 93* 95*  CO2 38* 38* 42* 40* 44*  GLUCOSE 147* 294* 117* 123* 72  BUN 15 26* _0 CREATININE 0.54* 0.73 0.54* 0.49* 0.47*  CALCIUM 9.2 9.4 9.3 9.3 9.2  MG 1.7 2.1 1.9 1.8 1.8   GFR: CrCl cannot be calculated (Unknown ideal weight.). Liver Function Tests: Recent Labs  Lab 12/26/18 2030  AST 37  ALT 72*  ALKPHOS 54  BILITOT 0.4  PROT 5.9*  ALBUMIN 2.3*   No results for input(s): LIPASE, AMYLASE in the last 168 hours. No results for input(s): AMMONIA in the last 168 hours. Coagulation Profile: No results for  input(s): INR, PROTIME in the last 168 hours. Cardiac Enzymes: Recent Labs  Lab 12/26/18 1438 12/26/18 2030 12/27/18 0300  TROPONINI 0.31* 0.28* 0.20*   BNP (last 3 results) No results for input(s): PROBNP in the last 8760 hours. HbA1C: No results for input(s): HGBA1C in the last 72 hours. CBG: Recent Labs  Lab 01/01/19 1957 01/01/19 2352 01/02/19 0436 01/02/19 0802 01/02/19 1133  GLUCAP 198* 149* 132* 179* 155*   Lipid Profile: No results for input(s): CHOL, HDL, LDLCALC, TRIG, CHOLHDL, LDLDIRECT in the last 72 hours. Thyroid Function Tests: No results for input(s): TSH, T4TOTAL, FREET4, T3FREE, THYROIDAB in the last 72 hours. Anemia Panel: No results for input(s): VITAMINB12, FOLATE, FERRITIN, TIBC, IRON, RETICCTPCT in the last 72 hours. Sepsis Labs: Recent Labs  Lab 12/26/18 1438 12/26/18 2030  LATICACIDVEN 2.4* 0.9    Recent Results (from the past 240 hour(s))  SARS Coronavirus 2 [facility] order, Performed in [facility] hospital lab)     Status: None   Collection Time: 12/24/2018  8:45 PM  Result Value Ref Range Status   SARS Coronavirus 2 NEGATIVE NEGATIVE Final    Comment: (NOTE) If result is NEGATIVE SARS-CoV-2 target nucleic acids are NOT DETECTED. The SARS-CoV-2 RNA is generally detectable in upper and lower  respiratory specimens during the acute phase of infection. The lowest  concentration of SARS-CoV-2 viral copies this assay can detect is 250  copies / mL. A negative result does not preclude SARS-CoV-2 infection  and should not be used as the sole basis for treatment or other  patient management decisions.  A negative result may occur with  improper specimen collection / handling, submission of specimen other  than nasopharyngeal swab, presence of viral mutation(s) within the  areas targeted by this assay, and inadequate number of viral copies  (<250 copies / mL). A negative result must be combined with clinical  observations, patient history, and  epidemiological information. If result is POSITIVE SARS-CoV-2 target nucleic acids are DETECTED. The SARS-CoV-2 RNA is generally detectable in upper and lower  respiratory specimens dur ing the acute phase of infection.  Positive  results are indicative of active infection with SARS-CoV-2.  Clinical  correlation with patient history and other diagnostic information is  necessary to determine patient infection status.  Positive results do  not rule out bacterial infection or co-infection with other viruses. If result is PRESUMPTIVE POSTIVE SARS-CoV-2 nucleic acids MAY BE PRESENT.   A presumptive positive result was obtained on the submitted specimen  and confirmed on repeat testing.  While 2019 novel coronavirus  (SARS-CoV-2) nucleic acids may be present in the submitted sample  additional confirmatory testing may be necessary for epidemiological  and / or clinical management purposes  to differentiate between  SARS-CoV-2 and other Sarbecovirus currently known to infect humans.  If clinically indicated additional testing with an alternate test  methodology [phone]) is advised. The SARS-CoV-2 RNA is generally  detectable in upper and lower respiratory sp ecimens during the acute  phase of infection. The expected result is Negative. Fact Sheet for Patients:  StrictlyIdeas.no Fact Sheet for Healthcare Providers: BankingDealers.co.za This test is not yet approved or cleared by the [place] FDA and has been authorized for detection and/or diagnosis of SARS-CoV-2 by FDA under an Emergency Use Authorization (EUA).  This EUA will remain in effect (meaning this test can be used) for the duration of the COVID-19 declaration under Section 564(b)(1) of the Act, 21 U.S.C. section 360bbb-3(b)(1), unless the authorization is terminated or revoked sooner. Performed at [facility], [place] [address], [place], [place] [address]   Blood Culture  (routine x 2)     Status: Abnormal   Collection Time: 12/28/2018  8:55 PM  Result Value Ref Range Status   Specimen Description BLOOD LEFT ARM  Final   Special Requests   Final    BOTTLES DRAWN AEROBIC AND ANAEROBIC Blood Culture results may not be optimal due to an inadequate volume of blood received in culture bottles   Culture  Setup Time   Final    GRAM POSITIVE COCCI AEROBIC BOTTLE ONLY CRITICAL RESULT CALLED TO, READ BACK BY AND VERIFIED WITH: J ORIET PHARMD 12/28/18 0013 JDW    Culture (A)  Final    STAPHYLOCOCCUS SPECIES (COAGULASE NEGATIVE) THE SIGNIFICANCE OF ISOLATING THIS ORGANISM FROM A SINGLE SET OF BLOOD CULTURES WHEN MULTIPLE SETS ARE DRAWN IS UNCERTAIN. PLEASE NOTIFY THE MICROBIOLOGY DEPARTMENT WITHIN ONE WEEK IF SPECIATION AND SENSITIVITIES ARE REQUIRED. Performed at [facility], [place] [address], [place], [place] [address]    Report Status 12/29/2018 FINAL  Final  Blood Culture ID Panel (Reflexed)     Status: Abnormal   Collection Time: 12/20/2018  8:55 PM  Result Value Ref Range Status   Enterococcus species NOT DETECTED NOT DETECTED Final   Listeria monocytogenes NOT DETECTED NOT DETECTED Final   Staphylococcus species DETECTED (A) NOT DETECTED Final    Comment: Methicillin (oxacillin) resistant coagulase negative staphylococcus. Possible blood culture contaminant (unless isolated from more than one blood culture draw or clinical case suggests pathogenicity). No antibiotic treatment is indicated for blood  culture contaminants.    Staphylococcus aureus (BCID) NOT DETECTED NOT DETECTED Final   Methicillin resistance DETECTED (A) NOT DETECTED Final    Comment: CRITICAL RESULT CALLED TO, READ BACK BY AND VERIFIED WITH: J ORIET PHARMD 12/28/18 0013 JDW    Streptococcus species NOT DETECTED NOT DETECTED Final   Streptococcus agalactiae NOT DETECTED NOT DETECTED Final   Streptococcus pneumoniae NOT DETECTED NOT DETECTED Final   Streptococcus pyogenes NOT DETECTED NOT  DETECTED Final   Acinetobacter baumannii NOT DETECTED NOT DETECTED Final   Enterobacteriaceae species NOT DETECTED NOT DETECTED Final   Enterobacter cloacae complex NOT DETECTED NOT DETECTED Final   Escherichia coli NOT DETECTED NOT DETECTED Final   Klebsiella oxytoca NOT DETECTED NOT DETECTED Final   Klebsiella pneumoniae NOT DETECTED NOT DETECTED Final   Proteus species NOT DETECTED NOT DETECTED Final   Serratia marcescens NOT DETECTED NOT DETECTED Final   Haemophilus influenzae NOT DETECTED NOT DETECTED Final   Neisseria meningitidis NOT DETECTED NOT DETECTED Final   Pseudomonas aeruginosa NOT DETECTED NOT DETECTED Final   Candida albicans NOT DETECTED NOT DETECTED Final   Candida glabrata NOT DETECTED NOT DETECTED Final   Candida krusei NOT DETECTED NOT DETECTED Final   Candida parapsilosis NOT DETECTED NOT DETECTED Final   Candida tropicalis NOT DETECTED NOT  DETECTED Final    Comment: Performed at [facility], [place] [address], [place], [place] [address]  Blood Culture (routine x 2)     Status: None   Collection Time: 12/22/2018  9:00 PM  Result Value Ref Range Status   Specimen Description BLOOD RIGHT ARM  Final   Special Requests   Final    BOTTLES DRAWN AEROBIC AND ANAEROBIC Blood Culture results may not be optimal due to an excessive volume of blood received in culture bottles   Culture   Final    NO GROWTH 5 DAYS Performed at [facility], [place] [address] [place], [place] [address]    Report Status 12/30/2018 FINAL  Final  Respiratory Panel by PCR     Status: Abnormal   Collection Time: 12/26/18  1:12 AM  Result Value Ref Range Status   Adenovirus NOT DETECTED NOT DETECTED Final   Coronavirus 229E NOT DETECTED NOT DETECTED Final    Comment: (NOTE) The Coronavirus on the Respiratory Panel, DOES NOT test for the novel  Coronavirus (2019 nCoV)    Coronavirus HKU1 DETECTED (A) NOT DETECTED Final   Coronavirus NL63 NOT DETECTED NOT DETECTED Final   Coronavirus  OC43 NOT DETECTED NOT DETECTED Final   Metapneumovirus NOT DETECTED NOT DETECTED Final   Rhinovirus / Enterovirus NOT DETECTED NOT DETECTED Final   Influenza A NOT DETECTED NOT DETECTED Final   Influenza B NOT DETECTED NOT DETECTED Final   Parainfluenza Virus 1 NOT DETECTED NOT DETECTED Final   Parainfluenza Virus 2 NOT DETECTED NOT DETECTED Final   Parainfluenza Virus 3 NOT DETECTED NOT DETECTED Final   Parainfluenza Virus 4 NOT DETECTED NOT DETECTED Final   Respiratory Syncytial Virus NOT DETECTED NOT DETECTED Final   Bordetella pertussis NOT DETECTED NOT DETECTED Final   Chlamydophila pneumoniae NOT DETECTED NOT DETECTED Final   Mycoplasma pneumoniae NOT DETECTED NOT DETECTED Final    Comment: Performed at [facility], [place] [address], [place], [place] [address]  Urine culture     Status: Abnormal   Collection Time: 12/26/18  2:59 AM  Result Value Ref Range Status   Specimen Description URINE, CLEAN CATCH  Final   Special Requests NONE  Final   Culture (A)  Final    <10,000 COLONIES/mL INSIGNIFICANT GROWTH Performed at [facility], [place] [address] [place], [place] [address]    Report Status 12/26/2018 FINAL  Final  MRSA PCR Screening     Status: None   Collection Time: 12/26/18  4:45 PM  Result Value Ref Range Status   MRSA by PCR NEGATIVE NEGATIVE Final    Comment:        The GeneXpert MRSA Assay (FDA approved for NASAL specimens only), is one component of a comprehensive MRSA colonization surveillance program. It is not intended to diagnose MRSA infection nor to guide or monitor treatment for MRSA infections. Performed at [facility], [place] [address] [place], [place] [address]   Culture, blood (routine x 2)     Status: None (Preliminary result)   Collection Time: 12/30/18  9:30 AM  Result Value Ref Range Status   Specimen Description BLOOD RIGHT THUMB  Final   Special Requests   Final    BOTTLES DRAWN AEROBIC ONLY Blood Culture results may not be  optimal due to an inadequate volume of blood received in culture bottles   Culture   Final    NO GROWTH 3 DAYS Performed at [facility], [address], [place], [place] [address]    Report Status PENDING  Incomplete  Culture, blood (routine x 2)     Status: None (Preliminary result)   Collection Time: 12/30/18  4:32 PM  Result Value Ref Range Status   Specimen Description BLOOD SITE NOT SPECIFIED  Final   Special Requests AEROBIC BOTTLE ONLY Blood Culture adequate volume  Final   Culture   Final    NO GROWTH 3 DAYS Performed at [facility], [address], [place], [place] [address]    Report Status PENDING  Incomplete  SARS Coronavirus 2 (CEPHEID - Performed in [place] hospital lab), Hosp Order     Status: None   Collection Time: 01/02/19  9:24 AM  Result Value Ref Range Status   SARS Coronavirus 2 NEGATIVE NEGATIVE Final    Comment: (NOTE) If result is NEGATIVE SARS-CoV-2 target nucleic acids are NOT DETECTED. The SARS-CoV-2 RNA is generally detectable in upper and lower  respiratory specimens during the acute phase of infection. The lowest  concentration of SARS-CoV-2 viral copies this assay can detect is 250  copies / mL. A negative result does not preclude SARS-CoV-2 infection  and should not be used as the sole basis for treatment or other  patient management decisions.  A negative result may occur with  improper specimen collection / handling, submission of specimen other  than nasopharyngeal swab, presence of viral mutation(s) within the  areas targeted by this assay, and inadequate number of viral copies  (<250 copies / mL). A negative result must be combined with clinical  observations, patient history, and epidemiological information. If result is POSITIVE SARS-CoV-2 target nucleic acids are DETECTED. The SARS-CoV-2 RNA is generally detectable in upper and lower  respiratory specimens dur ing the acute phase of infection.  Positive  results are  indicative of active infection with SARS-CoV-2.  Clinical  correlation with patient history and other diagnostic information is  necessary to determine patient infection status.  Positive results do  not rule out bacterial infection or co-infection with other viruses. If result is PRESUMPTIVE POSTIVE SARS-CoV-2 nucleic acids MAY BE PRESENT.   A presumptive positive result was obtained on the submitted specimen  and confirmed on repeat testing.  While 2019 novel coronavirus  (SARS-CoV-2) nucleic acids may be present in the submitted sample  additional confirmatory testing may be necessary for epidemiological  and / or clinical management purposes  to differentiate between  SARS-CoV-2 and other Sarbecovirus currently known to infect humans.  If clinically indicated additional testing with an alternate test  methodology [phone]) is advised. The SARS-CoV-2 RNA is generally  detectable in upper and lower respiratory sp ecimens during the acute  phase of infection. The expected result is Negative. Fact Sheet for Patients:  StrictlyIdeas.no Fact Sheet for Healthcare Providers: BankingDealers.co.za This test is not yet approved or cleared by the [place] FDA and has been authorized for detection and/or diagnosis of SARS-CoV-2 by FDA under an Emergency Use Authorization (EUA).  This EUA will remain in effect (meaning this test can be used) for the duration of the COVID-19 declaration under Section 564(b)(1) of the Act, 21 U.S.C. section 360bbb-3(b)(1), unless the authorization is terminated or revoked sooner. Performed at [facility], [place] [address] [place], [place] [address]          Radiology Studies: Dg Chest Port 1 View  Result Date: 12/31/2018 CLINICAL DATA:  Dyspnea at rest EXAM: PORTABLE CHEST 1 VIEW COMPARISON:  12/21/2018 chest radiograph. FINDINGS: Stable cardiomediastinal silhouette with normal heart size. No  pneumothorax. No left pleural effusion. Chronic blunting of the right costophrenic angle back to 11/26/2018. Hazy upper left lung and right lung base opacity is stable. New left retrocardiac consolidation. IMPRESSION: 1. New left retrocardiac consolidation. Stable hazy upper left lung and right lung base opacity. Findings suggest multilobar pneumonia. 2. Chronic blunting of the right costophrenic angle back to 11/26/2026 radiograph, compatible with small pleural effusion and/or pleural-parenchymal scarring. Electronically Signed   By: Ilona Sorrel M.D.   On: 12/31/2018 15:01   [place] Ekg Site Rite  Result Date: 01/01/2019 If Site Rite image not attached, placement could not be confirmed due to current cardiac rhythm.       Scheduled Meds:  apixaban  5 mg Oral BID   aspirin EC  81 mg Oral Daily   atorvastatin  20 mg Oral q1800   dextromethorphan-guaiFENesin  1 tablet Oral BID   famotidine  20 mg Oral Daily   feeding supplement (PRO-STAT SUGAR FREE 64)  30 mL Oral BID   fluticasone  2 spray Each Nare Daily   insulin aspart  0-20 Units Subcutaneous Q4H   methylPREDNISolone (SOLU-MEDROL) injection  40 mg Intravenous BID   Ensure Max Protein  11 oz Oral Daily   sertraline  50 mg Oral Daily   sodium chloride flush  10-40 mL Intracatheter Q12H   Continuous Infusions:  sodium chloride 75 mL/hr at 01/01/19 1814   piperacillin-tazobactam (ZOSYN)  IV 3.375 g (01/02/19 1330)     LOS: 7 days    Time spent: 35 min    Nicolette Bang, MD [facility]  If 7PM-7AM, please contact night-coverage  01/02/2019, 1:54 PM

## 2019-01-02 NOTE — Discharge Instructions (Addendum)
Chronic Obstructive Pulmonary Disease Chronic obstructive pulmonary disease (COPD) is a long-term (chronic) lung problem. When you have COPD, it is hard for air to get in and out of your lungs. Usually the condition gets worse over time, and your lungs will never return to normal. There are things you can do to keep yourself as healthy as possible.  Your doctor may treat your condition with: ? Medicines. ? Oxygen. ? Lung surgery.  Your doctor may also recommend: ? Rehabilitation. This includes steps to make your body work better. It may involve a team of specialists. ? Quitting smoking, if you smoke. ? Exercise and changes to your diet. ? Comfort measures (palliative care). Follow these instructions at home: Medicines  Take over-the-counter and prescription medicines only as told by your doctor.  Talk to your doctor before taking any cough or allergy medicines. You may need to avoid medicines that cause your lungs to be dry. Lifestyle  If you smoke, stop. Smoking makes the problem worse. If you need help quitting, ask your doctor.  Avoid being around things that make your breathing worse. This may include smoke, chemicals, and fumes.  Stay active, but remember to rest as well.  Learn and use tips on how to relax.  Make sure you get enough sleep. Most adults need at least 7 hours of sleep every night.  Eat healthy foods. Eat smaller meals more often. Rest before meals. Controlled breathing Learn and use tips on how to control your breathing as told by your doctor. Try:  Breathing in (inhaling) through your nose for 1 second. Then, pucker your lips and breath out (exhale) through your lips for 2 seconds.  Putting one hand on your belly (abdomen). Breathe in slowly through your nose for 1 second. Your hand on your belly should move out. Pucker your lips and breathe out slowly through your lips. Your hand on your belly should move in as you breathe out.  Controlled  coughing Learn and use controlled coughing to clear mucus from your lungs. Follow these steps: 1. Lean your head a little forward. 2. Breathe in deeply. 3. Try to hold your breath for 3 seconds. 4. Keep your mouth slightly open while coughing 2 times. 5. Spit any mucus out into a tissue. 6. Rest and do the steps again 1 or 2 times as needed. General instructions  Make sure you get all the shots (vaccines) that your doctor recommends. Ask your doctor about a flu shot and a pneumonia shot.  Use oxygen therapy and pulmonary rehabilitation if told by your doctor. If you need home oxygen therapy, ask your doctor if you should buy a tool to measure your oxygen level (oximeter).  Make a COPD action plan with your doctor. This helps you to know what to do if you feel worse than usual.  Manage any other conditions you have as told by your doctor.  Avoid going outside when it is very hot, cold, or humid.  Avoid people who have a sickness you can catch (contagious).  Keep all follow-up visits as told by your doctor. This is important. Contact a doctor if:  You cough up more mucus than usual.  There is a change in the color or thickness of the mucus.  It is harder to breathe than usual.  Your breathing is faster than usual.  You have trouble sleeping.  You need to use your medicines more often than usual.  You have trouble doing your normal activities such as getting  dressed or walking around the house. Get help right away if:  You have shortness of breath while resting.  You have shortness of breath that stops you from: ? Being able to talk. ? Doing normal activities.  Your chest hurts for longer than 5 minutes.  Your skin color is more blue than usual.  Your pulse oximeter shows that you have low oxygen for longer than 5 minutes.  You have a fever.  You feel too tired to breathe normally. Summary  Chronic obstructive pulmonary disease (COPD) is a long-term lung  problem.  The way your lungs work will never return to normal. Usually the condition gets worse over time. There are things you can do to keep yourself as healthy as possible.  Take over-the-counter and prescription medicines only as told by your doctor.  If you smoke, stop. Smoking makes the problem worse. This information is not intended to replace advice given to you by your health care provider. Make sure you discuss any questions you have with your health care provider. Document Released: 02/02/2008 Document Revised: 09/20/2016 Document Reviewed: 09/20/2016 Elsevier Interactive Patient Education  2019 [employer] Information on my medicine - ELIQUIS (apixaban)  T Why was Eliquis prescribed for you? Eliquis was prescribed for you to reduce the risk of a blood clot forming that can cause a stroke if you have a medical condition called atrial fibrillation (a type of irregular heartbeat).  What do You need to know about Eliquis ? Take your Eliquis TWICE DAILY - one tablet in the morning and one tablet in the evening with or without food. If you have difficulty swallowing the tablet whole please discuss with your pharmacist how to take the medication safely.  Take Eliquis exactly as prescribed by your doctor and DO NOT stop taking Eliquis without talking to the doctor who prescribed the medication.  Stopping may increase your risk of developing a stroke.  Refill your prescription before you run out.  After discharge, you should have regular check-up appointments with your healthcare provider that is prescribing your Eliquis.  In the future your dose may need to be changed if your kidney function or weight changes by a significant amount or as you get older.  What do you do if you miss a dose? If you miss a dose, take it as soon as you remember on the same day and resume taking twice daily.  Do not take more than one dose of ELIQUIS at the same time to make up a missed  dose.  Important Safety Information A possible side effect of Eliquis is bleeding. You should call your healthcare provider right away if you experience any of the following: ? Bleeding from an injury or your nose that does not stop. ? Unusual colored urine (red or dark brown) or unusual colored stools (red or black). ? Unusual bruising for unknown reasons. ? A serious fall or if you hit your head (even if there is no bleeding).  Some medicines may interact with Eliquis and might increase your risk of bleeding or clotting while on Eliquis. To help avoid this, consult your healthcare provider or pharmacist prior to using any new prescription or non-prescription medications, including herbals, vitamins, non-steroidal anti-inflammatory drugs (NSAIDs) and supplements.  This website has more information on Eliquis (apixaban): http://www.eliquis.com/eliquis/home

## 2019-01-02 NOTE — Plan of Care (Signed)
  Problem: Health Behavior/Discharge Planning: Goal: Ability to manage health-related needs will improve Outcome: Not Applicable  Pt from skilled nursing facility with plans to return upon discharge. SNF to manage health related needs.  Problem: Clinical Measurements: Goal: Ability to maintain clinical measurements within normal limits will improve Outcome: Not Progressing  Pt continues to become short of breath with even minimal exertion. Continues to require bipap PRN and 5-8 L HFNC

## 2019-01-03 LAB — CBC WITH DIFFERENTIAL/PLATELET
Abs Immature Granulocytes: 0.04 10*3/uL (ref 0.00–0.07)
Basophils Absolute: 0 10*3/uL (ref 0.0–0.1)
Basophils Relative: 0 %
Eosinophils Absolute: 0 10*3/uL (ref 0.0–0.5)
Eosinophils Relative: 0 %
HCT: 30.1 % — ABNORMAL LOW (ref 39.0–52.0)
Hemoglobin: 8.6 g/dL — ABNORMAL LOW (ref 13.0–17.0)
Immature Granulocytes: 1 %
Lymphocytes Relative: 3 %
Lymphs Abs: 0.3 10*3/uL — ABNORMAL LOW (ref 0.7–4.0)
MCH: 29.3 pg (ref 26.0–34.0)
MCHC: 28.6 g/dL — ABNORMAL LOW (ref 30.0–36.0)
MCV: 102.4 fL — ABNORMAL HIGH (ref 80.0–100.0)
Monocytes Absolute: 0.3 10*3/uL (ref 0.1–1.0)
Monocytes Relative: 4 %
Neutro Abs: 6.8 10*3/uL (ref 1.7–7.7)
Neutrophils Relative %: 92 %
Platelets: 179 10*3/uL (ref 150–400)
RBC: 2.94 MIL/uL — ABNORMAL LOW (ref 4.22–5.81)
RDW: 15.5 % (ref 11.5–15.5)
WBC: 7.4 10*3/uL (ref 4.0–10.5)
nRBC: 0 % (ref 0.0–0.2)

## 2019-01-03 LAB — BASIC METABOLIC PANEL
Anion gap: 5 (ref 5–15)
BUN: 16 mg/dL (ref 8–23)
CO2: 46 mmol/L — ABNORMAL HIGH (ref 22–32)
Calcium: 8.9 mg/dL (ref 8.9–10.3)
Chloride: 90 mmol/L — ABNORMAL LOW (ref 98–111)
Creatinine, Ser: 0.56 mg/dL — ABNORMAL LOW (ref 0.61–1.24)
GFR calc Af Amer: >60 mL/min (ref >60)
GFR calc non Af Amer: >60 mL/min (ref >60)
Glucose, Bld: 135 mg/dL — ABNORMAL HIGH (ref 70–99)
Potassium: 4.8 mmol/L (ref 3.5–5.1)
Sodium: 141 mmol/L (ref 135–145)

## 2019-01-03 LAB — GLUCOSE, CAPILLARY
Glucose-Capillary: 130 mg/dL — ABNORMAL HIGH (ref 70–99)
Glucose-Capillary: 189 mg/dL — ABNORMAL HIGH (ref 70–99)
Glucose-Capillary: 219 mg/dL — ABNORMAL HIGH (ref 70–99)
Glucose-Capillary: 221 mg/dL — ABNORMAL HIGH (ref 70–99)
Glucose-Capillary: 271 mg/dL — ABNORMAL HIGH (ref 70–99)
Glucose-Capillary: 271 mg/dL — ABNORMAL HIGH (ref 70–99)
Glucose-Capillary: 29 mg/dL — CL (ref 70–99)
Glucose-Capillary: 404 mg/dL — ABNORMAL HIGH (ref 70–99)

## 2019-01-03 MED ORDER — IPRATROPIUM-ALBUTEROL 0.5-2.5 (3) MG/3ML IN SOLN
3.0000 mL | Freq: Three times a day (TID) | RESPIRATORY_TRACT | Status: DC
  Administered 2019-01-03 – 2019-01-12 (×26): 3 mL via RESPIRATORY_TRACT
  Filled 2019-01-03 (×26): qty 3

## 2019-01-03 MED ORDER — INSULIN ASPART 100 UNIT/ML ~~LOC~~ SOLN
0.0000 [IU] | SUBCUTANEOUS | Status: DC
  Administered 2019-01-03: 5 [IU] via SUBCUTANEOUS
  Administered 2019-01-03: 3 [IU] via SUBCUTANEOUS
  Administered 2019-01-03: 5 [IU] via SUBCUTANEOUS
  Administered 2019-01-04: 3 [IU] via SUBCUTANEOUS
  Administered 2019-01-04: 05:00:00 1 [IU] via SUBCUTANEOUS
  Administered 2019-01-04 (×3): 2 [IU] via SUBCUTANEOUS
  Administered 2019-01-05: 1 [IU] via SUBCUTANEOUS
  Administered 2019-01-05: 20:00:00 7 [IU] via SUBCUTANEOUS
  Administered 2019-01-05: 12:00:00 3 [IU] via SUBCUTANEOUS
  Administered 2019-01-05: 1 [IU] via SUBCUTANEOUS
  Administered 2019-01-05: 17:00:00 2 [IU] via SUBCUTANEOUS
  Administered 2019-01-06: 12:00:00 5 [IU] via SUBCUTANEOUS
  Administered 2019-01-06: 08:00:00 2 [IU] via SUBCUTANEOUS
  Administered 2019-01-06: 17:00:00 3 [IU] via SUBCUTANEOUS
  Administered 2019-01-06: 1 [IU] via SUBCUTANEOUS
  Administered 2019-01-06: 3 [IU] via SUBCUTANEOUS
  Administered 2019-01-07 (×2): 2 [IU] via SUBCUTANEOUS
  Administered 2019-01-07 (×3): 1 [IU] via SUBCUTANEOUS
  Administered 2019-01-07: 3 [IU] via SUBCUTANEOUS
  Administered 2019-01-08: 17:00:00 1 [IU] via SUBCUTANEOUS
  Administered 2019-01-08: 5 [IU] via SUBCUTANEOUS
  Administered 2019-01-08: 3 [IU] via SUBCUTANEOUS
  Administered 2019-01-08: 1 [IU] via SUBCUTANEOUS
  Administered 2019-01-09: 12:00:00 7 [IU] via SUBCUTANEOUS
  Administered 2019-01-09 – 2019-01-10 (×4): 5 [IU] via SUBCUTANEOUS
  Administered 2019-01-11: 18:00:00 3 [IU] via SUBCUTANEOUS
  Administered 2019-01-11: 2 [IU] via SUBCUTANEOUS
  Administered 2019-01-11: 12:00:00 5 [IU] via SUBCUTANEOUS
  Administered 2019-01-11: 09:00:00 1 [IU] via SUBCUTANEOUS
  Administered 2019-01-12 (×2): 2 [IU] via SUBCUTANEOUS
  Administered 2019-01-12: 1 [IU] via SUBCUTANEOUS

## 2019-01-03 MED ORDER — INSULIN ASPART 100 UNIT/ML ~~LOC~~ SOLN
3.0000 [IU] | Freq: Three times a day (TID) | SUBCUTANEOUS | Status: DC
  Administered 2019-01-03 – 2019-01-12 (×26): 3 [IU] via SUBCUTANEOUS

## 2019-01-03 MED ORDER — PREDNISONE 20 MG PO TABS
40.0000 mg | ORAL_TABLET | Freq: Every day | ORAL | Status: DC
  Administered 2019-01-04 – 2019-01-05 (×2): 40 mg via ORAL
  Filled 2019-01-03 (×2): qty 2

## 2019-01-03 MED ORDER — INSULIN GLARGINE 100 UNIT/ML ~~LOC~~ SOLN
5.0000 [IU] | Freq: Every day | SUBCUTANEOUS | Status: DC
  Administered 2019-01-03 – 2019-01-07 (×5): 5 [IU] via SUBCUTANEOUS
  Filled 2019-01-03 (×6): qty 0.05

## 2019-01-03 MED ORDER — DEXTROSE 50 % IV SOLN
INTRAVENOUS | Status: AC
  Administered 2019-01-03: 05:00:00 25 mL
  Filled 2019-01-03: qty 50

## 2019-01-03 MED ORDER — METHYLPREDNISOLONE SODIUM SUCC 40 MG IJ SOLR
40.0000 mg | Freq: Two times a day (BID) | INTRAMUSCULAR | Status: AC
  Administered 2019-01-03: 21:00:00 40 mg via INTRAVENOUS
  Filled 2019-01-03: qty 1

## 2019-01-03 NOTE — Progress Notes (Signed)
CBG at 0100  Was 404. Pt had just received IV Solumedrol 40 mg at 0015. Will cover per sliding scale and re-evaluate with next CBG. Dierdre Highman, RN

## 2019-01-03 NOTE — Progress Notes (Signed)
Inpatient Diabetes Program Recommendations  AACE/ADA: New Consensus Statement on Inpatient Glycemic Control (2015)  Target Ranges:  Prepandial:   less than 140 mg/dL      Peak postprandial:   less than 180 mg/dL (1-2 hours)      Critically ill patients:  140 - 180 mg/dL   Lab Results  Component Value Date   GLUCAP 271 (H) 01/03/2019   HGBA1C 7.6 (H) 12/26/2018     Results for Gregory Stone, Gregory Stone (MRN [id number]) as of 01/03/2019 11:49  Ref. Range 01/02/2019 15:30 01/02/2019 20:42 01/03/2019 00:53 01/03/2019 04:44 01/03/2019 06:36 01/03/2019 07:37 01/03/2019 10:50  Glucose-Capillary Latest Ref Range: 70 - 99 mg/dL 938 (H)  Novolog 11 units  170 (H)  Novolog 4 units 404 (H)  Novolog 20 units 29 (LL) 130 (H) 189 (H)  Novolog 4 units 271 (H)     Diabetes history: DM 2  Outpatient Diabetes medications:  Basaglar 30 units q AM and 10 units q PM  Current inpatient DM meds:  Novolog (0-20 units) Q 4 hours  Solumedrol 40mg  BID   Patient's CBG has fluctuated due to steroids. Last lantus 10 units dose was 5/4 @ 0908 (fasting CBG had been low therefore it was discontinued). Patient is on the Novolog resistant scale Q 4hours and the higher amounts of Novolog dropped patient's blood sugar overnight from 404mg /dl to 29 mg/dl after 20 units of Novolog insulin were given.   Suggest restarting reduced amount  of basal insulin. Decreasing Novolog correction to Sensitive. Suggest leaving Q4 correction scale at this point due to steroids.  Would suggest to also add Novolog meal coverage.    As steroids are decreased the need for insulin will likely be reduced.   (Noticed lunch CBG of 271 and just spoke to bedside RN as he should get per current resistant scale 11 units but I am concerned that will again drop his CBG too much. Informed RN I am making recommendation and will touch base with MD and call RN back.)   MD please consider the following insulin adjustment:  1. Add Lantus 5 units daily (starting  now)  2. Decrease Novolog correction to SENSITIVE (0-9 units) Q4 hours  3. Add Novolog 3 units meal coverage tid (Please add the following hold parameters:  Hold if NPO or if patient eats less than 50% of meal.)   -- Will follow during hospitalization.--  Jamelle Rushing RN, MSN Diabetes Coordinator Inpatient Glycemic Control Team Team Pager: [phone] (8am-5pm)

## 2019-01-03 NOTE — Progress Notes (Signed)
Came to assess patient, patient is resting comfortably at this time SATs are stable at 100% on a 7l HFNC. No distress or complications noted.

## 2019-01-03 NOTE — Care Management Important Message (Signed)
Important Message  Patient Details  Name: Gregory Stone MRN: [id number] Date of Birth: 1946-06-12   Medicare Important Message Given:  Yes    [name] 01/03/2019, 12:49 PM

## 2019-01-03 NOTE — Progress Notes (Signed)
Physical Therapy Treatment Patient Details Name: Gregory Stone MRN: [id number] DOB: Jan 01, 1946 Today's Date: 01/03/2019    History of Present Illness 73 yo admitted from [facility] with AMS and SOB with HCAP covid (-). PMhx: COPD on home O2, HLD, DM, Afib, depression    PT Comments    Today's skilled session focused on strengthening and activity progression. Pt did desaturate at edge of bed with mobility. 2-3 minutes of deep breathing once returned to supine to recover SaO2 from 85% to >/=90%. Pt on 5 lpm via [place] with session, increased briefly to 6 lpm to assist with recovery and back to 5 lpm once recovered.  Acute PT to continue during pt's hospital stay.    Follow Up Recommendations  SNF;Supervision/Assistance - 24 hour     Equipment Recommendations  None recommended by PT    Recommendations for Other Services OT consult     Precautions / Restrictions Precautions Precautions: Fall Precaution Comments: home O2 3L Restrictions Weight Bearing Restrictions: No    Mobility  Bed Mobility Overal bed mobility: Needs Assistance       Supine to sit: Min guard Sit to supine: Min guard   General bed mobility comments: with HOB elevated 40 degrees- pt able to bring self to edge of bed and return to supine with assist to manage lines only. At edge of bed pt able to self scoot hips toward head of bed with supervision x 3 scoots.       Balance Overall balance assessment: Needs assistance Sitting-balance support: Feet supported   Sitting balance - Comments: seated edge of bed with intermittent UE support. supervision assist with cues on posture. worked on upright posture and deep breathing for O2 sat recovery. EOB 8-10 minutes total.             Cognition Arousal/Alertness: Awake/alert   Overall Cognitive Status: History of cognitive impairments - at baseline Area of Impairment: Safety/judgement;Problem solving;Orientation       Orientation Level: Disoriented  to;Time Current Attention Level: Sustained Memory: Decreased short-term memory Following Commands: Follows one step commands consistently;Follows multi-step commands with increased time Safety/Judgement: Decreased awareness of safety   Problem Solving: Decreased initiation;Requires verbal cues General Comments: pt able to follow commands, needs increased time for processing and initiation. able to perform more without assist when given time. oriented to person, place and situation today.       Exercises General Exercises - Lower Extremity Ankle Circles/Pumps: AROM;Strengthening;Both;10 reps;Supine Quad Sets: AROM;Strengthening;Both;10 reps;Supine Short Arc Quad: AAROM;Strengthening;Both;10 reps;Supine Straight Leg Raises: AAROM;Strengthening;Both;10 reps;Supine    General Comments        Pertinent Vitals/Pain Pain Assessment: No/denies pain     PT Goals (current goals can now be found in the care plan section) Acute Rehab PT Goals Patient Stated Goal: feel better PT Goal Formulation: With patient Time For Goal Achievement: 01/12/19 Potential to Achieve Goals: Fair Progress towards PT goals: Progressing toward goals    Frequency    Min 2X/week      PT Plan Current plan remains appropriate       AM-PAC PT "6 Clicks" Mobility   Outcome Measure  Help needed turning from your back to your side while in a flat bed without using bedrails?: A Little Help needed moving from lying on your back to sitting on the side of a flat bed without using bedrails?: A Little Help needed moving to and from a bed to a chair (including a wheelchair)?: A Little Help needed standing up from a  chair using your arms (e.g., wheelchair or bedside chair)?: A Little Help needed to walk in hospital room?: A Lot Help needed climbing 3-5 steps with a railing? : Total 6 Click Score: 15    End of Session Equipment Utilized During Treatment: Gait belt;Oxygen Activity Tolerance: Patient limited by  fatigue;Treatment limited secondary to medical complications (Comment)(decr'd O2 sats at edge of bed) Patient left: in bed;with call bell/phone within reach;with bed alarm set Nurse Communication: Mobility status PT Visit Diagnosis: Repeated falls (R29.6);Muscle weakness (generalized) (M62.81)     Time: [id number] PT Time Calculation (min) (ACUTE ONLY): 27 min  Charges:  $Therapeutic Exercise: 8-22 mins $Therapeutic Activity: 8-22 mins           Sallyanne KusterKathy Bury, PTA, [facility] Office- [phone] 01/03/19, 12:17 PM  Sallyanne KusterBury, Kathy 01/03/2019, 12:13 PM

## 2019-01-03 NOTE — Progress Notes (Addendum)
[facility]                                                                              Patient Demographics  Gregory Stone, is a 73 y.o. male, DOB - March 03, 1946, ZOX:[id number]  Admit date - 01-19-19   Admitting Physician Lorretta Harp, MD  Outpatient Primary MD for the patient is Patient, No Pcp Per  Outpatient specialists:   LOS - 8  days   Medical records reviewed and are as summarized below:    Chief Complaint  Patient presents with  . 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  Per report, he had well shortness of breath in the facility, found to have O2 sats in 50s.  He was given breathing treatments and had also been treated with a dose of Ativan then patient became minimally responsive.  In ED he was barely arousable.  In ED patient was found to have negative COVID test, negative flu test WBC is 12.3, lactic acid 4.9, troponin  0.03, UA suggestive of UTI.  A. fib with RVR, O2 sats 100% on 5 L. ABG showed pH of 7.18, PCO2 103, PO2 142.  Chest x-ray showed left upper lobe infiltrates.  CT head negative.   Assessment & Plan    Principal Problem:   HCAP (healthcare-associated pneumonia)/sepsis with COPD exacerbation, acute hypercarbic and hypoxic respiratory failure -Patient had been progressively improving, until he was scheduled for discharge on Sunday, 5/3.  Patient however became hypoxic later in the afternoon and chest x-ray showed new pneumonia. -Patient was placed on BiPAP and high flow nasal cannula - repeat chest x-ray showed multifocal pneumonia, COVID test repeated was negative on 5/5 -RVP panel showed positive coronavirus H K[facility] 1 -Urine strep antigen, Legionella antigen negative -Patient was placed on IV  Zosyn, Solu-Medrol -Taper IV Solu-Medrol, changed to oral prednisone in a.m. Wean O2 as tolerated, at baseline on 3 L -Overnight required BiPAP.  Hopefully DC back to skilled nursing facility once off the BiPAP for 24 hours and hopefully wean O2 down to baseline  Active Problems: Acute metabolic encephalopathy -Likely due to hypercarbic and hypoxic respiratory failure, now improving  Atrial fibrillation with RVR Most likely due to sepsis, #1 Currently normal sinus rhythm Continue Eliquis CHADVASC 2  Elevated troponin -Likely due to demand ischemia due to #1 -Currently no chest pain.  EKG showed sinus tachycardia no acute ST-T wave changes suggestive of ACS  Diabetes mellitus type 2 uncontrolled with hyperglycemia - hypoglycemia overnight with blood sugar up to 29 -Hemoglobin A1c 7.6 -Restarted Lantus 5 units daily at bedtime, NovoLog meal coverage 3 units 3 times daily AC, sliding scale insulin sensitive  Lactic acidosis Resolved  Hyperlipidemia Continue Lipitor  History of depression and anxiety Hold Ativan.  Continue Zoloft  Abnormal LFTs Hepatitis panel negative  Code Status: DNR DVT Prophylaxis: Apixaban Family Communication: Discussed in detail with the patient, all imaging results, lab results explained to the  patient   Disposition Plan: High risk of decompensation, required BiPAP overnight.  Still on 5 L O2, weaning down hopefully to baseline  Time Spent in minutes 35 minutes  Procedures:  BiPAP  Consultants:   None  Antimicrobials:   Anti-infectives (From admission, onward)   Start     Dose/Rate Route Frequency Ordered Stop   12/31/18 1600  piperacillin-tazobactam (ZOSYN) IVPB 3.375 g     3.375 g 12.5 mL/hr over 240 Minutes Intravenous Every 8 hours 12/31/18 1525     12/31/18 0000  cefdinir (OMNICEF) 300 MG capsule     300 mg Oral 2 times daily 12/31/18 1041 01/02/19 2359   12/31/18 0000  azithromycin (ZITHROMAX) 500 MG tablet     500 mg Oral Daily  12/31/18 1041     12/26/18 2200  vancomycin (VANCOCIN) IVPB 1000 mg/200 mL premix  Status:  Discontinued     1,000 mg 200 mL/hr over 60 Minutes Intravenous Every 24 hours 12/26/18 0027 12/26/18 1301   12/26/18 1430  cefTRIAXone (ROCEPHIN) 2 g in sodium chloride 0.9 % 100 mL IVPB  Status:  Discontinued     2 g 200 mL/hr over 30 Minutes Intravenous Every 24 hours 12/26/18 1301 12/31/18 1525   12/26/18 0600  piperacillin-tazobactam (ZOSYN) IVPB 3.375 g  Status:  Discontinued     3.375 g 12.5 mL/hr over 240 Minutes Intravenous Every 8 hours 12/26/18 0027 12/26/18 1301   12/26/18 0030  azithromycin (ZITHROMAX) 500 mg in sodium chloride 0.9 % 250 mL IVPB  Status:  Discontinued     500 mg 250 mL/hr over 60 Minutes Intravenous Daily at bedtime 12/26/18 0013 12/31/18 1530   12/20/2018 2030  vancomycin (VANCOCIN) IVPB 1000 mg/200 mL premix     1,000 mg 200 mL/hr over 60 Minutes Intravenous  Once 12/06/2018 2029 11/29/2018 2208   12/11/2018 2030  piperacillin-tazobactam (ZOSYN) IVPB 3.375 g     3.375 g 100 mL/hr over 30 Minutes Intravenous  Once 12/12/2018 2029 12/24/2018 2135          Medications  Scheduled Meds: . apixaban  5 mg Oral BID  . aspirin EC  81 mg Oral Daily  . atorvastatin  20 mg Oral q1800  . dextromethorphan-guaiFENesin  1 tablet Oral BID  . famotidine  20 mg Oral Daily  . feeding supplement (PRO-STAT SUGAR FREE 64)  30 mL Oral BID  . fluticasone  2 spray Each Nare Daily  . insulin aspart  0-9 Units Subcutaneous Q4H  . insulin aspart  3 Units Subcutaneous TID WC  . insulin glargine  5 Units Subcutaneous QHS  . ipratropium-albuterol  3 mL Nebulization TID  . methylPREDNISolone (SOLU-MEDROL) injection  40 mg Intravenous BID  . Ensure Max Protein  11 oz Oral Daily  . sertraline  50 mg Oral Daily  . sodium chloride flush  10-40 mL Intracatheter Q12H   Continuous Infusions: . sodium chloride 75 mL/hr at 01/02/19 1651  . piperacillin-tazobactam (ZOSYN)  IV 3.375 g (01/03/19 1401)    PRN Meds:.albuterol, hydrALAZINE, ipratropium-albuterol, ondansetron (ZOFRAN) IV, sodium chloride, sodium chloride flush      Subjective:   Faolan Springfield was seen and examined today.  Currently denies any complaints.  Overnight required BiPAP, feeling tired today.  Still on 5 L O2 via nasal cannula, sats in high 90s, weaned to 4 L during encounter.  Patient denies dizziness, chest pain, abdominal pain, N/V/D/C, new weakness, numbess, tingling.   Objective:   Vitals:   01/02/19 1933 01/02/19 2129 01/03/19 0448  01/03/19 1436  BP: (!) 148/78  (!) 190/95   Pulse: 82 89 80   Resp: (!) 24 (!) 28 18   Temp: 98 F (36.7 C)  97.9 F (36.6 C)   TempSrc: Oral  Oral   SpO2: 97% 95% 100% 94%  Weight:   70.2 kg     Intake/Output Summary (Last 24 hours) at 01/03/2019 1506 Last data filed at 01/03/2019 1055 Gross per 24 hour  Intake 980.4 ml  Output 1700 ml  Net -719.6 ml     Wt Readings from Last 3 Encounters:  01/03/19 70.2 kg     Exam  General: Alert and oriented x 3, NAD  Eyes:   HEENT:  Atraumatic, normocephalic, normal oropharynx  Cardiovascular: S1 S2 auscultated, Regular rate and rhythm.  Respiratory: Bibasilar rhonchi  Gastrointestinal: Soft, nontender, nondistended, + bowel sounds  Ext: no pedal edema bilaterally  Neuro: No new deficits  Musculoskeletal: No digital cyanosis, clubbing  Skin: No rashes  Psych: Normal affect and demeanor, alert and oriented x3    Data Reviewed:  I have personally reviewed following labs and imaging studies  Micro Results Recent Results (from the past 240 hour(s))  SARS Coronavirus 2 [facility](Hospital order, Performed in [facility])     Status: None   Collection Time: 2019-03-17  8:45 PM  Result Value Ref Range Status   SARS Coronavirus 2 NEGATIVE NEGATIVE Final    Comment: (NOTE) If result is NEGATIVE SARS-CoV-2 target nucleic acids are NOT DETECTED. The SARS-CoV-2 RNA is generally detectable in upper and lower   respiratory specimens during the acute phase of infection. The lowest  concentration of SARS-CoV-2 viral copies this assay can detect is 250  copies / mL. A negative result does not preclude SARS-CoV-2 infection  and should not be used as the sole basis for treatment or other  patient management decisions.  A negative result may occur with  improper specimen collection / handling, submission of specimen other  than nasopharyngeal swab, presence of viral mutation(s) within the  areas targeted by this assay, and inadequate number of viral copies  (<250 copies / mL). A negative result must be combined with clinical  observations, patient history, and epidemiological information. If result is POSITIVE SARS-CoV-2 target nucleic acids are DETECTED. The SARS-CoV-2 RNA is generally detectable in upper and lower  respiratory specimens dur ing the acute phase of infection.  Positive  results are indicative of active infection with SARS-CoV-2.  Clinical  correlation with patient history and other diagnostic information is  necessary to determine patient infection status.  Positive results do  not rule out bacterial infection or co-infection with other viruses. If result is PRESUMPTIVE POSTIVE SARS-CoV-2 nucleic acids MAY BE PRESENT.   A presumptive positive result was obtained on the submitted specimen  and confirmed on repeat testing.  While 2019 novel coronavirus  (SARS-CoV-2) nucleic acids may be present in the submitted sample  additional confirmatory testing may be necessary for epidemiological  and / or clinical management purposes  to differentiate between  SARS-CoV-2 and other Sarbecovirus currently known to infect humans.  If clinically indicated additional testing with an alternate test  methodology [id number]) is advised. The SARS-CoV-2 RNA is generally  detectable in upper and lower respiratory sp ecimens during the acute  phase of infection. The expected result is Negative. Fact  Sheet for Patients:  BoilerBrush.com.cyhttps://www.fda.gov/media/136312/download Fact Sheet for Healthcare Providers: https://pope.com/https://www.fda.gov/media/136313/download This test is not yet approved or cleared by the [place] FDA and has  been authorized for detection and/or diagnosis of SARS-CoV-2 by FDA under an Emergency Use Authorization (EUA).  This EUA will remain in effect (meaning this test can be used) for the duration of the COVID-19 declaration under Section 564(b)(1) of the Act, 21 U.S.C. section 360bbb-3(b)(1), unless the authorization is terminated or revoked sooner. Performed at [facility], [address] [place], [place] [address]   Blood Culture (routine x 2)     Status: Abnormal   Collection Time: 12/06/2018  8:55 PM  Result Value Ref Range Status   Specimen Description BLOOD LEFT ARM  Final   Special Requests   Final    BOTTLES DRAWN AEROBIC AND ANAEROBIC Blood Culture results may not be optimal due to an inadequate volume of blood received in culture bottles   Culture  Setup Time   Final    GRAM POSITIVE COCCI AEROBIC BOTTLE ONLY CRITICAL RESULT CALLED TO, READ BACK BY AND VERIFIED WITH: J ORIET PHARMD 12/28/18 0013 JDW    Culture (A)  Final    STAPHYLOCOCCUS SPECIES (COAGULASE NEGATIVE) THE SIGNIFICANCE OF ISOLATING THIS ORGANISM FROM A SINGLE SET OF BLOOD CULTURES WHEN MULTIPLE SETS ARE DRAWN IS UNCERTAIN. PLEASE NOTIFY THE MICROBIOLOGY DEPARTMENT WITHIN ONE WEEK IF SPECIATION AND SENSITIVITIES ARE REQUIRED. Performed at [facility], [address], [place], [place] [address]    Report Status 12/29/2018 FINAL  Final  Blood Culture ID Panel (Reflexed)     Status: Abnormal   Collection Time: 12/13/2018  8:55 PM  Result Value Ref Range Status   Enterococcus species NOT DETECTED NOT DETECTED Final   Listeria monocytogenes NOT DETECTED NOT DETECTED Final   Staphylococcus species DETECTED (A) NOT DETECTED Final    Comment: Methicillin (oxacillin) resistant coagulase negative  staphylococcus. Possible blood culture contaminant (unless isolated from more than one blood culture draw or clinical case suggests pathogenicity). No antibiotic treatment is indicated for blood  culture contaminants.    Staphylococcus aureus (BCID) NOT DETECTED NOT DETECTED Final   Methicillin resistance DETECTED (A) NOT DETECTED Final    Comment: CRITICAL RESULT CALLED TO, READ BACK BY AND VERIFIED WITH: J ORIET PHARMD 12/28/18 0013 JDW    Streptococcus species NOT DETECTED NOT DETECTED Final   Streptococcus agalactiae NOT DETECTED NOT DETECTED Final   Streptococcus pneumoniae NOT DETECTED NOT DETECTED Final   Streptococcus pyogenes NOT DETECTED NOT DETECTED Final   Acinetobacter baumannii NOT DETECTED NOT DETECTED Final   Enterobacteriaceae species NOT DETECTED NOT DETECTED Final   Enterobacter cloacae complex NOT DETECTED NOT DETECTED Final   Escherichia coli NOT DETECTED NOT DETECTED Final   Klebsiella oxytoca NOT DETECTED NOT DETECTED Final   Klebsiella pneumoniae NOT DETECTED NOT DETECTED Final   Proteus species NOT DETECTED NOT DETECTED Final   Serratia marcescens NOT DETECTED NOT DETECTED Final   Haemophilus influenzae NOT DETECTED NOT DETECTED Final   Neisseria meningitidis NOT DETECTED NOT DETECTED Final   Pseudomonas aeruginosa NOT DETECTED NOT DETECTED Final   Candida albicans NOT DETECTED NOT DETECTED Final   Candida glabrata NOT DETECTED NOT DETECTED Final   Candida krusei NOT DETECTED NOT DETECTED Final   Candida parapsilosis NOT DETECTED NOT DETECTED Final   Candida tropicalis NOT DETECTED NOT DETECTED Final    Comment: Performed at [facility], [address] [place], [place] [address]  Blood Culture (routine x 2)     Status: None   Collection Time: 12/06/2018  9:00 PM  Result Value Ref Range Status   Specimen  Description BLOOD RIGHT ARM  Final   Special Requests   Final    BOTTLES DRAWN AEROBIC AND ANAEROBIC Blood Culture results may not be optimal due to an  excessive volume of blood received in culture bottles   Culture   Final    NO GROWTH 5 DAYS Performed at [facility], [address], [place], [place] [address]    Report Status 12/30/2018 FINAL  Final  Respiratory Panel by PCR     Status: Abnormal   Collection Time: 12/26/18  1:12 AM  Result Value Ref Range Status   Adenovirus NOT DETECTED NOT DETECTED Final   Coronavirus 229E NOT DETECTED NOT DETECTED Final    Comment: (NOTE) The Coronavirus on the Respiratory Panel, DOES NOT test for the novel  Coronavirus (2019 nCoV)    Coronavirus HKU1 DETECTED (A) NOT DETECTED Final   Coronavirus NL63 NOT DETECTED NOT DETECTED Final   Coronavirus OC43 NOT DETECTED NOT DETECTED Final   Metapneumovirus NOT DETECTED NOT DETECTED Final   Rhinovirus / Enterovirus NOT DETECTED NOT DETECTED Final   Influenza A NOT DETECTED NOT DETECTED Final   Influenza B NOT DETECTED NOT DETECTED Final   Parainfluenza Virus 1 NOT DETECTED NOT DETECTED Final   Parainfluenza Virus 2 NOT DETECTED NOT DETECTED Final   Parainfluenza Virus 3 NOT DETECTED NOT DETECTED Final   Parainfluenza Virus 4 NOT DETECTED NOT DETECTED Final   Respiratory Syncytial Virus NOT DETECTED NOT DETECTED Final   Bordetella pertussis NOT DETECTED NOT DETECTED Final   Chlamydophila pneumoniae NOT DETECTED NOT DETECTED Final   Mycoplasma pneumoniae NOT DETECTED NOT DETECTED Final    Comment: Performed at [facility], [address] [place], [place] [address]  Urine culture     Status: Abnormal   Collection Time: 12/26/18  2:59 AM  Result Value Ref Range Status   Specimen Description URINE, CLEAN CATCH  Final   Special Requests NONE  Final   Culture (A)  Final    <10,000 COLONIES/mL INSIGNIFICANT GROWTH Performed at [facility], [address] [place], [place] [address]    Report Status 12/26/2018 FINAL  Final  MRSA PCR Screening     Status: None   Collection Time: 12/26/18  4:45 PM  Result Value Ref Range Status    MRSA by PCR NEGATIVE NEGATIVE Final    Comment:        The GeneXpert MRSA Assay (FDA approved for NASAL specimens only), is one component of a comprehensive MRSA colonization surveillance program. It is not intended to diagnose MRSA infection nor to guide or monitor treatment for MRSA infections. Performed at [facility], [address] [place], [place] [address]   Culture, blood (routine x 2)     Status: None (Preliminary result)   Collection Time: 12/30/18  9:30 AM  Result Value Ref Range Status   Specimen Description BLOOD RIGHT THUMB  Final   Special Requests   Final    BOTTLES DRAWN AEROBIC ONLY Blood Culture results may not be optimal due to an inadequate volume of blood received in culture bottles   Culture   Final    NO GROWTH 4 DAYS Performed at [facility], [address] [place], [place] [address]    Report Status PENDING  Incomplete  Culture, blood (routine x 2)     Status: None (Preliminary result)   Collection Time: 12/30/18  4:32 PM  Result Value Ref Range Status   Specimen Description  BLOOD SITE NOT SPECIFIED  Final   Special Requests AEROBIC BOTTLE ONLY Blood Culture adequate volume  Final   Culture   Final    NO GROWTH 4 DAYS Performed at [facility], [address] [place], [place] [address]    Report Status PENDING  Incomplete  Respiratory Panel by PCR     Status: None   Collection Time: 01/02/19  9:24 AM  Result Value Ref Range Status   Adenovirus NOT DETECTED NOT DETECTED Final   Coronavirus 229E NOT DETECTED NOT DETECTED Final    Comment: (NOTE) The Coronavirus on the Respiratory Panel, DOES NOT test for the novel  Coronavirus (2019 nCoV)    Coronavirus HKU1 NOT DETECTED NOT DETECTED Final   Coronavirus NL63 NOT DETECTED NOT DETECTED Final   Coronavirus OC43 NOT DETECTED NOT DETECTED Final   Metapneumovirus NOT DETECTED NOT DETECTED Final   Rhinovirus / Enterovirus NOT DETECTED NOT DETECTED Final   Influenza A NOT DETECTED  NOT DETECTED Final   Influenza B NOT DETECTED NOT DETECTED Final   Parainfluenza Virus 1 NOT DETECTED NOT DETECTED Final   Parainfluenza Virus 2 NOT DETECTED NOT DETECTED Final   Parainfluenza Virus 3 NOT DETECTED NOT DETECTED Final   Parainfluenza Virus 4 NOT DETECTED NOT DETECTED Final   Respiratory Syncytial Virus NOT DETECTED NOT DETECTED Final   Bordetella pertussis NOT DETECTED NOT DETECTED Final   Chlamydophila pneumoniae NOT DETECTED NOT DETECTED Final   Mycoplasma pneumoniae NOT DETECTED NOT DETECTED Final    Comment: Performed at [facility], [address] [place], [place] [address]  SARS Coronavirus 2 (CEPHEID - Performed in [facility] hospital lab), Hosp Order     Status: None   Collection Time: 01/02/19  9:24 AM  Result Value Ref Range Status   SARS Coronavirus 2 NEGATIVE NEGATIVE Final    Comment: (NOTE) If result is NEGATIVE SARS-CoV-2 target nucleic acids are NOT DETECTED. The SARS-CoV-2 RNA is generally detectable in upper and lower  respiratory specimens during the acute phase of infection. The lowest  concentration of SARS-CoV-2 viral copies this assay can detect is 250  copies / mL. A negative result does not preclude SARS-CoV-2 infection  and should not be used as the sole basis for treatment or other  patient management decisions.  A negative result may occur with  improper specimen collection / handling, submission of specimen other  than nasopharyngeal swab, presence of viral mutation(s) within the  areas targeted by this assay, and inadequate number of viral copies  (<250 copies / mL). A negative result must be combined with clinical  observations, patient history, and epidemiological information. If result is POSITIVE SARS-CoV-2 target nucleic acids are DETECTED. The SARS-CoV-2 RNA is generally detectable in upper and lower  respiratory specimens dur ing the acute phase of infection.  Positive  results are indicative of active infection with  SARS-CoV-2.  Clinical  correlation with patient history and other diagnostic information is  necessary to determine patient infection status.  Positive results do  not rule out bacterial infection or co-infection with other viruses. If result is PRESUMPTIVE POSTIVE SARS-CoV-2 nucleic acids MAY BE PRESENT.   A presumptive positive result was obtained on the submitted specimen  and confirmed on repeat testing.  While 2019 novel coronavirus  (SARS-CoV-2) nucleic acids may be present in the submitted sample  additional confirmatory testing may be necessary for epidemiological  and / or clinical management purposes  to differentiate between  SARS-CoV-2 and other  Sarbecovirus currently known to infect humans.  If clinically indicated additional testing with an alternate test  methodology [phone]) is advised. The SARS-CoV-2 RNA is generally  detectable in upper and lower respiratory sp ecimens during the acute  phase of infection. The expected result is Negative. Fact Sheet for Patients:  BoilerBrush.com.cy Fact Sheet for Healthcare Providers: https://pope.com/ This test is not yet approved or cleared by the [place] FDA and has been authorized for detection and/or diagnosis of SARS-CoV-2 by FDA under an Emergency Use Authorization (EUA).  This EUA will remain in effect (meaning this test can be used) for the duration of the COVID-19 declaration under Section 564(b)(1) of the Act, 21 U.S.C. section 360bbb-3(b)(1), unless the authorization is terminated or revoked sooner. Performed at [facility], [address] [place], [place] [address]     Radiology Reports Ct Head Wo Contrast  Result Date: 12/26/2018 CLINICAL DATA:  73 year old male with ataxia, encephalopathy, atrial fibrillation. EXAM: CT HEAD WITHOUT CONTRAST TECHNIQUE: Contiguous axial images were obtained from the base of the skull through the vertex without intravenous  contrast. COMPARISON:  None. FINDINGS: Brain: Cerebral volume is within normal limits for age. No midline shift, ventriculomegaly, mass effect, evidence of mass lesion, intracranial hemorrhage or evidence of cortically based acute infarction. Patchy periatrial white matter hypodensity. No cortical encephalomalacia. Vascular: Calcified atherosclerosis at the skull base. No suspicious intracranial vascular hyperdensity. Skull: Negative. Sinuses/Orbits: Visualized paranasal sinuses and mastoids are well pneumatized. Other: Negative orbits. Visualized scalp soft tissues are within normal limits. IMPRESSION: 1. No acute intracranial abnormality. 2. Mild for age nonspecific cerebral white matter changes, most commonly due to small vessel disease. Electronically Signed   By: Odessa Fleming M.D.   On: 12/26/2018 01:49   Dg Chest Port 1 View  Result Date: 12/31/2018 CLINICAL DATA:  Dyspnea at rest EXAM: PORTABLE CHEST 1 VIEW COMPARISON:  01-03-19 chest radiograph. FINDINGS: Stable cardiomediastinal silhouette with normal heart size. No pneumothorax. No left pleural effusion. Chronic blunting of the right costophrenic angle back to 11/26/2018. Hazy upper left lung and right lung base opacity is stable. New left retrocardiac consolidation. IMPRESSION: 1. New left retrocardiac consolidation. Stable hazy upper left lung and right lung base opacity. Findings suggest multilobar pneumonia. 2. Chronic blunting of the right costophrenic angle back to 11/26/2026 radiograph, compatible with small pleural effusion and/or pleural-parenchymal scarring. Electronically Signed   By: Delbert Phenix M.D.   On: 12/31/2018 15:01   Dg Chest Port 1 View  Result Date: 01-03-2019 CLINICAL DATA:  73 year old male with sepsis. EXAM: PORTABLE CHEST 1 VIEW COMPARISON:  11/26/2018 and earlier. FINDINGS: Portable AP supine view at 2057 hours. Stable large lung volumes. Stable cardiac size and mediastinal contours. Visualized tracheal air column is within  normal limits. No pneumothorax, pleural effusion or consolidation. New asymmetric reticulonodular opacity in the left upper lung. No acute osseous abnormality identified. IMPRESSION: New left upper lobe reticulonodular pulmonary opacity suspicious for acute viral/atypical respiratory infection. Underlying pulmonary hyperinflation. Electronically Signed   By: Odessa Fleming M.D.   On: 01-03-2019 22:02   [place] Ekg Site Rite  Result Date: 01/01/2019 If Site Rite image not attached, placement could not be confirmed due to current cardiac rhythm.   Lab Data:  CBC: Recent Labs  Lab 12/29/18 1008 12/30/18 0228 12/31/18 0403 01/01/19 0454 01/03/19 0638  WBC 13.1* 9.4 8.7 7.9 7.4  NEUTROABS  --   --   --   --  6.8  HGB 9.2* 8.8* 8.2* 8.5* 8.6*  HCT 30.1* 31.0* 28.6* 30.2* 30.1*  MCV 99.0 100.3* 101.4* 101.0* 102.4*  PLT 210 180 176 185 179   Basic Metabolic Panel: Recent Labs  Lab 12/28/18 0345 12/29/18 0312 12/30/18 0228 12/31/18 0403 01/01/19 0454 01/03/19 0638  NA 139 140 142 142 144 141  K 4.3 4.9 4.8 4.4 4.1 4.8  CL 95* 94* 95* 93* 95* 90*  CO2 38* 38* 42* 40* 44* 46*  GLUCOSE 147* 294* 117* 123* 72 135*  BUN 15 26* 19 18 18 16   CREATININE 0.54* 0.73 0.54* 0.49* 0.47* 0.56*  CALCIUM 9.2 9.4 9.3 9.3 9.2 8.9  MG 1.7 2.1 1.9 1.8 1.8  --    GFR: CrCl cannot be calculated (Unknown ideal weight.). Liver Function Tests: No results for input(s): AST, ALT, ALKPHOS, BILITOT, PROT, ALBUMIN in the last 168 hours. No results for input(s): LIPASE, AMYLASE in the last 168 hours. No results for input(s): AMMONIA in the last 168 hours. Coagulation Profile: No results for input(s): INR, PROTIME in the last 168 hours. Cardiac Enzymes: No results for input(s): CKTOTAL, CKMB, CKMBINDEX, TROPONINI in the last 168 hours. BNP (last 3 results) No results for input(s): PROBNP in the last 8760 hours. HbA1C: No results for input(s): HGBA1C in the last 72 hours. CBG: Recent Labs  Lab 01/03/19 0444  01/03/19 0636 01/03/19 0737 01/03/19 1050 01/03/19 1359  GLUCAP 29* 130* 189* 271* 219*   Lipid Profile: No results for input(s): CHOL, HDL, LDLCALC, TRIG, CHOLHDL, LDLDIRECT in the last 72 hours. Thyroid Function Tests: No results for input(s): TSH, T4TOTAL, FREET4, T3FREE, THYROIDAB in the last 72 hours. Anemia Panel: No results for input(s): VITAMINB12, FOLATE, FERRITIN, TIBC, IRON, RETICCTPCT in the last 72 hours. Urine analysis:    Component Value Date/Time   COLORURINE AMBER (A) 01-17-2019 2036   APPEARANCEUR CLOUDY (A) Jan 17, 2019 2036   LABSPEC 1.020 Jan 17, 2019 2036   PHURINE 5.0 01-17-2019 2036   GLUCOSEU >=500 (A) 01-17-19 2036   HGBUR NEGATIVE 01/17/19 2036   BILIRUBINUR NEGATIVE 17-Jan-2019 2036   KETONESUR NEGATIVE 17-Jan-2019 2036   PROTEINUR 100 (A) 01-17-2019 2036   NITRITE NEGATIVE 01-17-19 2036   LEUKOCYTESUR NEGATIVE 01-17-19 2036     Kabao Leite M.D. [facility] 01/03/2019, 3:06 PM  Pager: [phone] Between 7am to 7pm - call Pager - [phone]  After 7pm go to [email or web address] - password [email or web address]  Call night coverage person covering after 7pm

## 2019-01-03 NOTE — Progress Notes (Signed)
Occupational Therapy Treatment Patient Details Name: Gregory OresJoshua Stone MRN: [id number] DOB: December 06, 1945 Today's Date: 01/03/2019    History of present illness 73 yo admitted from [facility] with AMS and SOB with HCAP covid (-). PMhx: COPD on home O2, HLD, DM, Afib, depression   OT comments  Pt initially pleasant and cooperative; when lines moved to side, pt became agitated stating that I wasn't listening to him and that he didn't want to get up. He also stated that he couldn't feed himself.  R hand was swollen and positioned up. Limited ROM done as NT came in to help him eat, and table was over him.  Arm felt heavy when positioned on pillow--unsure if new weakness vs resisting movement, and edema was not previously documented that I see.  CALLED RN TO CHECK ARM TO MAKE SURE THAT NO CHANGE/CVA.  Pt with limited participation in OT. He did sit up with PT earlier.  Will continue to follow and adjust goals as appropriate.  Follow Up Recommendations  SNF    Equipment Recommendations  None recommended by OT    Recommendations for Other Services      Precautions / Restrictions Precautions Precautions: Fall Precaution Comments: home O2 3L Restrictions Weight Bearing Restrictions: No       Mobility Bed Mobility            General bed mobility comments: pt refused  Transfers                      Balance                                        ADL either performed or assessed with clinical judgement   ADL   Eating/Feeding: Moderate assistance;Maximal assistance;Bed level.  Using LUE                                     General ADL Comments: pt stating he couldn't feed himself; NT reports he self fed breakfast.  Pt using LUE; RUE with edema; heavy--? Resisting vs weakness.     Vision       Perception     Praxis      Cognition Arousal/Alertness: Awake/alert Behavior During Therapy: Agitated Overall Cognitive Status: History of  cognitive impairments - at baseline Area of Impairment: Safety/judgement;Problem solving;Orientation                 Orientation Level: Disoriented to;Time Current Attention Level: Sustained Memory: Decreased short-term memory Following Commands: Follows one step commands consistently;Follows multi-step commands with increased time Safety/Judgement: Decreased awareness of safety   Problem Solving: Decreased initiation;Requires verbal cues General Comments: pt initially pleasant and agreeable to sitting EOB. When OT moved lines to one side and asked him to initiate movement, he got very angry stating that I wasn't listening to him.  Insisted he couldn't self feed, which he did participate in, once he calmed down        Exercises Exercises: Other exercises Other Exercises Other Exercises: AA to AROM L shoulder; did not move RUE much, other than to position it as NT came in to help with self feeding and tray was positioned over him on this side   Shoulder Instructions       General Comments      Pertinent Vitals/ Pain  Pain Assessment: No/denies pain  Home Living                                          Prior Functioning/Environment              Frequency  Min 2X/week        Progress Toward Goals  OT Goals(current goals can now be found in the care plan section)  Progress towards OT goals: Not progressing toward goals - comment  Acute Rehab OT Goals Patient Stated Goal: feel better  Plan      Co-evaluation                 AM-PAC OT "6 Clicks" Daily Activity     Outcome Measure   Help from another person eating meals?: A Lot Help from another person taking care of personal grooming?: A Lot Help from another person toileting, which includes using toliet, bedpan, or urinal?: A Lot Help from another person bathing (including washing, rinsing, drying)?: A Lot Help from another person to put on and taking off regular upper body  clothing?: A Lot Help from another person to put on and taking off regular lower body clothing?: Total 6 Click Score: 11    End of Session    OT Visit Diagnosis: Other abnormalities of gait and mobility (R26.89);Muscle weakness (generalized) (M62.81);Unsteadiness on feet (R26.81)   Activity Tolerance Treatment limited secondary to agitation   Patient Left in bed;with call bell/phone within reach;with bed alarm set   Nurse Communication          Time: 1350-1402 OT Time Calculation (min): 12 min  Charges: OT General Charges $OT Visit: 1 Visit OT Treatments $Therapeutic Activity: 8-22 mins  Marica Otter, OTR/L Acute Rehabilitation Services [phone] [facility] pager [phone] office 01/03/2019   Gregory Stone 01/03/2019, 3:11 PM

## 2019-01-03 NOTE — Progress Notes (Signed)
CBG this am 29. Pt alert and oriented, responding appropriately. Given 1/2 amp D50 and drank entire Glucerna drink. Will re check cbg and continue to monitor. Dierdre Highman, RN

## 2019-01-04 LAB — GLUCOSE, CAPILLARY
Glucose-Capillary: 123 mg/dL — ABNORMAL HIGH (ref 70–99)
Glucose-Capillary: 167 mg/dL — ABNORMAL HIGH (ref 70–99)
Glucose-Capillary: 177 mg/dL — ABNORMAL HIGH (ref 70–99)
Glucose-Capillary: 190 mg/dL — ABNORMAL HIGH (ref 70–99)
Glucose-Capillary: 212 mg/dL — ABNORMAL HIGH (ref 70–99)
Glucose-Capillary: 99 mg/dL (ref 70–99)

## 2019-01-04 LAB — CULTURE, BLOOD (ROUTINE X 2)
Culture: NO GROWTH
Culture: NO GROWTH
Special Requests: ADEQUATE

## 2019-01-04 LAB — BRAIN NATRIURETIC PEPTIDE: B Natriuretic Peptide: 92.6 pg/mL (ref 0.0–100.0)

## 2019-01-04 MED ORDER — FUROSEMIDE 10 MG/ML IJ SOLN
40.0000 mg | Freq: Once | INTRAMUSCULAR | Status: AC
  Administered 2019-01-04: 15:00:00 40 mg via INTRAVENOUS
  Filled 2019-01-04: qty 4

## 2019-01-04 NOTE — Progress Notes (Signed)
Physical Therapy Treatment Patient Details Name: Gregory Stone MRN: [id number] DOB: 1945-09-27 Today's Date: 01/04/2019    History of Present Illness 73 yo admitted from [facility] with AMS and SOB with HCAP covid (-). PMhx: COPD on home O2, HLD, DM, Afib, depression    PT Comments    Today's skilled session continued to work on mobility progression. Pt only agreeable to bed level exercises, nodding head no to edge of bed stating "too tired". Pt did participated in ex's well with assist needed as he fatigued with remaining reps of each one. SaO2 >/= 98% on 5 lpm Paint. Pt left with lunch tray set up and self feeding. NT notified. Acute PT to continue during pt's hospital stay.    Follow Up Recommendations  SNF;Supervision/Assistance - 24 hour     Equipment Recommendations  None recommended by PT    Recommendations for Other Services OT consult     Precautions / Restrictions Precautions Precautions: Fall Precaution Comments: home O2 3L Restrictions Weight Bearing Restrictions: No       Cognition Arousal/Alertness: Awake/alert Behavior During Therapy: Flat affect;WFL for tasks assessed/performed Overall Cognitive Status: History of cognitive impairments - at baseline Area of Impairment: Orientation;Safety/judgement;Awareness          Orientation Level: Disoriented to;Time Current Attention Level: Sustained Memory: Decreased short-term memory Following Commands: Follows one step commands consistently;Follows multi-step commands with increased time Safety/Judgement: Decreased awareness of safety   Problem Solving: Decreased initiation;Requires verbal cues General Comments: pt pleasant, if not soloment with today's session. not very verbal at all today, spoke may 2 words intire session, mostly nodded head to yes/no questions.       Exercises General Exercises - Lower Extremity Ankle Circles/Pumps: AROM;Strengthening;AAROM;Both;10 reps;Supine Quad Sets:  AAROM;Strengthening;Both;10 reps;Supine;AROM Short Arc Quad: AROM;AAROM;Strengthening;Both;10 reps;Supine Heel Slides: AROM;AAROM;Strengthening;Both;10 reps;Supine Straight Leg Raises: AROM;AAROM;Strengthening;Both;10 reps;Supine    General Comments        Pertinent Vitals/Pain Pain Assessment: No/denies pain     PT Goals (current goals can now be found in the care plan section) Acute Rehab PT Goals Patient Stated Goal: feel better PT Goal Formulation: With patient Time For Goal Achievement: 01/12/19 Potential to Achieve Goals: Fair Progress towards PT goals: Progressing toward goals    Frequency    Min 2X/week      PT Plan Current plan remains appropriate    AM-PAC PT "6 Clicks" Mobility   Outcome Measure  Help needed turning from your back to your side while in a flat bed without using bedrails?: A Little Help needed moving from lying on your back to sitting on the side of a flat bed without using bedrails?: A Little Help needed moving to and from a bed to a chair (including a wheelchair)?: A Little Help needed standing up from a chair using your arms (e.g., wheelchair or bedside chair)?: A Little Help needed to walk in hospital room?: A Lot Help needed climbing 3-5 steps with a railing? : Total 6 Click Score: 15    End of Session Equipment Utilized During Treatment: Oxygen Activity Tolerance: Patient tolerated treatment well;Patient limited by fatigue Patient left: in bed;with call bell/phone within reach;with bed alarm set Nurse Communication: Mobility status(NT notified that PTA set up lunch tray for pt and he was eating at this time) PT Visit Diagnosis: Muscle weakness (generalized) (M62.81)     Time: 8127-5170 PT Time Calculation (min) (ACUTE ONLY): 17 min  Charges:  $Therapeutic Exercise: 8-22 mins  Sallyanne KusterKathy Bury, PTA, CLT Acute Rehab Services [phone] 01/04/19, 1:27 PM   Sallyanne KusterBury, Kathy 01/04/2019, 1:25 PM

## 2019-01-04 NOTE — Progress Notes (Addendum)
[facility]                                                                              Patient Demographics  Gregory Stone, is a 73 y.o. male, DOB - 10-Jun-1946, ZOX:[id number]  Admit date - 11/29/2018   Admitting Physician Lorretta Harp, MD  Outpatient Primary MD for the patient is Patient, No Pcp Per  Outpatient specialists:   LOS - 9  days   Medical records reviewed and are as summarized below:    Chief Complaint  Patient presents with  . 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  Per report, he had well shortness of breath in the facility, found to have O2 sats in 50s.  He was given breathing treatments and had also been treated with a dose of Ativan then patient became minimally responsive.  In ED he was barely arousable.  In ED patient was found to have negative COVID test, negative flu test WBC is 12.3, lactic acid 4.9, troponin  0.03, UA suggestive of UTI.  A. fib with RVR, O2 sats 100% on 5 L. ABG showed pH of 7.18, PCO2 103, PO2 142.  Chest x-ray showed left upper lobe infiltrates.  CT head negative.   Assessment & Plan    Principal Problem:   HCAP (healthcare-associated pneumonia)/sepsis with COPD exacerbation, acute hypercarbic and hypoxic respiratory failure -Patient had been progressively improving, until he was scheduled for discharge on Sunday, 5/3.  Patient however became hypoxic later in the afternoon and chest x-ray showed new pneumonia. -Patient was placed on BiPAP and high flow nasal cannula - repeat chest x-ray showed multifocal pneumonia, COVID test repeated was negative on 5/5 -RVP panel showed positive coronavirus H KU 1 -Urine strep antigen, Legionella antigen negative -Patient was placed on IV  Zosyn, Solu-Medrol, transitioned to oral prednisone today -Patient was placed on 7 L high flow overnight, did not need BiPAP.  Difficult to wean.  -I's and O's with almost 6 L positive, stop IV fluids, obtain BNP and give Lasix 40 mg IV x1. -Wean O2 to baseline 3 L as tolerated   Active Problems: Acute metabolic encephalopathy -Likely due to hypercarbic and hypoxic respiratory failure, improving, alert and oriented x3  Atrial fibrillation with RVR Most likely due to sepsis, #1 Currently normal sinus rhythm Continue Eliquis CHADVASC 2  Elevated troponin -Likely due to demand ischemia due to #1 -Currently no chest pain.  EKG showed sinus tachycardia no acute ST-T wave changes suggestive of ACS  Diabetes mellitus type 2 uncontrolled with hyperglycemia - hypoglycemia overnight with blood sugar up to 29 -Hemoglobin A1c 7.6 -CBGs better, continue Lantus 5 units, NovoLog meal coverage and sliding scale insulin sensitive   Lactic acidosis Resolved  Hyperlipidemia Continue Lipitor  History of depression and anxiety Hold Ativan  continue Zoloft  Abnormal LFTs Hepatitis panel negative  Code Status: DNR DVT Prophylaxis: Apixaban Family Communication: Discussed in detail with the patient, all  imaging results, lab results explained to the patient.  Called patient's daughter, unable to make contact.   Disposition Plan: High risk of decompensation, placed on 7 L O2 high flow last night.  Wean down as tolerated, giving Lasix 40 mg IV x1 for likely fluid overload  Time Spent in minutes 25 minutes  Procedures:  BiPAP  Consultants:   None  Antimicrobials:   Anti-infectives (From admission, onward)   Start     Dose/Rate Route Frequency Ordered Stop   12/31/18 1600  piperacillin-tazobactam (ZOSYN) IVPB 3.375 g     3.375 g 12.5 mL/hr over 240 Minutes Intravenous Every 8 hours 12/31/18 1525     12/31/18 0000  cefdinir (OMNICEF) 300 MG capsule  Status:  Discontinued     300 mg  Oral 2 times daily 12/31/18 1041 01/04/19    12/31/18 0000  azithromycin (ZITHROMAX) 500 MG tablet  Status:  Discontinued     500 mg Oral Daily 12/31/18 1041 01/04/19    12/26/18 2200  vancomycin (VANCOCIN) IVPB 1000 mg/200 mL premix  Status:  Discontinued     1,000 mg 200 mL/hr over 60 Minutes Intravenous Every 24 hours 12/26/18 0027 12/26/18 1301   12/26/18 1430  cefTRIAXone (ROCEPHIN) 2 g in sodium chloride 0.9 % 100 mL IVPB  Status:  Discontinued     2 g 200 mL/hr over 30 Minutes Intravenous Every 24 hours 12/26/18 1301 12/31/18 1525   12/26/18 0600  piperacillin-tazobactam (ZOSYN) IVPB 3.375 g  Status:  Discontinued     3.375 g 12.5 mL/hr over 240 Minutes Intravenous Every 8 hours 12/26/18 0027 12/26/18 1301   12/26/18 0030  azithromycin (ZITHROMAX) 500 mg in sodium chloride 0.9 % 250 mL IVPB  Status:  Discontinued     500 mg 250 mL/hr over 60 Minutes Intravenous Daily at bedtime 12/26/18 0013 12/31/18 1530   12/24/2018 2030  vancomycin (VANCOCIN) IVPB 1000 mg/200 mL premix     1,000 mg 200 mL/hr over 60 Minutes Intravenous  Once 12/19/2018 2029 11/29/2018 2208   11/30/2018 2030  piperacillin-tazobactam (ZOSYN) IVPB 3.375 g     3.375 g 100 mL/hr over 30 Minutes Intravenous  Once 12/06/2018 2029 12/12/2018 2135         Medications  Scheduled Meds: . apixaban  5 mg Oral BID  . aspirin EC  81 mg Oral Daily  . atorvastatin  20 mg Oral q1800  . dextromethorphan-guaiFENesin  1 tablet Oral BID  . famotidine  20 mg Oral Daily  . feeding supplement (PRO-STAT SUGAR FREE 64)  30 mL Oral BID  . fluticasone  2 spray Each Nare Daily  . insulin aspart  0-9 Units Subcutaneous Q4H  . insulin aspart  3 Units Subcutaneous TID WC  . insulin glargine  5 Units Subcutaneous QHS  . ipratropium-albuterol  3 mL Nebulization TID  . predniSONE  40 mg Oral QAC breakfast  . Ensure Max Protein  11 oz Oral Daily  . sertraline  50 mg Oral Daily  . sodium chloride flush  10-40 mL Intracatheter Q12H   Continuous  Infusions: . sodium chloride 75 mL/hr at 01/02/19 1651  . piperacillin-tazobactam (ZOSYN)  IV 3.375 g (01/04/19 0506)   PRN Meds:.albuterol, hydrALAZINE, ipratropium-albuterol, ondansetron (ZOFRAN) IV, sodium chloride, sodium chloride flush      Subjective:   Tarik Teixeira was seen and examined today.  Feels very weak and tired.  Overnight did not need BiPAP however was placed on 7 L high flow O2.  During my encounter O2 sats  remained in 99- 100%, decreased O2 to 5 L.  Patient denies dizziness, chest pain, abdominal pain, N/V/D/C, new weakness, numbess, tingling.   Objective:   Vitals:   01/03/19 2010 01/03/19 2253 01/04/19 0400 01/04/19 0851  BP: (!) 163/93  (!) 151/80   Pulse: 84 84 84   Resp: (!) 26 (!) 26 (!) 25   Temp: 98 F (36.7 C)  97.6 F (36.4 C)   TempSrc: Oral  Axillary   SpO2: 98% 100% 100% 100%  Weight:   72 kg    No intake or output data in the 24 hours ending 01/04/19 1352   Wt Readings from Last 3 Encounters:  01/04/19 72 kg    Physical Exam  General: Alert and oriented x 3, NAD  Eyes:  HEENT:  Atraumatic, normocephalic  Cardiovascular: S1 S2 clear, no murmurs, RRR. No pedal edema b/l  Respiratory: Bibasilar rhonchi and rales  Gastrointestinal: Soft, nontender, nondistended, NBS  Ext: no pedal edema bilaterally  Neuro: no new deficits  Musculoskeletal: No cyanosis, clubbing  Skin: No rashes  Psych: Normal affect and demeanor, alert and oriented x3     Data Reviewed:  I have personally reviewed following labs and imaging studies  Micro Results Recent Results (from the past 240 hour(s))  SARS Coronavirus 2 [facility] order, Performed in [facility] hospital lab)     Status: None   Collection Time: Jan 22, 2019  8:45 PM  Result Value Ref Range Status   SARS Coronavirus 2 NEGATIVE NEGATIVE Final    Comment: (NOTE) If result is NEGATIVE SARS-CoV-2 target nucleic acids are NOT DETECTED. The SARS-CoV-2 RNA is generally detectable in upper  and lower  respiratory specimens during the acute phase of infection. The lowest  concentration of SARS-CoV-2 viral copies this assay can detect is 250  copies / mL. A negative result does not preclude SARS-CoV-2 infection  and should not be used as the sole basis for treatment or other  patient management decisions.  A negative result may occur with  improper specimen collection / handling, submission of specimen other  than nasopharyngeal swab, presence of viral mutation(s) within the  areas targeted by this assay, and inadequate number of viral copies  (<250 copies / mL). A negative result must be combined with clinical  observations, patient history, and epidemiological information. If result is POSITIVE SARS-CoV-2 target nucleic acids are DETECTED. The SARS-CoV-2 RNA is generally detectable in upper and lower  respiratory specimens dur ing the acute phase of infection.  Positive  results are indicative of active infection with SARS-CoV-2.  Clinical  correlation with patient history and other diagnostic information is  necessary to determine patient infection status.  Positive results do  not rule out bacterial infection or co-infection with other viruses. If result is PRESUMPTIVE POSTIVE SARS-CoV-2 nucleic acids MAY BE PRESENT.   A presumptive positive result was obtained on the submitted specimen  and confirmed on repeat testing.  While 2019 novel coronavirus  (SARS-CoV-2) nucleic acids may be present in the submitted sample  additional confirmatory testing may be necessary for epidemiological  and / or clinical management purposes  to differentiate between  SARS-CoV-2 and other Sarbecovirus currently known to infect humans.  If clinically indicated additional testing with an alternate test  methodology [phone]) is advised. The SARS-CoV-2 RNA is generally  detectable in upper and lower respiratory sp ecimens during the acute  phase of infection. The expected result is  Negative. Fact Sheet for Patients:  BoilerBrush.com.cy Fact Sheet for Healthcare Providers:  https://pope.com/ This test is not yet approved or cleared by the [place] and has been authorized for detection and/or diagnosis of SARS-CoV-2 by FDA under an Emergency Use Authorization (EUA).  This EUA will remain in effect (meaning this test can be used) for the duration of the COVID-19 declaration under Section 564(b)(1) of the Act, 21 U.S.C. section 360bbb-3(b)(1), unless the authorization is terminated or revoked sooner. Performed at [facility], [address], [place], [place] [address]   Blood Culture (routine x 2)     Status: Abnormal   Collection Time: 12/27/2018  8:55 PM  Result Value Ref Range Status   Specimen Description BLOOD LEFT ARM  Final   Special Requests   Final    BOTTLES DRAWN AEROBIC AND ANAEROBIC Blood Culture results may not be optimal due to an inadequate volume of blood received in culture bottles   Culture  Setup Time   Final    GRAM POSITIVE COCCI AEROBIC BOTTLE ONLY CRITICAL RESULT CALLED TO, READ BACK BY AND VERIFIED WITH: J ORIET PHARMD 12/28/18 0013 JDW    Culture (A)  Final    STAPHYLOCOCCUS SPECIES (COAGULASE NEGATIVE) THE SIGNIFICANCE OF ISOLATING THIS ORGANISM FROM A SINGLE SET OF BLOOD CULTURES WHEN MULTIPLE SETS ARE DRAWN IS UNCERTAIN. PLEASE NOTIFY THE MICROBIOLOGY DEPARTMENT WITHIN ONE WEEK IF SPECIATION AND SENSITIVITIES ARE REQUIRED. Performed at [facility], [address], [place], [place] [address]    Report Status 12/29/2018 FINAL  Final  Blood Culture ID Panel (Reflexed)     Status: Abnormal   Collection Time: 12/07/2018  8:55 PM  Result Value Ref Range Status   Enterococcus species NOT DETECTED NOT DETECTED Final   Listeria monocytogenes NOT DETECTED NOT DETECTED Final   Staphylococcus species DETECTED (A) NOT DETECTED Final    Comment: Methicillin (oxacillin) resistant  coagulase negative staphylococcus. Possible blood culture contaminant (unless isolated from more than one blood culture draw or clinical case suggests pathogenicity). No antibiotic treatment is indicated for blood  culture contaminants.    Staphylococcus aureus (BCID) NOT DETECTED NOT DETECTED Final   Methicillin resistance DETECTED (A) NOT DETECTED Final    Comment: CRITICAL RESULT CALLED TO, READ BACK BY AND VERIFIED WITH: J ORIET PHARMD 12/28/18 0013 JDW    Streptococcus species NOT DETECTED NOT DETECTED Final   Streptococcus agalactiae NOT DETECTED NOT DETECTED Final   Streptococcus pneumoniae NOT DETECTED NOT DETECTED Final   Streptococcus pyogenes NOT DETECTED NOT DETECTED Final   Acinetobacter baumannii NOT DETECTED NOT DETECTED Final   Enterobacteriaceae species NOT DETECTED NOT DETECTED Final   Enterobacter cloacae complex NOT DETECTED NOT DETECTED Final   Escherichia coli NOT DETECTED NOT DETECTED Final   Klebsiella oxytoca NOT DETECTED NOT DETECTED Final   Klebsiella pneumoniae NOT DETECTED NOT DETECTED Final   Proteus species NOT DETECTED NOT DETECTED Final   Serratia marcescens NOT DETECTED NOT DETECTED Final   Haemophilus influenzae NOT DETECTED NOT DETECTED Final   Neisseria meningitidis NOT DETECTED NOT DETECTED Final   Pseudomonas aeruginosa NOT DETECTED NOT DETECTED Final   Candida albicans NOT DETECTED NOT DETECTED Final   Candida glabrata NOT DETECTED NOT DETECTED Final   Candida krusei NOT DETECTED NOT DETECTED Final   Candida parapsilosis NOT DETECTED NOT DETECTED Final   Candida tropicalis NOT DETECTED NOT DETECTED Final    Comment: Performed at [facility], [address] [place], [place] [address]  Blood Culture (routine x 2)     Status: None  Collection Time: Jan 16, 2019  9:00 PM  Result Value Ref Range Status   Specimen Description BLOOD RIGHT ARM  Final   Special Requests   Final    BOTTLES DRAWN AEROBIC AND ANAEROBIC Blood Culture results may not be  optimal due to an excessive volume of blood received in culture bottles   Culture   Final    NO GROWTH 5 DAYS Performed at [facility], [address], [place], [place] [address]    Report Status 12/30/2018 FINAL  Final  Respiratory Panel by PCR     Status: Abnormal   Collection Time: 12/26/18  1:12 AM  Result Value Ref Range Status   Adenovirus NOT DETECTED NOT DETECTED Final   Coronavirus 229E NOT DETECTED NOT DETECTED Final    Comment: (NOTE) The Coronavirus on the Respiratory Panel, DOES NOT test for the novel  Coronavirus (2019 nCoV)    Coronavirus HKU1 DETECTED (A) NOT DETECTED Final   Coronavirus NL63 NOT DETECTED NOT DETECTED Final   Coronavirus OC43 NOT DETECTED NOT DETECTED Final   Metapneumovirus NOT DETECTED NOT DETECTED Final   Rhinovirus / Enterovirus NOT DETECTED NOT DETECTED Final   Influenza A NOT DETECTED NOT DETECTED Final   Influenza B NOT DETECTED NOT DETECTED Final   Parainfluenza Virus 1 NOT DETECTED NOT DETECTED Final   Parainfluenza Virus 2 NOT DETECTED NOT DETECTED Final   Parainfluenza Virus 3 NOT DETECTED NOT DETECTED Final   Parainfluenza Virus 4 NOT DETECTED NOT DETECTED Final   Respiratory Syncytial Virus NOT DETECTED NOT DETECTED Final   Bordetella pertussis NOT DETECTED NOT DETECTED Final   Chlamydophila pneumoniae NOT DETECTED NOT DETECTED Final   Mycoplasma pneumoniae NOT DETECTED NOT DETECTED Final    Comment: Performed at [facility], [address], [place], [place] [address]  Urine culture     Status: Abnormal   Collection Time: 12/26/18  2:59 AM  Result Value Ref Range Status   Specimen Description URINE, CLEAN CATCH  Final   Special Requests NONE  Final   Culture (A)  Final    <10,000 COLONIES/mL INSIGNIFICANT GROWTH Performed at [facility], [address], [place], [place] [address]    Report Status 12/26/2018 FINAL  Final  MRSA PCR Screening     Status: None   Collection Time: 12/26/18  4:45 PM  Result Value  Ref Range Status   MRSA by PCR NEGATIVE NEGATIVE Final    Comment:        The GeneXpert MRSA Assay (FDA approved for NASAL specimens only), is one component of a comprehensive MRSA colonization surveillance program. It is not intended to diagnose MRSA infection nor to guide or monitor treatment for MRSA infections. Performed at [facility], [address] [place], [place] [address]   Culture, blood (routine x 2)     Status: None   Collection Time: 12/30/18  9:30 AM  Result Value Ref Range Status   Specimen Description BLOOD RIGHT THUMB  Final   Special Requests   Final    BOTTLES DRAWN AEROBIC ONLY Blood Culture results may not be optimal due to an inadequate volume of blood received in culture bottles   Culture   Final    NO GROWTH 5 DAYS Performed at [facility], [address] [place], [place] [address]    Report Status 01/04/2019 FINAL  Final  Culture, blood (routine x 2)     Status: None   Collection Time: 12/30/18  4:32 PM  Result Value Ref Range Status   Specimen Description BLOOD SITE NOT SPECIFIED  Final   Special Requests AEROBIC BOTTLE ONLY Blood Culture adequate volume  Final   Culture   Final    NO GROWTH 5 DAYS Performed at [facility], [address] [place], [place] [address]    Report Status 01/04/2019 FINAL  Final  Respiratory Panel by PCR     Status: None   Collection Time: 01/02/19  9:24 AM  Result Value Ref Range Status   Adenovirus NOT DETECTED NOT DETECTED Final   Coronavirus 229E NOT DETECTED NOT DETECTED Final    Comment: (NOTE) The Coronavirus on the Respiratory Panel, DOES NOT test for the novel  Coronavirus (2019 nCoV)    Coronavirus HKU1 NOT DETECTED NOT DETECTED Final   Coronavirus NL63 NOT DETECTED NOT DETECTED Final   Coronavirus OC43 NOT DETECTED NOT DETECTED Final   Metapneumovirus NOT DETECTED NOT DETECTED Final   Rhinovirus / Enterovirus NOT DETECTED NOT DETECTED Final   Influenza A NOT DETECTED NOT DETECTED  Final   Influenza B NOT DETECTED NOT DETECTED Final   Parainfluenza Virus 1 NOT DETECTED NOT DETECTED Final   Parainfluenza Virus 2 NOT DETECTED NOT DETECTED Final   Parainfluenza Virus 3 NOT DETECTED NOT DETECTED Final   Parainfluenza Virus 4 NOT DETECTED NOT DETECTED Final   Respiratory Syncytial Virus NOT DETECTED NOT DETECTED Final   Bordetella pertussis NOT DETECTED NOT DETECTED Final   Chlamydophila pneumoniae NOT DETECTED NOT DETECTED Final   Mycoplasma pneumoniae NOT DETECTED NOT DETECTED Final    Comment: Performed at [facility], [address] [place], [place] [address]  SARS Coronavirus 2 (CEPHEID - Performed in [facility] hospital lab), Hosp Order     Status: None   Collection Time: 01/02/19  9:24 AM  Result Value Ref Range Status   SARS Coronavirus 2 NEGATIVE NEGATIVE Final    Comment: (NOTE) If result is NEGATIVE SARS-CoV-2 target nucleic acids are NOT DETECTED. The SARS-CoV-2 RNA is generally detectable in upper and lower  respiratory specimens during the acute phase of infection. The lowest  concentration of SARS-CoV-2 viral copies this assay can detect is 250  copies / mL. A negative result does not preclude SARS-CoV-2 infection  and should not be used as the sole basis for treatment or other  patient management decisions.  A negative result may occur with  improper specimen collection / handling, submission of specimen other  than nasopharyngeal swab, presence of viral mutation(s) within the  areas targeted by this assay, and inadequate number of viral copies  (<250 copies / mL). A negative result must be combined with clinical  observations, patient history, and epidemiological information. If result is POSITIVE SARS-CoV-2 target nucleic acids are DETECTED. The SARS-CoV-2 RNA is generally detectable in upper and lower  respiratory specimens dur ing the acute phase of infection.  Positive  results are indicative of active infection with SARS-CoV-2.   Clinical  correlation with patient history and other diagnostic information is  necessary to determine patient infection status.  Positive results do  not rule out bacterial infection or co-infection with other viruses. If result is PRESUMPTIVE POSTIVE SARS-CoV-2 nucleic acids MAY BE PRESENT.   A presumptive positive result was obtained on the submitted specimen  and confirmed on repeat testing.  While 2019 novel coronavirus  (SARS-CoV-2) nucleic acids may be present in the submitted sample  additional confirmatory testing may be necessary for epidemiological  and /  or clinical management purposes  to differentiate between  SARS-CoV-2 and other Sarbecovirus currently known to infect humans.  If clinically indicated additional testing with an alternate test  methodology [phone]) is advised. The SARS-CoV-2 RNA is generally  detectable in upper and lower respiratory sp ecimens during the acute  phase of infection. The expected result is Negative. Fact Sheet for Patients:  BoilerBrush.com.cy Fact Sheet for Healthcare Providers: https://pope.com/ This test is not yet approved or cleared by the [place] FDA and has been authorized for detection and/or diagnosis of SARS-CoV-2 by FDA under an Emergency Use Authorization (EUA).  This EUA will remain in effect (meaning this test can be used) for the duration of the COVID-19 declaration under Section 564(b)(1) of the Act, 21 U.S.C. section 360bbb-3(b)(1), unless the authorization is terminated or revoked sooner. Performed at [facility], [address], [place], [place] [address]     Radiology Reports Ct Head Wo Contrast  Result Date: 12/26/2018 CLINICAL DATA:  73 year old male with ataxia, encephalopathy, atrial fibrillation. EXAM: CT HEAD WITHOUT CONTRAST TECHNIQUE: Contiguous axial images were obtained from the base of the skull through the vertex without intravenous contrast.  COMPARISON:  None. FINDINGS: Brain: Cerebral volume is within normal limits for age. No midline shift, ventriculomegaly, mass effect, evidence of mass lesion, intracranial hemorrhage or evidence of cortically based acute infarction. Patchy periatrial white matter hypodensity. No cortical encephalomalacia. Vascular: Calcified atherosclerosis at the skull base. No suspicious intracranial vascular hyperdensity. Skull: Negative. Sinuses/Orbits: Visualized paranasal sinuses and mastoids are well pneumatized. Other: Negative orbits. Visualized scalp soft tissues are within normal limits. IMPRESSION: 1. No acute intracranial abnormality. 2. Mild for age nonspecific cerebral white matter changes, most commonly due to small vessel disease. Electronically Signed   By: Odessa Fleming M.D.   On: 12/26/2018 01:49   Dg Chest Port 1 View  Result Date: 12/31/2018 CLINICAL DATA:  Dyspnea at rest EXAM: PORTABLE CHEST 1 VIEW COMPARISON:  2019-01-15 chest radiograph. FINDINGS: Stable cardiomediastinal silhouette with normal heart size. No pneumothorax. No left pleural effusion. Chronic blunting of the right costophrenic angle back to 11/26/2018. Hazy upper left lung and right lung base opacity is stable. New left retrocardiac consolidation. IMPRESSION: 1. New left retrocardiac consolidation. Stable hazy upper left lung and right lung base opacity. Findings suggest multilobar pneumonia. 2. Chronic blunting of the right costophrenic angle back to 11/26/2026 radiograph, compatible with small pleural effusion and/or pleural-parenchymal scarring. Electronically Signed   By: Delbert Phenix M.D.   On: 12/31/2018 15:01   Dg Chest Port 1 View  Result Date: Jan 15, 2019 CLINICAL DATA:  73 year old male with sepsis. EXAM: PORTABLE CHEST 1 VIEW COMPARISON:  11/26/2018 and earlier. FINDINGS: Portable AP supine view at 2057 hours. Stable large lung volumes. Stable cardiac size and mediastinal contours. Visualized tracheal air column is within normal  limits. No pneumothorax, pleural effusion or consolidation. New asymmetric reticulonodular opacity in the left upper lung. No acute osseous abnormality identified. IMPRESSION: New left upper lobe reticulonodular pulmonary opacity suspicious for acute viral/atypical respiratory infection. Underlying pulmonary hyperinflation. Electronically Signed   By: Odessa Fleming M.D.   On: Jan 15, 2019 22:02   [place] Ekg Site Rite  Result Date: 01/01/2019 If Site Rite image not attached, placement could not be confirmed due to current cardiac rhythm.   Lab Data:  CBC: Recent Labs  Lab 12/29/18 1008 12/30/18 0228 12/31/18 0403 01/01/19 0454 01/03/19 0638  WBC 13.1* 9.4 8.7 7.9 7.4  NEUTROABS  --   --   --   --  6.8  HGB 9.2* 8.8* 8.2* 8.5* 8.6*  HCT 30.1* 31.0* 28.6* 30.2* 30.1*  MCV 99.0 100.3* 101.4* 101.0* 102.4*  PLT 210 180 176 185 179   Basic Metabolic Panel: Recent Labs  Lab 12/29/18 0312 12/30/18 0228 12/31/18 0403 01/01/19 0454 01/03/19 0638  NA 140 142 142 144 141  K 4.9 4.8 4.4 4.1 4.8  CL 94* 95* 93* 95* 90*  CO2 38* 42* 40* 44* 46*  GLUCOSE 294* 117* 123* 72 135*  BUN 26* CREATININE 0.73 0.54* 0.49* 0.47* 0.56*  CALCIUM 9.4 9.3 9.3 9.2 8.9  MG 2.1 1.9 1.8 1.8  --    GFR: CrCl cannot be calculated (Unknown ideal weight.). Liver Function Tests: No results for input(s): AST, ALT, ALKPHOS, BILITOT, PROT, ALBUMIN in the last 168 hours. No results for input(s): LIPASE, AMYLASE in the last 168 hours. No results for input(s): AMMONIA in the last 168 hours. Coagulation Profile: No results for input(s): INR, PROTIME in the last 168 hours. Cardiac Enzymes: No results for input(s): CKTOTAL, CKMB, CKMBINDEX, TROPONINI in the last 168 hours. BNP (last 3 results) No results for input(s): PROBNP in the last 8760 hours. HbA1C: No results for input(s): HGBA1C in the last 72 hours. CBG: Recent Labs  Lab 01/03/19 1947 01/04/19 0032 01/04/19 0447 01/04/19 0730 01/04/19 1138   GLUCAP 221* 167* 123* 99 177*   Lipid Profile: No results for input(s): CHOL, HDL, LDLCALC, TRIG, CHOLHDL, LDLDIRECT in the last 72 hours. Thyroid Function Tests: No results for input(s): TSH, T4TOTAL, FREET4, T3FREE, THYROIDAB in the last 72 hours. Anemia Panel: No results for input(s): VITAMINB12, FOLATE, FERRITIN, TIBC, IRON, RETICCTPCT in the last 72 hours. Urine analysis:    Component Value Date/Time   COLORURINE AMBER (A) 01/18/19 2036   APPEARANCEUR CLOUDY (A) 2019-01-18 2036   LABSPEC 1.020 01/18/19 2036   PHURINE 5.0 01-18-19 2036   GLUCOSEU >=500 (A) Jan 18, 2019 2036   HGBUR NEGATIVE 01/18/2019 2036   BILIRUBINUR NEGATIVE 01/18/2019 2036   KETONESUR NEGATIVE 2019-01-18 2036   PROTEINUR 100 (A) Jan 18, 2019 2036   NITRITE NEGATIVE 18-Jan-2019 2036   LEUKOCYTESUR NEGATIVE 18-Jan-2019 2036     Taytum Wheller M.D. [facility] 01/04/2019, 1:52 PM  Pager: [phone] Between 7am to 7pm - call Pager - [phone]  After 7pm go to [email or web address] - password [email or web address]  Call night coverage person covering after 7pm

## 2019-01-04 NOTE — Plan of Care (Signed)

## 2019-01-05 ENCOUNTER — Inpatient Hospital Stay (HOSPITAL_COMMUNITY): Payer: Medicare Other

## 2019-01-05 DIAGNOSIS — I361 Nonrheumatic tricuspid (valve) insufficiency: Secondary | ICD-10-CM

## 2019-01-05 DIAGNOSIS — J9622 Acute and chronic respiratory failure with hypercapnia: Secondary | ICD-10-CM

## 2019-01-05 DIAGNOSIS — J9621 Acute and chronic respiratory failure with hypoxia: Secondary | ICD-10-CM

## 2019-01-05 LAB — BASIC METABOLIC PANEL
BUN: 16 mg/dL (ref 8–23)
CO2: >50 mmol/L — ABNORMAL HIGH (ref 22–32)
Calcium: 9.1 mg/dL (ref 8.9–10.3)
Chloride: 81 mmol/L — ABNORMAL LOW (ref 98–111)
Creatinine, Ser: 0.45 mg/dL — ABNORMAL LOW (ref 0.61–1.24)
GFR calc Af Amer: >60 mL/min (ref >60)
GFR calc non Af Amer: >60 mL/min (ref >60)
Glucose, Bld: 122 mg/dL — ABNORMAL HIGH (ref 70–99)
Potassium: 4.2 mmol/L (ref 3.5–5.1)
Sodium: 143 mmol/L (ref 135–145)

## 2019-01-05 LAB — GLUCOSE, CAPILLARY
Glucose-Capillary: 111 mg/dL — ABNORMAL HIGH (ref 70–99)
Glucose-Capillary: 144 mg/dL — ABNORMAL HIGH (ref 70–99)
Glucose-Capillary: 144 mg/dL — ABNORMAL HIGH (ref 70–99)
Glucose-Capillary: 182 mg/dL — ABNORMAL HIGH (ref 70–99)
Glucose-Capillary: 188 mg/dL — ABNORMAL HIGH (ref 70–99)
Glucose-Capillary: 206 mg/dL — ABNORMAL HIGH (ref 70–99)
Glucose-Capillary: 327 mg/dL — ABNORMAL HIGH (ref 70–99)

## 2019-01-05 LAB — ECHOCARDIOGRAM LIMITED: Weight: 2310.42 oz

## 2019-01-05 MED ORDER — AMOXICILLIN-POT CLAVULANATE 875-125 MG PO TABS: 1.0000 | ORAL_TABLET | Freq: Two times a day (BID) | ORAL | Status: DC

## 2019-01-05 MED ORDER — METHYLPREDNISOLONE SODIUM SUCC 125 MG IJ SOLR
60.0000 mg | Freq: Four times a day (QID) | INTRAMUSCULAR | Status: DC
  Administered 2019-01-05 – 2019-01-06 (×4): 60 mg via INTRAVENOUS
  Filled 2019-01-05 (×4): qty 2

## 2019-01-05 MED ORDER — BASAGLAR KWIKPEN 100 UNIT/ML ~~LOC~~ SOPN: 5.0000 [IU] | PEN_INJECTOR | Freq: Every day | SUBCUTANEOUS | Status: AC

## 2019-01-05 MED ORDER — FUROSEMIDE 10 MG/ML IJ SOLN
60.0000 mg | Freq: Four times a day (QID) | INTRAMUSCULAR | Status: AC
  Administered 2019-01-05 – 2019-01-06 (×3): 60 mg via INTRAVENOUS
  Filled 2019-01-05 (×3): qty 6

## 2019-01-05 MED ORDER — INSULIN ASPART 100 UNIT/ML ~~LOC~~ SOLN: 0.0000 [IU] | Freq: Three times a day (TID) | SUBCUTANEOUS | 11 refills | Status: AC

## 2019-01-05 NOTE — Discharge Summary (Signed)
Physician Discharge Summary   Patient ID: Gregory Stone MRN: [id number] DOB/AGE: May 02, 1946 73 y.o.  Admit date: 01-19-19 Discharge date: 01/05/2019  Primary Care Physician:  Patient, No Pcp Per   Recommendations for Outpatient Follow-up:  1. Follow up with PCP in 1-2 weeks 2. Aspiration precautions 3. COVID negative, checked twice, first on 01-19-19 and repeated n 01/02/2019 4. Continue O2 3 to 4 L continuous, baseline 3 L 5. Avoid Ativan 6. Continue Augmentin for 3 days then stop  Home Health: Patient returning back to skilled nursing facility Equipment/Devices:   Discharge Condition: stable  CODE STATUS: DNR Diet recommendation: Carb modified diet, thin liquids   Discharge Diagnoses:    . HCAP (healthcare-associated pneumonia) . Atrial fibrillation with RVR (HCC) . Acute on chronic respiratory failure with hypoxia and hypercarbia (HCC) . HLD (hyperlipidemia) . Acute metabolic encephalopathy resolved . Sepsis due to pneumonia (HCC) . Hyperkalemia . Abnormal LFTs . Elevated troponin secondary to demand ischemia . Lactic acidosis . COPD exacerbation (HCC) . Diabetes mellitus type 2, uncontrolled, with complications (HCC) . Anxiety   Consults: None    Allergies:   Allergies  Allergen Reactions  . Lisinopril Swelling    Angioedema   . Sulfamethoxazole-Trimethoprim Anaphylaxis and Other (See Comments)    Other reaction(s): Muscle Pain   . Ticagrelor Shortness Of Breath       . Trimethoprim Other (See Comments)    Per MAR     DISCHARGE MEDICATIONS: Allergies as of 01/05/2019      Reactions   Lisinopril Swelling   Angioedema   Sulfamethoxazole-trimethoprim Anaphylaxis, Other (See Comments)   Other reaction(s): Muscle Pain   Ticagrelor Shortness Of Breath      Trimethoprim Other (See Comments)   Per MAR      Medication List    STOP taking these medications   doxycycline 100 MG tablet Commonly known as:  VIBRA-TABS   LORazepam 0.5 MG  tablet Commonly known as:  ATIVAN     TAKE these medications   Acidophilus Probiotic 100 MG Caps Take 100 mg by mouth 2 (two) times a day.   amoxicillin-clavulanate 875-125 MG tablet Commonly known as:  Augmentin Take 1 tablet by mouth 2 (two) times daily for 3 days.   aspirin 81 MG EC tablet Take 1 tablet (81 mg total) by mouth daily for 30 days.   atorvastatin 40 MG tablet Commonly known as:  LIPITOR Take 40 mg by mouth at bedtime.   Basaglar KwikPen 100 UNIT/ML Sopn Inject 0.05 mLs (5 Units total) into the skin at bedtime. What changed:    how much to take  when to take this  additional instructions   Eliquis 5 MG Tabs tablet Generic drug:  apixaban Take 5 mg by mouth 2 (two) times daily.   guaiFENesin 600 MG 12 hr tablet Commonly known as:  MUCINEX Take 1,200 mg by mouth 2 (two) times daily.   insulin aspart 100 UNIT/ML injection Commonly known as:  novoLOG Inject 0-9 Units into the skin 3 (three) times daily with meals. Sliding scale CBG 70 - 120: 0 units CBG 121 - 150: 1 unit,  CBG 151 - 200: 2 units,  CBG 201 - 250: 3 units,  CBG 251 - 300: 5 units,  CBG 301 - 350: 7 units,  CBG 351 - 400: 9 units   CBG > 400: 9 units and notify your MD   ipratropium-albuterol 0.5-2.5 (3) MG/3ML Soln Commonly known as:  DUONEB Take 3 mLs by nebulization every 6 (six)  hours.   multivitamin with minerals Tabs tablet Take 1 tablet by mouth daily.   OXYGEN Inhale 3 L into the lungs continuous.   pantoprazole 40 MG tablet Commonly known as:  PROTONIX Take 40 mg by mouth daily at 6 (six) AM.   predniSONE 20 MG tablet Commonly known as:  DELTASONE Take 20 mg by mouth daily.   ProMod Liqd Take 30 mLs by mouth 2 (two) times a day.   sertraline 50 MG tablet Commonly known as:  ZOLOFT Take 50 mg by mouth daily.   vitamin C 500 MG tablet Commonly known as:  ASCORBIC ACID Take 500 mg by mouth daily.        Brief H and P: For complete details please refer to  admission H and P, but in brief [name] Peterkinis a 73 y.o.BM PMHx hyperlipidemia, diabetes mellitus type II uncontrolled with complication, COPDon3 L O2 at baseline, atrial fibrillation on Eliquis,depression, presented with altered mental status and shortness of breath.  Per report, he had well shortness of breath in the facility, found to have O2 sats in 50s.  He was given breathing treatments and had also been treated with a dose of Ativan then patient became minimally responsive.  In ED he was barely arousable.  In ED patient was found to have negative COVID test, negative flu test WBC is 12.3, lactic acid 4.9, troponin  0.03, UA suggestive of UTI.  A. fib with RVR, O2 sats 100% on 5 L. ABG showed pH of 7.18, PCO2 103, PO2 142.  Chest x-ray showed left upper lobe infiltrates.  CT head negative.  Hospital Course:   HCAP (healthcare-associated pneumonia)/sepsis with COPD exacerbation, acute hypercarbic and hypoxic respiratory failure -Patient had been progressively improving, until he was scheduled for discharge on Sunday, 5/3.  Patient however became hypoxic later in the afternoon and chest x-ray showed new pneumonia. -Patient was placed on BiPAP and high flow nasal cannula - repeat chest x-ray showed multifocal pneumonia, repeat COVID test repeated was negative on 01/02/19 -RVP panel showed positive coronavirus H KU 1 -Urine strep antigen, Legionella antigen negative -He was placed on IV Zosyn, Solu-Medrol, has been transitioned to oral prednisone.   Patient received 1 dose of IV Lasix for fluid overload on 5/7 -Transition to oral Augmentin for 3 more days to complete full course, recommend aspiration precautions -Continue O2 3 to 4 L, at baseline he is on O2 3 L continuous and daily prednisone for severe COPD. Patient needs to follow-up with his pulmonologist and PCP in 1 to 2 weeks.  Repeat chest x-ray in 2 weeks to ensure complete resolution of pneumonia.   Acute metabolic  encephalopathy -Likely due to hypercarbic and hypoxic respiratory failure -  currently appears to be at baseline, alert and oriented x3.   Atrial fibrillation with RVR Most likely due to sepsis, #1 Currently normal sinus rhythm, heart rate controlled Continue Eliquis CHADVASC 2  Elevated troponin -Likely due to demand ischemia due to #1 -Currently no chest pain.  EKG showed sinus tachycardia no acute ST-T wave changes suggestive of ACS  Diabetes mellitus type 2 uncontrolled with hyperglycemia -Patient also had episode of hypoglycemia during hospitalization. -Hemoglobin A1c 7.6 -CBGs better with current insulin regimen of Lantus 5 units daily and sliding scale insulin, which he should continue outpatient and adjust according to blood sugars.  Lactic acidosis Resolved  Hyperlipidemia Continue Lipitor  History of depression and anxiety Avoid Ativan, continue Zoloft  Abnormal LFTs Hepatitis panel negative  Day of Discharge  S: On 3 L O2 at the time of my examination, states he is feeling good, denies any specific complaints.  No fevers overnight.  No acute issues.  BP (!) 150/85 (BP Location: Right Arm)   Pulse 80   Temp 98 F (36.7 C) (Oral)   Resp (!) 24   Wt 65.5 kg   SpO2 100%   Physical Exam: General: Alert and awake oriented x3 not in any acute distress. HEENT: anicteric sclera, pupils reactive to light and accommodation CVS: S1-S2 clear no murmur rubs or gallops Chest: Diminished breath sound at the bases Abdomen: soft nontender, nondistended, normal bowel sounds Extremities: no cyanosis, clubbing or edema noted bilaterally Neuro: no new focal neurological deficits   The results of significant diagnostics from this hospitalization (including imaging, microbiology, ancillary and laboratory) are listed below for reference.      Procedures/Studies:  Ct Head Wo Contrast  Result Date: 12/26/2018 CLINICAL DATA:  73 year old male with ataxia,  encephalopathy, atrial fibrillation. EXAM: CT HEAD WITHOUT CONTRAST TECHNIQUE: Contiguous axial images were obtained from the base of the skull through the vertex without intravenous contrast. COMPARISON:  None. FINDINGS: Brain: Cerebral volume is within normal limits for age. No midline shift, ventriculomegaly, mass effect, evidence of mass lesion, intracranial hemorrhage or evidence of cortically based acute infarction. Patchy periatrial white matter hypodensity. No cortical encephalomalacia. Vascular: Calcified atherosclerosis at the skull base. No suspicious intracranial vascular hyperdensity. Skull: Negative. Sinuses/Orbits: Visualized paranasal sinuses and mastoids are well pneumatized. Other: Negative orbits. Visualized scalp soft tissues are within normal limits. IMPRESSION: 1. No acute intracranial abnormality. 2. Mild for age nonspecific cerebral white matter changes, most commonly due to small vessel disease. Electronically Signed   By: Odessa Fleming M.D.   On: 12/26/2018 01:49   Dg Chest Port 1 View  Result Date: 12/31/2018 CLINICAL DATA:  Dyspnea at rest EXAM: PORTABLE CHEST 1 VIEW COMPARISON:   chest radiograph. FINDINGS: Stable cardiomediastinal silhouette with normal heart size. No pneumothorax. No left pleural effusion. Chronic blunting of the right costophrenic angle back to 11/26/2018. Hazy upper left lung and right lung base opacity is stable. New left retrocardiac consolidation. IMPRESSION: 1. New left retrocardiac consolidation. Stable hazy upper left lung and right lung base opacity. Findings suggest multilobar pneumonia. 2. Chronic blunting of the right costophrenic angle back to 11/26/2026 radiograph, compatible with small pleural effusion and/or pleural-parenchymal scarring. Electronically Signed   By: Delbert Phenix M.D.   On: 12/31/2018 15:01   Dg Chest Port 1 View  Result Date: Jan 23, 2019 CLINICAL DATA:  73 year old male with sepsis. EXAM: PORTABLE CHEST 1 VIEW COMPARISON:   11/26/2018 and earlier. FINDINGS: Portable AP supine view at 2057 hours. Stable large lung volumes. Stable cardiac size and mediastinal contours. Visualized tracheal air column is within normal limits. No pneumothorax, pleural effusion or consolidation. New asymmetric reticulonodular opacity in the left upper lung. No acute osseous abnormality identified. IMPRESSION: New left upper lobe reticulonodular pulmonary opacity suspicious for acute viral/atypical respiratory infection. Underlying pulmonary hyperinflation. Electronically Signed   By: Odessa Fleming M.D.   On: 23-Jan-2019 22:02   [place] Ekg Site Rite  Result Date: 01/01/2019 If Site Rite image not attached, placement could not be confirmed due to current cardiac rhythm.      LAB RESULTS: Basic Metabolic Panel: Recent Labs  Lab 01/01/19 0454 01/03/19 0638 01/05/19 0657  NA 144 141 143  K 4.1 4.8 4.2  CL 95* 90* 81*  CO2 44* 46* >50*  GLUCOSE 72 135* 122*  BUN CREATININE 0.47* 0.56* 0.45*  CALCIUM 9.2 8.9 9.1  MG 1.8  --   --    Liver Function Tests: No results for input(s): AST, ALT, ALKPHOS, BILITOT, PROT, ALBUMIN in the last 168 hours. No results for input(s): LIPASE, AMYLASE in the last 168 hours. No results for input(s): AMMONIA in the last 168 hours. CBC: Recent Labs  Lab 01/01/19 0454 01/03/19 0638  WBC 7.9 7.4  NEUTROABS  --  6.8  HGB 8.5* 8.6*  HCT 30.2* 30.1*  MCV 101.0* 102.4*  PLT 185 179   Cardiac Enzymes: No results for input(s): CKTOTAL, CKMB, CKMBINDEX, TROPONINI in the last 168 hours. BNP: Invalid input(s): POCBNP CBG: Recent Labs  Lab 01/05/19 0457 01/05/19 0740  GLUCAP 144* 111*      Disposition and Follow-up: Discharge Instructions    Diet Carb Modified   Complete by:  As directed    Diet Carb Modified   Complete by:  As directed    Discharge instructions   Complete by:  As directed    Receiving facility to return to ER for any acute change in medical condition   Increase  activity slowly   Complete by:  As directed    Increase activity slowly   Complete by:  As directed        DISPOSITION: SNF   DISCHARGE FOLLOW-UP Contact information for after-discharge care    Destination    [facility] .   Service:  Skilled Nursing Contact information: [address] [place] [address] [phone]               Time coordinating discharge:  45 mins   Signed:   Thad Ranger M.D. [facility] 01/05/2019, 9:43 AM

## 2019-01-05 NOTE — Progress Notes (Signed)
Pt off BiPAP  X 2 hours now. Pt on 4 L of Oxygen via nasal cannula with oxygen saturation at mid 92 %  to 100%. Will continue to monitor.

## 2019-01-05 NOTE — Progress Notes (Signed)
Pt off BiPAP to give him a break.  Pt is on 4L of 02 and SP02 of 100%. No distress noted. Will continue to monitor and put back on BiPAP if needed.

## 2019-01-05 NOTE — Progress Notes (Signed)
  Echocardiogram 2D Echocardiogram has been performed.  Gregory Stone 01/05/2019, 2:09 PM

## 2019-01-05 NOTE — Consult Note (Signed)
Name: Gregory OresJoshua Stone MRN: [id number] DOB: [birth year]    ADMISSION DATE:  12/23/2018 CONSULTATION DATE: 01/05/2019  REFERRING MD : [facility]  CHIEF COMPLAINT: Hypoxia  BRIEF PATIENT DESCRIPTION: Elderly male acute on chronic respiratory failure he was apparently failing at this time.  SIGNIFICANT EVENTS  01/05/2019 pulmonary critical care consulted for increasing respiratory distress  STUDIES:  01/05/2019 chest x-ray consistent with edema and COPD   HISTORY OF PRESENT ILLNESS:   73 year old male who was admitted on 12/26/2018 after being brought in from skilled nursing facility with increasing hypoxia.  Rigidly treated as sepsis but with a normal procalcitonin, normal white count and normal tensive.  He is on chronic steroids.  Chronic bronchodilators.  Reported to be on oxygen 24/7 at 4 L nasal cannula.  He was treated aggressively for pneumonia with antibiotics and aggressive diuresis.  He did improve somewhat but now has worsened and is in acute respiratory distress.  He is a DNR this precludes any type of intubation which would probably be futile at this point.  He was tried off BiPAP improperly when acute respiratory failure.  We can try to continue his diuresis, I note that does had no 2D echoes on chart this may be a component of heart failure along with respiratory failure.  We will increase his steroids to Solu-Medrol although I do not hear any bronchospasm at this time.  He may benefit from palliative care consult along with judicious narcotics continue decrease his anxiety.  PAST MEDICAL HISTORY :   has a past medical history of A-fib (HCC), COPD (chronic obstructive pulmonary disease) (HCC), Depression, Diabetes mellitus without complication (HCC), and HLD (hyperlipidemia).  has no past surgical history on file. Prior to Admission medications   Medication Sig Start Date End Date Taking? Authorizing Provider  apixaban (ELIQUIS) 5 MG TABS tablet Take 5 mg by mouth 2 (two) times daily.    Yes [provider]  atorvastatin (LIPITOR) 40 MG tablet Take 40 mg by mouth at bedtime.   Yes [provider]  guaiFENesin (MUCINEX) 600 MG 12 hr tablet Take 1,200 mg by mouth 2 (two) times daily.    Yes [provider]  ipratropium-albuterol (DUONEB) 0.5-2.5 (3) MG/3ML SOLN Take 3 mLs by nebulization every 6 (six) hours.   Yes [provider]  Lactobacillus (ACIDOPHILUS PROBIOTIC) 100 MG CAPS Take 100 mg by mouth 2 (two) times a day.   Yes [provider]  LORazepam (ATIVAN) 0.5 MG tablet Take 0.5 mg by mouth every 12 (twelve) hours as needed for anxiety.   Yes [provider]  Multiple Vitamin (MULTIVITAMIN WITH MINERALS) TABS tablet Take 1 tablet by mouth daily.   Yes [provider]  Nutritional Supplements (PROMOD) LIQD Take 30 mLs by mouth 2 (two) times a day.   Yes [provider]  OXYGEN Inhale 3 L into the lungs continuous.   Yes [provider]  pantoprazole (PROTONIX) 40 MG tablet Take 40 mg by mouth daily at 6 (six) AM.   Yes [provider]  predniSONE (DELTASONE) 20 MG tablet Take 20 mg by mouth daily.   Yes [provider]  sertraline (ZOLOFT) 50 MG tablet Take 50 mg by mouth daily.   Yes [provider]  vitamin C (ASCORBIC ACID) 500 MG tablet Take 500 mg by mouth daily.   Yes [provider]  amoxicillin-clavulanate (AUGMENTIN) 875-125 MG tablet Take 1 tablet by mouth 2 (two) times daily for 3 days. 01/05/19 01/08/19  Rai, Ripudeep K,  MD  aspirin EC 81 MG EC tablet Take 1 tablet (81 mg total) by mouth daily for 30 days. 01/01/19 01/31/19  Spongberg, Susy Frizzle, MD  doxycycline (VIBRA-TABS) 100 MG tablet Take 100 mg by mouth 2 (two) times daily.    [provider]  insulin aspart (NOVOLOG) 100 UNIT/ML injection Inject 0-9 Units into the skin 3 (three) times daily with meals. Sliding scale CBG 70 - 120: 0 units CBG 121 - 150: 1 unit,  CBG 151 - 200: 2 units,  CBG  201 - 250: 3 units,  CBG 251 - 300: 5 units,  CBG 301 - 350: 7 units,  CBG 351 - 400: 9 units   CBG > 400: 9 units and notify your MD 01/05/19   Rai, Delene Ruffini, MD  Insulin Glargine (BASAGLAR KWIKPEN) 100 UNIT/ML SOPN Inject 0.05 mLs (5 Units total) into the skin at bedtime. 01/05/19   Rai, Ripudeep Kirtland Bouchard, MD   Allergies  Allergen Reactions   Lisinopril Swelling    Angioedema    Sulfamethoxazole-Trimethoprim Anaphylaxis and Other (See Comments)    Other reaction(s): Muscle Pain    Ticagrelor Shortness Of Breath        Trimethoprim Other (See Comments)    Per MAR    FAMILY HISTORY:  family history is not on file. SOCIAL HISTORY:  Former smoker  REVIEW OF SYSTEMS:   NA secondary to respiratory distress SUBJECTIVE:  Frail elderly male who appears older than stated age in obvious respiratory distress VITAL SIGNS: Temp:  [98 F (36.7 C)-98.3 F (36.8 C)] 98 F (36.7 C) (05/08 0606) Pulse Rate:  [80-90] 90 (05/08 1110) Resp:  [24-32] 32 (05/08 1110) BP: (143-150)/(77-85) 150/85 (05/08 0606) SpO2:  [90 %-100 %] 99 % (05/08 1110) Weight:  [65.5 kg] 65.5 kg (05/08 0606)  PHYSICAL EXAMINATION: General: Frail cachectic male who is in obvious respiratory distress and cannot come off noninvasive mechanical ventilatory support Neuro: Difficult to assess his mental state due to his respiratory distress HEENT: No JVD or lymphadenopathy is appreciated he has a full facemask for noninvasive mechanical ventilatory support in place Cardiovascular: Heart sounds are distant, tachycardic on monitor Lungs: Decreased air movement Abdomen: Soft nontender Musculoskeletal: Muscular wasting noted in lower extremities, positive edema upper extremities Skin: Warm, have decubitus ulcer sacral area  Recent Labs  Lab 01/01/19 0454 01/03/19 0638 01/05/19 0657  NA 144 141 143  K 4.1 4.8 4.2  CL 95* 90* 81*  CO2 44* 46* >50*  BUN 18 16 16   CREATININE 0.47* 0.56* 0.45*  GLUCOSE 72 135* 122*    Recent Labs  Lab 12/31/18 0403 01/01/19 0454 01/03/19 0638  HGB 8.2* 8.5* 8.6*  HCT 28.6* 30.2* 30.1*  WBC 8.7 7.9 7.4  PLT 176 185 179   No results found.  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  He is also steroid-dependent and prednisone 20 mg daily a skilled nursing facility currently on 40 mg.  He is required noninvasive mechanical ventilatory support despite aggressive diuresis.  Currently on noninvasive mechanical ventilatory support and cannot survive without it at this time Aggressive diuresis if tolerated.  Creatinine noted to be 0.45 with a BUN of 60 therefore he can tolerate diuresis. O2 as needed Continue antimicrobial therapy although chest x-ray is not consistent with infectious process Currently on prednisone 40 mg daily will increase to Solu-Medrol 60 mg IV  every 6 Continue bronchodilators Continue noninvasive mechanical ventilatory support Consideration should be given to palliative care consult  Diabetes mellitus which will worsen with IV steroids Sliding scale insulin  Chronic atrial fibrillation No documented 2D echo Consideration for doing 2D echo although that may be too late in the course of his disease for to make any difference. Rate control Currently on Eliquis  Discussion: 73 year old nursing home patient is O2 dependent at baseline and barely able to ambulate without assistance.  He may be near end-stage COPD he is a full DNR very little offer other than steroids, antibiotics, oxygen and noninvasive mechanical ventilatory support.  He may benefit from palliative care consult and application of judicious steroids to turn down his anxiety thermostat.    Brett Canales Chenell Lozon ACNP [facility] Pager [phone] till 1 pm If no answer page [phone] 01/05/2019, 12:02 PM  01/05/2019, 12:02 PM

## 2019-01-05 NOTE — Progress Notes (Addendum)
[facility]                                                                              Patient Demographics  Gregory Stone, is a 73 y.o. 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  Per report, he had well shortness of breath in the facility, found to have O2 sats in 50s.  He was given breathing treatments and had also been treated with a dose of Ativan then patient became minimally responsive.  In ED he was barely arousable.  In ED patient was found to have negative COVID test, negative flu test WBC is 12.3, lactic acid 4.9, troponin  0.03, UA suggestive of UTI.  A. fib with RVR, O2 sats 100% on 5 L. ABG showed pH of 7.18, PCO2 103, PO2 142.  Chest x-ray showed left upper lobe infiltrates.  CT head negative.   Assessment & Plan    Principal Problem:   HCAP (healthcare-associated pneumonia)/sepsis with COPD exacerbation, acute hypercarbic and hypoxic respiratory failure -Patient had been progressively improving, until he was scheduled for discharge on Sunday, 5/3.   repeat chest x-ray showed multifocal pneumonia, COVID test repeated was negative on 5/5 -RVP panel showed positive coronavirus H KU 1. Urine strep antigen, Legionella antigen negative -Patient was placed on IV Zosyn, Solu-Medrol, transitioned to oral prednisone -Yesterday patient was given Lasix 40 mg IV x1, for possible volume overload (was +6 L) as patient  had been on IV fluids, now I's and O's with 3 L positive. -This morning patient was doing well, down to 3 L at the time of my examination.  Discharge was done for skilled nursing facility today, I just got notified that patient is hypoxic again with O2 sats in the 70s to low 80s, placed back on BiPAP - will get a stat chest x-ray, 2D echocardiogram, no prior echo.  Unlikely to be PE as patient is on Eliquis -CCM consulted, discussed with Dr. Denese Killings.  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  Needs goals of care -Will hold discharge   Active Problems: Acute metabolic encephalopathy -Likely due to hypercarbic and hypoxic respiratory failure,  -This morning at the time of my examination, patient was alert and oriented x3  Atrial fibrillation with RVR Most likely due to sepsis, #1 Currently normal sinus rhythm Continue Eliquis CHADVASC 2  Elevated troponin -Likely due to demand ischemia due to #1 -Currently no chest pain.  EKG showed sinus tachycardia no acute ST-T wave changes suggestive  of ACS  Diabetes mellitus type 2 uncontrolled with hyperglycemia -Hemoglobin A1c 7.6 -CBGs better, continue Lantus 5 units, NovoLog meal coverage and sliding scale insulin sensitive   Lactic acidosis Resolved  Hyperlipidemia Continue Lipitor  History of depression and anxiety Hold Ativan  continue Zoloft  Abnormal LFTs Hepatitis panel negative   Code Status: DNR DVT Prophylaxis: Apixaban Family Communication: Discussed in detail with the patient, all imaging results, lab results explained to the patient.   Addendum 3:35 PM Called patient's daughter to discuss goals of care, unable to make contact, phone keeps going into voicemail.   Disposition Plan: Discharge held, had to place back on BiPAP  Time Spent in minutes 25 minutes  Procedures:  BiPAP  Consultants:     Pulmonary critical care Palliative medicine  Antimicrobials:   Anti-infectives (From admission, onward)   Start     Dose/Rate Route Frequency Ordered Stop   01/05/19 0000  amoxicillin-clavulanate (AUGMENTIN) 875-125 MG tablet     1 tablet Oral 2 times daily 01/05/19 0930 01/08/19 2359   12/31/18 1600  piperacillin-tazobactam (ZOSYN) IVPB 3.375 g     3.375 g 12.5 mL/hr over 240 Minutes Intravenous Every 8 hours 12/31/18 1525     12/31/18 0000  cefdinir (OMNICEF) 300 MG capsule  Status:  Discontinued     300 mg Oral 2 times daily 12/31/18 1041 01/04/19    12/31/18 0000  azithromycin (ZITHROMAX) 500 MG tablet  Status:  Discontinued     500 mg Oral Daily 12/31/18 1041 01/04/19    12/26/18 2200  vancomycin (VANCOCIN) IVPB 1000 mg/200 mL premix  Status:  Discontinued     1,000 mg 200 mL/hr over 60 Minutes Intravenous Every 24 hours 12/26/18 0027 12/26/18 1301   12/26/18 1430  cefTRIAXone (ROCEPHIN) 2 g in sodium chloride 0.9 % 100 mL IVPB  Status:  Discontinued     2 g 200 mL/hr over 30 Minutes Intravenous Every 24 hours 12/26/18 1301 12/31/18 1525   12/26/18 0600  piperacillin-tazobactam (ZOSYN) IVPB 3.375 g  Status:  Discontinued     3.375 g 12.5 mL/hr over 240 Minutes Intravenous Every 8 hours 12/26/18 0027 12/26/18 1301   12/26/18 0030  azithromycin (ZITHROMAX) 500 mg in sodium chloride 0.9 % 250 mL IVPB  Status:  Discontinued     500 mg 250 mL/hr over 60 Minutes Intravenous Daily at bedtime 12/26/18 0013 12/31/18 1530   2019/01/09 2030  vancomycin (VANCOCIN) IVPB 1000 mg/200 mL premix     1,000 mg 200 mL/hr over 60 Minutes Intravenous  Once Jan 09, 2019 2029 2019/01/09 2208   01-09-19 2030  piperacillin-tazobactam (ZOSYN) IVPB 3.375 g     3.375 g 100 mL/hr over 30 Minutes Intravenous  Once Jan 09, 2019 2029 09-Jan-2019 2135         Medications  Scheduled Meds:  apixaban  5 mg Oral BID   aspirin EC  81 mg Oral Daily   atorvastatin  20 mg Oral q1800   dextromethorphan-guaiFENesin  1  tablet Oral BID   famotidine  20 mg Oral Daily   feeding supplement (PRO-STAT SUGAR FREE 64)  30 mL Oral BID   fluticasone  2 spray Each Nare Daily   insulin aspart  0-9 Units Subcutaneous Q4H   insulin aspart  3 Units Subcutaneous TID WC   insulin glargine  5 Units Subcutaneous QHS   ipratropium-albuterol  3 mL Nebulization TID   predniSONE  40 mg Oral QAC breakfast   Ensure Max Protein  11 oz Oral Daily   sertraline  50 mg Oral Daily   sodium chloride flush  10-40 mL Intracatheter Q12H   Continuous Infusions:  piperacillin-tazobactam (ZOSYN)  IV 3.375 g (01/05/19 0608)   PRN Meds:.albuterol, hydrALAZINE, ipratropium-albuterol, ondansetron (ZOFRAN) IV, sodium chloride, sodium chloride flush      Subjective:   Sadat Sliwa was seen and examined today.  The time of my examination this morning patient was alert and oriented, did not have any complaints.  However subsequently became hypoxic with O2 sats in 70s and had to be placed on BiPAP.  No fevers or chills.  Objective:   Vitals:   01/04/19 1949 01/05/19 0606 01/05/19 0748 01/05/19 1110  BP:  (!) 150/85    Pulse:  80  90  Resp:  (!) 24  (!) 32  Temp:  98 F (36.7 C)    TempSrc:  Oral    SpO2: 94% 100% 100% 99%  Weight:  65.5 kg      Intake/Output Summary (Last 24 hours) at 01/05/2019 1126 Last data filed at 01/05/2019 0600 Gross per 24 hour  Intake 384.39 ml  Output 3200 ml  Net -2815.61 ml     Wt Readings from Last 3 Encounters:  01/05/19 65.5 kg   Physical Exam  General: Alert and oriented x 3,, frail and cachectic  Eyes:   HEENT:  Atraumatic, normocephalic  Cardiovascular: S1 S2 clear, no murmurs, RRR. No pedal edema b/l  Respiratory: Decreased air movement throughout  Gastrointestinal: Soft, nontender, nondistended, NBS  Ext: no pedal edema bilaterally  Neuro: no new deficits  Musculoskeletal: No cyanosis, clubbing  Skin:   Psych: Normal affect and demeanor, alert and oriented  x3     Data Reviewed:  I have personally reviewed following labs and imaging studies  Micro Results Recent Results (from the past 240 hour(s))  MRSA PCR Screening     Status: None   Collection Time: 12/26/18  4:45 PM  Result Value Ref Range Status   MRSA by PCR NEGATIVE NEGATIVE Final    Comment:        The GeneXpert MRSA Assay (FDA approved for NASAL specimens only), is one component of a comprehensive MRSA colonization surveillance program. It is not intended to diagnose MRSA infection nor to guide or monitor treatment for MRSA infections. Performed at [facility], [address] [place], [place] [address]   Culture, blood (routine x 2)     Status: None   Collection Time: 12/30/18  9:30 AM  Result Value Ref Range Status   Specimen Description BLOOD RIGHT THUMB  Final   Special Requests   Final    BOTTLES DRAWN AEROBIC ONLY Blood Culture results may not be optimal due to an inadequate volume of blood received in culture bottles   Culture   Final    NO GROWTH 5 DAYS Performed at [facility], [address], [place], [place] [address]    Report Status 01/04/2019 FINAL  Final  Culture, blood (routine x 2)     Status: None   Collection Time: 12/30/18  4:32 PM  Result Value Ref Range Status   Specimen Description BLOOD SITE NOT SPECIFIED  Final   Special Requests AEROBIC BOTTLE ONLY Blood Culture adequate volume  Final   Culture   Final    NO GROWTH 5 DAYS Performed at [facility], [address] [place], [place] [address]    Report Status 01/04/2019 FINAL  Final  Respiratory Panel by PCR     Status: None  Collection Time: 01/02/19  9:24 AM  Result Value Ref Range Status   Adenovirus NOT DETECTED NOT DETECTED Final   Coronavirus 229E NOT DETECTED NOT DETECTED Final    Comment: (NOTE) The Coronavirus on the Respiratory Panel, DOES NOT test for the novel  Coronavirus (2019 nCoV)    Coronavirus HKU1 NOT DETECTED NOT DETECTED Final   Coronavirus  NL63 NOT DETECTED NOT DETECTED Final   Coronavirus OC43 NOT DETECTED NOT DETECTED Final   Metapneumovirus NOT DETECTED NOT DETECTED Final   Rhinovirus / Enterovirus NOT DETECTED NOT DETECTED Final   Influenza A NOT DETECTED NOT DETECTED Final   Influenza B NOT DETECTED NOT DETECTED Final   Parainfluenza Virus 1 NOT DETECTED NOT DETECTED Final   Parainfluenza Virus 2 NOT DETECTED NOT DETECTED Final   Parainfluenza Virus 3 NOT DETECTED NOT DETECTED Final   Parainfluenza Virus 4 NOT DETECTED NOT DETECTED Final   Respiratory Syncytial Virus NOT DETECTED NOT DETECTED Final   Bordetella pertussis NOT DETECTED NOT DETECTED Final   Chlamydophila pneumoniae NOT DETECTED NOT DETECTED Final   Mycoplasma pneumoniae NOT DETECTED NOT DETECTED Final    Comment: Performed at [facility], [address] [place], [place] [address]  SARS Coronavirus 2 (CEPHEID - Performed in [facility] hospital lab), Hosp Order     Status: None   Collection Time: 01/02/19  9:24 AM  Result Value Ref Range Status   SARS Coronavirus 2 NEGATIVE NEGATIVE Final    Comment: (NOTE) If result is NEGATIVE SARS-CoV-2 target nucleic acids are NOT DETECTED. The SARS-CoV-2 RNA is generally detectable in upper and lower  respiratory specimens during the acute phase of infection. The lowest  concentration of SARS-CoV-2 viral copies this assay can detect is 250  copies / mL. A negative result does not preclude SARS-CoV-2 infection  and should not be used as the sole basis for treatment or other  patient management decisions.  A negative result may occur with  improper specimen collection / handling, submission of specimen other  than nasopharyngeal swab, presence of viral mutation(s) within the  areas targeted by this assay, and inadequate number of viral copies  (<250 copies / mL). A negative result must be combined with clinical  observations, patient history, and epidemiological information. If result is  POSITIVE SARS-CoV-2 target nucleic acids are DETECTED. The SARS-CoV-2 RNA is generally detectable in upper and lower  respiratory specimens dur ing the acute phase of infection.  Positive  results are indicative of active infection with SARS-CoV-2.  Clinical  correlation with patient history and other diagnostic information is  necessary to determine patient infection status.  Positive results do  not rule out bacterial infection or co-infection with other viruses. If result is PRESUMPTIVE POSTIVE SARS-CoV-2 nucleic acids MAY BE PRESENT.   A presumptive positive result was obtained on the submitted specimen  and confirmed on repeat testing.  While 2019 novel coronavirus  (SARS-CoV-2) nucleic acids may be present in the submitted sample  additional confirmatory testing may be necessary for epidemiological  and / or clinical management purposes  to differentiate between  SARS-CoV-2 and other Sarbecovirus currently known to infect humans.  If clinically indicated additional testing with an alternate test  methodology [phone]) is advised. The SARS-CoV-2 RNA is generally  detectable in upper and lower respiratory sp ecimens during the acute  phase of infection. The expected result is Negative. Fact Sheet for Patients:  BoilerBrush.com.cy Fact Sheet for Healthcare Providers: https://pope.com/ This test is not yet approved or  cleared by the [place] FDA and has been authorized for detection and/or diagnosis of SARS-CoV-2 by FDA under an Emergency Use Authorization (EUA).  This EUA will remain in effect (meaning this test can be used) for the duration of the COVID-19 declaration under Section 564(b)(1) of the Act, 21 U.S.C. section 360bbb-3(b)(1), unless the authorization is terminated or revoked sooner. Performed at [facility], [address] [place], [place] [address]     Radiology Reports Ct Head Wo Contrast  Result  Date: 12/26/2018 CLINICAL DATA:  73 year old male with ataxia, encephalopathy, atrial fibrillation. EXAM: CT HEAD WITHOUT CONTRAST TECHNIQUE: Contiguous axial images were obtained from the base of the skull through the vertex without intravenous contrast. COMPARISON:  None. FINDINGS: Brain: Cerebral volume is within normal limits for age. No midline shift, ventriculomegaly, mass effect, evidence of mass lesion, intracranial hemorrhage or evidence of cortically based acute infarction. Patchy periatrial white matter hypodensity. No cortical encephalomalacia. Vascular: Calcified atherosclerosis at the skull base. No suspicious intracranial vascular hyperdensity. Skull: Negative. Sinuses/Orbits: Visualized paranasal sinuses and mastoids are well pneumatized. Other: Negative orbits. Visualized scalp soft tissues are within normal limits. IMPRESSION: 1. No acute intracranial abnormality. 2. Mild for age nonspecific cerebral white matter changes, most commonly due to small vessel disease. Electronically Signed   By: Odessa FlemingH  Hall M.D.   On: 12/26/2018 01:49   Dg Chest Port 1 View  Result Date: 12/31/2018 CLINICAL DATA:  Dyspnea at rest EXAM: PORTABLE CHEST 1 VIEW COMPARISON:  06/10/19 chest radiograph. FINDINGS: Stable cardiomediastinal silhouette with normal heart size. No pneumothorax. No left pleural effusion. Chronic blunting of the right costophrenic angle back to 11/26/2018. Hazy upper left lung and right lung base opacity is stable. New left retrocardiac consolidation. IMPRESSION: 1. New left retrocardiac consolidation. Stable hazy upper left lung and right lung base opacity. Findings suggest multilobar pneumonia. 2. Chronic blunting of the right costophrenic angle back to 11/26/2026 radiograph, compatible with small pleural effusion and/or pleural-parenchymal scarring. Electronically Signed   By: Delbert PhenixJason A Poff M.D.   On: 12/31/2018 15:01   Dg Chest Port 1 View  Result Date: 12/07/18 CLINICAL DATA:  73 year old  male with sepsis. EXAM: PORTABLE CHEST 1 VIEW COMPARISON:  11/26/2018 and earlier. FINDINGS: Portable AP supine view at 2057 hours. Stable large lung volumes. Stable cardiac size and mediastinal contours. Visualized tracheal air column is within normal limits. No pneumothorax, pleural effusion or consolidation. New asymmetric reticulonodular opacity in the left upper lung. No acute osseous abnormality identified. IMPRESSION: New left upper lobe reticulonodular pulmonary opacity suspicious for acute viral/atypical respiratory infection. Underlying pulmonary hyperinflation. Electronically Signed   By: Odessa FlemingH  Hall M.D.   On: 06/10/19 22:02   [place] Ekg Site Rite  Result Date: 01/01/2019 If Site Rite image not attached, placement could not be confirmed due to current cardiac rhythm.   Lab Data:  CBC: Recent Labs  Lab 12/30/18 0228 12/31/18 0403 01/01/19 0454 01/03/19 0638  WBC 9.4 8.7 7.9 7.4  NEUTROABS  --   --   --  6.8  HGB 8.8* 8.2* 8.5* 8.6*  HCT 31.0* 28.6* 30.2* 30.1*  MCV 100.3* 101.4* 101.0* 102.4*  PLT 180 176 185 179   Basic Metabolic Panel: Recent Labs  Lab 12/30/18 0228 12/31/18 0403 01/01/19 0454 01/03/19 0638 01/05/19 0657  NA 142 142 144 141 143  K 4.8 4.4 4.1 4.8 4.2  CL 95* 93* 95* 90* 81*  CO2 42* 40* 44* 46* >50*  GLUCOSE 117* 123* 72 135* 122*  BUN 19 18 18 16 16   CREATININE 0.54* 0.49* 0.47* 0.56* 0.45*  CALCIUM 9.3 9.3 9.2 8.9 9.1  MG 1.9 1.8 1.8  --   --    GFR: CrCl cannot be calculated (Unknown ideal weight.). Liver Function Tests: No results for input(s): AST, ALT, ALKPHOS, BILITOT, PROT, ALBUMIN in the last 168 hours. No results for input(s): LIPASE, AMYLASE in the last 168 hours. No results for input(s): AMMONIA in the last 168 hours. Coagulation Profile: No results for input(s): INR, PROTIME in the last 168 hours. Cardiac Enzymes: No results for input(s): CKTOTAL, CKMB, CKMBINDEX, TROPONINI in the last 168 hours. BNP (last 3 results) No results  for input(s): PROBNP in the last 8760 hours. HbA1C: No results for input(s): HGBA1C in the last 72 hours. CBG: Recent Labs  Lab 01/04/19 1945 01/05/19 0020 01/05/19 0457 01/05/19 0740 01/05/19 1031  GLUCAP 212* 144* 144* 111* 188*   Lipid Profile: No results for input(s): CHOL, HDL, LDLCALC, TRIG, CHOLHDL, LDLDIRECT in the last 72 hours. Thyroid Function Tests: No results for input(s): TSH, T4TOTAL, FREET4, T3FREE, THYROIDAB in the last 72 hours. Anemia Panel: No results for input(s): VITAMINB12, FOLATE, FERRITIN, TIBC, IRON, RETICCTPCT in the last 72 hours. Urine analysis:    Component Value Date/Time   COLORURINE AMBER (A) Dec 30, 2018 2036   APPEARANCEUR CLOUDY (A) 12-30-2018 2036   LABSPEC 1.020 2018/12/30 2036   PHURINE 5.0 12-30-18 2036   GLUCOSEU >=500 (A) Dec 30, 2018 2036   HGBUR NEGATIVE 30-Dec-2018 2036   BILIRUBINUR NEGATIVE 2018/12/30 2036   KETONESUR NEGATIVE 12/30/18 2036   PROTEINUR 100 (A) 12/30/2018 2036   NITRITE NEGATIVE 12-30-2018 2036   LEUKOCYTESUR NEGATIVE 12/30/18 2036     Nikky Duba M.D. [facility] 01/05/2019, 11:26 AM  Pager: [phone] Between 7am to 7pm - call Pager - [phone]  After 7pm go to [email or web address] - password [email or web address]  Call night coverage person covering after 7pm

## 2019-01-05 NOTE — Plan of Care (Signed)

## 2019-01-05 NOTE — Progress Notes (Signed)
Pt back on BIPAP, O2 saturation in the 70's at 4 L nasal cannula, up to 6 L but his O2 sat only in the 80's . RT aware, Dr. Isidoro Donning updated with new order for CXR .

## 2019-01-05 NOTE — TOC Progression Note (Signed)
Transition of Care [facility]) - Progression Note    Patient Details  Name: Gregory Stone MRN: [id number] Date of Birth: 1945/12/06  Transition of Care [facility]) CM/SW Contact  Nada Boozer Eavan Gonterman, LCSWA Phone Number: 01/05/2019, 11:10 AM  Clinical Narrative:     Patient de-sated and is now currently back on Bipap. Patient is not medically stable enough to discharge back to [facility] at this time.   CSW will continue to follow and assist with discharge once patient becomes more stable.    Expected Discharge Plan: Skilled Nursing Facility Barriers to Discharge: No Barriers Identified  Expected Discharge Plan and Services Expected Discharge Plan: Skilled Nursing Facility In-house Referral: Clinical Social Work Discharge Planning Services: NA Post Acute Care Choice: Skilled Nursing Facility Living arrangements for the past 2 months: Skilled Nursing Facility Expected Discharge Date: 01/05/19               DME Arranged: N/A DME Agency: NA       HH Arranged: NA HH Agency: NA         Social Determinants of Health (SDOH) Interventions    Readmission Risk Interventions Readmission Risk Prevention Plan 12/29/2018  Transportation Screening Complete  PCP or Specialist Appt within 5-7 Days Complete  Home Care Screening Complete  Medication Review (RN CM) Complete

## 2019-01-06 DIAGNOSIS — Z7189 Other specified counseling: Secondary | ICD-10-CM

## 2019-01-06 DIAGNOSIS — Z66 Do not resuscitate: Secondary | ICD-10-CM

## 2019-01-06 LAB — GLUCOSE, CAPILLARY
Glucose-Capillary: 137 mg/dL — ABNORMAL HIGH (ref 70–99)
Glucose-Capillary: 152 mg/dL — ABNORMAL HIGH (ref 70–99)
Glucose-Capillary: 201 mg/dL — ABNORMAL HIGH (ref 70–99)
Glucose-Capillary: 207 mg/dL — ABNORMAL HIGH (ref 70–99)
Glucose-Capillary: 223 mg/dL — ABNORMAL HIGH (ref 70–99)
Glucose-Capillary: 258 mg/dL — ABNORMAL HIGH (ref 70–99)
Glucose-Capillary: 99 mg/dL (ref 70–99)

## 2019-01-06 MED ORDER — PREDNISONE 10 MG PO TABS
10.0000 mg | ORAL_TABLET | Freq: Every day | ORAL | Status: DC
  Administered 2019-01-07 – 2019-01-12 (×6): 10 mg via ORAL
  Filled 2019-01-06 (×6): qty 1

## 2019-01-06 MED ORDER — FUROSEMIDE 40 MG PO TABS
40.0000 mg | ORAL_TABLET | Freq: Every day | ORAL | Status: DC
  Administered 2019-01-06 – 2019-01-11 (×6): 40 mg via ORAL
  Filled 2019-01-06 (×6): qty 1

## 2019-01-06 NOTE — Progress Notes (Signed)
PALLIATIVE NOTE:   Referral deep for goals of care discussion.  Patient assessed at bedside.  He was able to engage in discussion with minimum shortness of breath.  Denied pain.  No family at bedside due to COVID-19 restrictions.  He is able to appropriately identify his name, date of birth, and location.  Some confusion noted during conversation in regards to history and current medical condition.  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  He states he has 5 children however he is in contact mainly with his 2 children [place].  He reported he is from referred but has been living in [place] in a facility for rehabilitation.  He reports daughter Lucendia Herrlich is his medical decision maker/POA.  Attempt made at the bedside to get in contact with his daughter as he requested.  Voicemail left and phone number for her to get in contact with myself as well as the patient who is requesting to speak with her.  Based on patient's comorbidities and current condition requiring intermittent usage of BiPAP and high oxygen demand, limited therapeutic medical interventions, and overall functional state patient would most likely benefit from outpatient hospice support.  I discussed this briefly with him and he continues to request we speak with his daughter and stated "I don't know, I will do what you guys say and what she says".   If patient is discharged prior to palliative and able to make contact with daughter recommendations would be for patient to be discharged back to facility/home with outpatient palliative to further continue discussions regarding hospice support.  Assessment: Awake, alert, oriented and able to follow commands, diminished bases 4L/Allen, abdomen soft, positive bowel sounds x4, bilateral lower extremity non-pitting edema.   Plan: -DNR per chart, patient confirms -Attempted to contacted daughter/son on listed phone numbers. Voicemail left -Outpatient palliative at  minimum if no contact is made with family for detailed GOC discussion prior to discharge. -PMT will continue to support and follow   Total Time: 45 min.   Greater than 50%  of this time was spent counseling and coordinating care related to the above assessment and plan  Willette Alma, AGPCNP-BC Palliative Medicine Team  Phone: [phone] Pager: [phone] Amion: N. Cousar

## 2019-01-06 NOTE — Progress Notes (Signed)
  Several attempts made and voicemails left with daughter, Lucendia Herrlich [phone]) and son Sylvanus [phone]). No returned phone calls. Palliative will attempt to make contact with family.  Willette Alma, AGPCNP-BC Palliative Medicine Team  Pager: [phone] Amion: Thea Alken

## 2019-01-06 NOTE — Progress Notes (Signed)
[facility]                                                                              Patient Demographics  Gregory Stone, is a 73 y.o. 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  Per report, he had well shortness of breath in the facility, found to have O2 sats in 50s.  He was given breathing treatments and had also been treated with a dose of Ativan then patient became minimally responsive.  In ED he was barely arousable.  In ED patient was found to have negative COVID test, negative flu test WBC is 12.3, lactic acid 4.9, troponin  0.03, UA suggestive of UTI.  A. fib with RVR, O2 sats 100% on 5 L. ABG showed pH of 7.18, PCO2 103, PO2 142.  Chest x-ray showed left upper lobe infiltrates.  CT head negative.   Assessment & Plan    Principal Problem:   HCAP (healthcare-associated pneumonia)/sepsis with COPD exacerbation, acute hypercarbic and hypoxic respiratory failure -Patient had been progressively improving, until he was scheduled for discharge on Sunday, 5/3.   repeat chest x-ray showed multifocal pneumonia, COVID test repeated was negative on 5/5 -RVP panel showed positive coronavirus H KU 1. Urine strep antigen, Legionella antigen negative -Patient was again scheduled for discharge on 01/05/2019 when he became hypoxic and was placed on BiPAP again.   -CCM was consulted, appreciate recommendations.  Palliative also  consulted for goals of care however unable to make contact with the family -Continue Lasix, transition to oral prednisone -This morning alert and oriented,  O2 sats in 95% on 4 L 2D echo showed EF of 55 to 60%, possible hypokinesis of left ventricular basal-mid inferior wall.  Active Problems: Acute metabolic encephalopathy -Likely due to hypercarbic and hypoxic respiratory failure,  -Resolved, this morning alert and oriented, eating breakfast without any difficulty  Atrial fibrillation with RVR -Most likely due to sepsis, #1 -Currently normal sinus rhythm -Continue Eliquis, CHADVASC 2  Elevated troponin -Likely due to demand ischemia due to #1 -Currently no chest pain.  EKG showed sinus tachycardia no acute ST-T wave changes suggestive of ACS  Diabetes mellitus type 2 uncontrolled with hyperglycemia -Hemoglobin A1c 7.6 -Transition to oral prednisone, hopefully CBGs will improve for now continue Lantus 5 units, NovoLog meal coverage and SSI.     Lactic acidosis Resolved  Hyperlipidemia Continue Lipitor  History of depression and anxiety Hold Ativan  continue Zoloft  Abnormal LFTs Hepatitis panel negative   Code Status: DNR DVT Prophylaxis: Apixaban Family Communication: Discussed in detail with the patient, all imaging  results, lab results explained to the patient.    Disposition Plan: Await goals of care.  Per patient's skilled nursing facility, has to be off BiPAP for 24 hours before he can be accepted back.  Time Spent in minutes 25 minutes  Procedures:  BiPAP  Consultants:   Pulmonary critical care Palliative medicine  Antimicrobials:   Anti-infectives (From admission, onward)   Start     Dose/Rate Route Frequency Ordered Stop   01/05/19 0000  amoxicillin-clavulanate (AUGMENTIN) 875-125 MG tablet     1 tablet Oral 2 times daily 01/05/19 0930 01/08/19 2359   12/31/18 1600  piperacillin-tazobactam (ZOSYN) IVPB 3.375 g     3.375 g 12.5 mL/hr over 240  Minutes Intravenous Every 8 hours 12/31/18 1525 01/07/19 1359   12/31/18 0000  cefdinir (OMNICEF) 300 MG capsule  Status:  Discontinued     300 mg Oral 2 times daily 12/31/18 1041 01/04/19    12/31/18 0000  azithromycin (ZITHROMAX) 500 MG tablet  Status:  Discontinued     500 mg Oral Daily 12/31/18 1041 01/04/19    12/26/18 2200  vancomycin (VANCOCIN) IVPB 1000 mg/200 mL premix  Status:  Discontinued     1,000 mg 200 mL/hr over 60 Minutes Intravenous Every 24 hours 12/26/18 0027 12/26/18 1301   12/26/18 1430  cefTRIAXone (ROCEPHIN) 2 g in sodium chloride 0.9 % 100 mL IVPB  Status:  Discontinued     2 g 200 mL/hr over 30 Minutes Intravenous Every 24 hours 12/26/18 1301 12/31/18 1525   12/26/18 0600  piperacillin-tazobactam (ZOSYN) IVPB 3.375 g  Status:  Discontinued     3.375 g 12.5 mL/hr over 240 Minutes Intravenous Every 8 hours 12/26/18 0027 12/26/18 1301   12/26/18 0030  azithromycin (ZITHROMAX) 500 mg in sodium chloride 0.9 % 250 mL IVPB  Status:  Discontinued     500 mg 250 mL/hr over 60 Minutes Intravenous Daily at bedtime 12/26/18 0013 12/31/18 1530   12/23/2018 2030  vancomycin (VANCOCIN) IVPB 1000 mg/200 mL premix     1,000 mg 200 mL/hr over 60 Minutes Intravenous  Once 12/13/2018 2029 12/10/2018 2208   12/02/2018 2030  piperacillin-tazobactam (ZOSYN) IVPB 3.375 g     3.375 g 100 mL/hr over 30 Minutes Intravenous  Once 12/27/2018 2029 12/02/2018 2135         Medications  Scheduled Meds:  apixaban  5 mg Oral BID   aspirin EC  81 mg Oral Daily   atorvastatin  20 mg Oral q1800   dextromethorphan-guaiFENesin  1 tablet Oral BID   famotidine  20 mg Oral Daily   feeding supplement (PRO-STAT SUGAR FREE 64)  30 mL Oral BID   fluticasone  2 spray Each Nare Daily   furosemide  40 mg Oral Daily   insulin aspart  0-9 Units Subcutaneous Q4H   insulin aspart  3 Units Subcutaneous TID WC   insulin glargine  5 Units Subcutaneous QHS   ipratropium-albuterol  3 mL Nebulization TID     [START ON 01/07/2019] predniSONE  10 mg Oral Q breakfast   Ensure Max Protein  11 oz Oral Daily   sertraline  50 mg Oral Daily   sodium chloride flush  10-40 mL Intracatheter Q12H   Continuous Infusions:  piperacillin-tazobactam (ZOSYN)  IV 3.375 g (01/06/19 1357)   PRN Meds:.albuterol, hydrALAZINE, ipratropium-albuterol, ondansetron (ZOFRAN) IV, sodium chloride, sodium chloride flush      Subjective:   Camil Wilhelmsen was seen and examined today.  This morning no acute issues, eating breakfast  without any difficulty.  O2 sats in 90s on 4 L O2.  No fever chills, chest pain or shortness of breath.  Denies any dizziness, nausea vomiting, abdominal pain.  Objective:   Vitals:   01/05/19 2029 01/06/19 0546 01/06/19 0827 01/06/19 0833  BP:  (!) 141/88 130/66   Pulse: 90  78   Resp: (!) 32 (!) 22 19   Temp:  98 F (36.7 C) 98.7 F (37.1 C)   TempSrc:  Axillary Oral   SpO2: 96%  98% 95%  Weight:  62.4 kg      Intake/Output Summary (Last 24 hours) at 01/06/2019 1508 Last data filed at 01/06/2019 1230 Gross per 24 hour  Intake 607.19 ml  Output 3970 ml  Net -3362.81 ml     Wt Readings from Last 3 Encounters:  01/06/19 62.4 kg    Physical Exam  General: Alert and oriented x 3,, frail  Eyes:  HEENT:  Atraumatic, normocephalic  Cardiovascular: S1 S2 clear, RRR. No pedal edema b/l  Respiratory: Bibasilar crackles  Gastrointestinal: Soft, nontender, nondistended, NBS  Ext: no pedal edema bilaterally  Neuro: no new deficits  Musculoskeletal: No cyanosis, clubbing  Skin: No rashes  Psych: Normal affect and demeanor, alert and oriented x3     Data Reviewed:  I have personally reviewed following labs and imaging studies  Micro Results Recent Results (from the past 240 hour(s))  Culture, blood (routine x 2)     Status: None   Collection Time: 12/30/18  9:30 AM  Result Value Ref Range Status   Specimen Description BLOOD RIGHT THUMB  Final   Special  Requests   Final    BOTTLES DRAWN AEROBIC ONLY Blood Culture results may not be optimal due to an inadequate volume of blood received in culture bottles   Culture   Final    NO GROWTH 5 DAYS Performed at [facility], [address], [place], [place] [address]    Report Status 01/04/2019 FINAL  Final  Culture, blood (routine x 2)     Status: None   Collection Time: 12/30/18  4:32 PM  Result Value Ref Range Status   Specimen Description BLOOD SITE NOT SPECIFIED  Final   Special Requests AEROBIC BOTTLE ONLY Blood Culture adequate volume  Final   Culture   Final    NO GROWTH 5 DAYS Performed at [facility], [address] [place], [place] [address]    Report Status 01/04/2019 FINAL  Final  Respiratory Panel by PCR     Status: None   Collection Time: 01/02/19  9:24 AM  Result Value Ref Range Status   Adenovirus NOT DETECTED NOT DETECTED Final   Coronavirus 229E NOT DETECTED NOT DETECTED Final    Comment: (NOTE) The Coronavirus on the Respiratory Panel, DOES NOT test for the novel  Coronavirus (2019 nCoV)    Coronavirus HKU1 NOT DETECTED NOT DETECTED Final   Coronavirus NL63 NOT DETECTED NOT DETECTED Final   Coronavirus OC43 NOT DETECTED NOT DETECTED Final   Metapneumovirus NOT DETECTED NOT DETECTED Final   Rhinovirus / Enterovirus NOT DETECTED NOT DETECTED Final   Influenza A NOT DETECTED NOT DETECTED Final   Influenza B NOT DETECTED NOT DETECTED Final   Parainfluenza Virus 1 NOT DETECTED NOT DETECTED Final   Parainfluenza Virus 2 NOT DETECTED NOT DETECTED Final   Parainfluenza Virus 3 NOT DETECTED NOT DETECTED Final   Parainfluenza Virus 4 NOT DETECTED NOT DETECTED Final   Respiratory Syncytial Virus NOT  DETECTED NOT DETECTED Final   Bordetella pertussis NOT DETECTED NOT DETECTED Final   Chlamydophila pneumoniae NOT DETECTED NOT DETECTED Final   Mycoplasma pneumoniae NOT DETECTED NOT DETECTED Final    Comment: Performed at [facility], [address],  [place], [place] [address]  SARS Coronavirus 2 (CEPHEID - Performed in [facility] hospital lab), Hosp Order     Status: None   Collection Time: 01/02/19  9:24 AM  Result Value Ref Range Status   SARS Coronavirus 2 NEGATIVE NEGATIVE Final    Comment: (NOTE) If result is NEGATIVE SARS-CoV-2 target nucleic acids are NOT DETECTED. The SARS-CoV-2 RNA is generally detectable in upper and lower  respiratory specimens during the acute phase of infection. The lowest  concentration of SARS-CoV-2 viral copies this assay can detect is 250  copies / mL. A negative result does not preclude SARS-CoV-2 infection  and should not be used as the sole basis for treatment or other  patient management decisions.  A negative result may occur with  improper specimen collection / handling, submission of specimen other  than nasopharyngeal swab, presence of viral mutation(s) within the  areas targeted by this assay, and inadequate number of viral copies  (<250 copies / mL). A negative result must be combined with clinical  observations, patient history, and epidemiological information. If result is POSITIVE SARS-CoV-2 target nucleic acids are DETECTED. The SARS-CoV-2 RNA is generally detectable in upper and lower  respiratory specimens dur ing the acute phase of infection.  Positive  results are indicative of active infection with SARS-CoV-2.  Clinical  correlation with patient history and other diagnostic information is  necessary to determine patient infection status.  Positive results do  not rule out bacterial infection or co-infection with other viruses. If result is PRESUMPTIVE POSTIVE SARS-CoV-2 nucleic acids MAY BE PRESENT.   A presumptive positive result was obtained on the submitted specimen  and confirmed on repeat testing.  While 2019 novel coronavirus  (SARS-CoV-2) nucleic acids may be present in the submitted sample  additional confirmatory testing may be necessary for epidemiological  and / or  clinical management purposes  to differentiate between  SARS-CoV-2 and other Sarbecovirus currently known to infect humans.  If clinically indicated additional testing with an alternate test  methodology [phone]) is advised. The SARS-CoV-2 RNA is generally  detectable in upper and lower respiratory sp ecimens during the acute  phase of infection. The expected result is Negative. Fact Sheet for Patients:  BoilerBrush.com.cy Fact Sheet for Healthcare Providers: https://pope.com/ This test is not yet approved or cleared by the [place] FDA and has been authorized for detection and/or diagnosis of SARS-CoV-2 by FDA under an Emergency Use Authorization (EUA).  This EUA will remain in effect (meaning this test can be used) for the duration of the COVID-19 declaration under Section 564(b)(1) of the Act, 21 U.S.C. section 360bbb-3(b)(1), unless the authorization is terminated or revoked sooner. Performed at [facility], [address], [place], [place] [address]     Radiology Reports Ct Head Wo Contrast  Result Date: 12/26/2018 CLINICAL DATA:  73 year old male with ataxia, encephalopathy, atrial fibrillation. EXAM: CT HEAD WITHOUT CONTRAST TECHNIQUE: Contiguous axial images were obtained from the base of the skull through the vertex without intravenous contrast. COMPARISON:  None. FINDINGS: Brain: Cerebral volume is within normal limits for age. No midline shift, ventriculomegaly, mass effect, evidence of mass lesion, intracranial hemorrhage or evidence of cortically based acute infarction. Patchy periatrial white matter hypodensity. No  cortical encephalomalacia. Vascular: Calcified atherosclerosis at the skull base. No suspicious intracranial vascular hyperdensity. Skull: Negative. Sinuses/Orbits: Visualized paranasal sinuses and mastoids are well pneumatized. Other: Negative orbits. Visualized scalp soft tissues are within normal limits.  IMPRESSION: 1. No acute intracranial abnormality. 2. Mild for age nonspecific cerebral white matter changes, most commonly due to small vessel disease. Electronically Signed   By: Odessa FlemingH  Hall M.D.   On: 12/26/2018 01:49   Dg Chest Port 1 View  Result Date: 01/05/2019 CLINICAL DATA:  73 year old with dyspnea. EXAM: PORTABLE CHEST 1 VIEW COMPARISON:  12/31/2018 and 07/30/19 FINDINGS: Residual interstitial densities in the left upper lobe. Persistent densities in the right lower chest compatible with pleural fluid and atelectasis/consolidation. Increased densities at the left lung base compatible with increasing left pleural fluid. There is a left arm PICC line with the catheter tip near the SVC and right atrium junction. Heart size is upper limits of normal but stable. IMPRESSION: 1. Increased densities at the left lung base are compatible with small left pleural effusion with left basilar atelectasis or consolidation. 2. Persistent right basilar densities compatible with small right pleural effusion with atelectasis or consolidation. 3. Persistent interstitial densities in left upper lung. Concern for underlying pneumonia. Electronically Signed   By: Richarda OverlieAdam  Henn M.D.   On: 01/05/2019 12:06   Dg Chest Port 1 View  Result Date: 12/31/2018 CLINICAL DATA:  Dyspnea at rest EXAM: PORTABLE CHEST 1 VIEW COMPARISON:  07/30/19 chest radiograph. FINDINGS: Stable cardiomediastinal silhouette with normal heart size. No pneumothorax. No left pleural effusion. Chronic blunting of the right costophrenic angle back to 11/26/2018. Hazy upper left lung and right lung base opacity is stable. New left retrocardiac consolidation. IMPRESSION: 1. New left retrocardiac consolidation. Stable hazy upper left lung and right lung base opacity. Findings suggest multilobar pneumonia. 2. Chronic blunting of the right costophrenic angle back to 11/26/2026 radiograph, compatible with small pleural effusion and/or pleural-parenchymal scarring.  Electronically Signed   By: Delbert PhenixJason A Poff M.D.   On: 12/31/2018 15:01   Dg Chest Port 1 View  Result Date: 2019-06-14 CLINICAL DATA:  73 year old male with sepsis. EXAM: PORTABLE CHEST 1 VIEW COMPARISON:  11/26/2018 and earlier. FINDINGS: Portable AP supine view at 2057 hours. Stable large lung volumes. Stable cardiac size and mediastinal contours. Visualized tracheal air column is within normal limits. No pneumothorax, pleural effusion or consolidation. New asymmetric reticulonodular opacity in the left upper lung. No acute osseous abnormality identified. IMPRESSION: New left upper lobe reticulonodular pulmonary opacity suspicious for acute viral/atypical respiratory infection. Underlying pulmonary hyperinflation. Electronically Signed   By: Odessa FlemingH  Hall M.D.   On: 07/30/19 22:02   [place] Ekg Site Rite  Result Date: 01/01/2019 If Site Rite image not attached, placement could not be confirmed due to current cardiac rhythm.   Lab Data:  CBC: Recent Labs  Lab 12/31/18 0403 01/01/19 0454 01/03/19 0638  WBC 8.7 7.9 7.4  NEUTROABS  --   --  6.8  HGB 8.2* 8.5* 8.6*  HCT 28.6* 30.2* 30.1*  MCV 101.4* 101.0* 102.4*  PLT 176 185 179   Basic Metabolic Panel: Recent Labs  Lab 12/31/18 0403 01/01/19 0454 01/03/19 0638 01/05/19 0657  NA 142 144 141 143  K 4.4 4.1 4.8 4.2  CL 93* 95* 90* 81*  CO2 40* 44* 46* >50*  GLUCOSE 123* 72 135* 122*  BUN 18 18 16 16   CREATININE 0.49* 0.47* 0.56* 0.45*  CALCIUM 9.3 9.2 8.9 9.1  MG 1.8 1.8  --   --  GFR: CrCl cannot be calculated (Unknown ideal weight.). Liver Function Tests: No results for input(s): AST, ALT, ALKPHOS, BILITOT, PROT, ALBUMIN in the last 168 hours. No results for input(s): LIPASE, AMYLASE in the last 168 hours. No results for input(s): AMMONIA in the last 168 hours. Coagulation Profile: No results for input(s): INR, PROTIME in the last 168 hours. Cardiac Enzymes: No results for input(s): CKTOTAL, CKMB, CKMBINDEX, TROPONINI in the  last 168 hours. BNP (last 3 results) No results for input(s): PROBNP in the last 8760 hours. HbA1C: No results for input(s): HGBA1C in the last 72 hours. CBG: Recent Labs  Lab 01/05/19 2006 01/06/19 0016 01/06/19 0506 01/06/19 0748 01/06/19 1155  GLUCAP 327* 207* 137* 152* 258*   Lipid Profile: No results for input(s): CHOL, HDL, LDLCALC, TRIG, CHOLHDL, LDLDIRECT in the last 72 hours. Thyroid Function Tests: No results for input(s): TSH, T4TOTAL, FREET4, T3FREE, THYROIDAB in the last 72 hours. Anemia Panel: No results for input(s): VITAMINB12, FOLATE, FERRITIN, TIBC, IRON, RETICCTPCT in the last 72 hours. Urine analysis:    Component Value Date/Time   COLORURINE AMBER (A) 01-13-19 2036   APPEARANCEUR CLOUDY (A) 2019-01-13 2036   LABSPEC 1.020 2019/01/13 2036   PHURINE 5.0 01-13-19 2036   GLUCOSEU >=500 (A) 01-13-2019 2036   HGBUR NEGATIVE 01/13/19 2036   BILIRUBINUR NEGATIVE 01-13-19 2036   KETONESUR NEGATIVE Jan 13, 2019 2036   PROTEINUR 100 (A) 01/13/2019 2036   NITRITE NEGATIVE 01-13-2019 2036   LEUKOCYTESUR NEGATIVE 2019-01-13 2036     Bayler Gehrig M.D. [facility] 01/06/2019, 3:08 PM  Pager: [phone] Between 7am to 7pm - call Pager - [phone]  After 7pm go to [email or web address] - password [email or web address]  Call night coverage person covering after 7pm

## 2019-01-06 NOTE — Progress Notes (Signed)
Name: Gregory Stone MRN: [id number] DOB: 1946-05-15    ADMISSION DATE:  Feb 08, 2019 CONSULTATION DATE: 01/05/2019  REFERRING MD : [facility]  CHIEF COMPLAINT: Hypoxia  BRIEF PATIENT DESCRIPTION: Elderly male acute on chronic respiratory failure he was apparently failing at this time.  SIGNIFICANT EVENTS  01/05/2019 pulmonary critical care consulted for increasing respiratory distress  STUDIES:  01/05/2019 chest x-ray consistent with edema and COPD   HISTORY OF PRESENT ILLNESS:   73 year old male who was admitted on 12/26/2018 after being brought in from skilled nursing facility with increasing hypoxia.  Rigidly treated as sepsis but with a normal procalcitonin, normal white count and normal tensive.  He is on chronic steroids.  Chronic bronchodilators.  Reported to be on oxygen 24/7 at 4 L nasal cannula.  He was treated aggressively for pneumonia with antibiotics and aggressive diuresis.  He did improve somewhat but now has worsened and is in acute respiratory distress.  He is a DNR this precludes any type of intubation which would probably be futile at this point.  He was tried off BiPAP improperly when acute respiratory failure.  We can try to continue his diuresis, I note that does had no 2D echoes on chart this may be a component of heart failure along with respiratory failure.  We will increase his steroids to Solu-Medrol although I do not hear any bronchospasm at this time.  He may benefit from palliative care consult along with judicious narcotics continue decrease his anxiety.  PAST MEDICAL HISTORY :   has a past medical history of A-fib (HCC), COPD (chronic obstructive pulmonary disease) (HCC), Depression, Diabetes mellitus without complication (HCC), and HLD (hyperlipidemia).  has no past surgical history on file.  SUBJECTIVE:  Elderly male appears older than stated age his decreased respiratory distress today after being aggressively diuresed. VITAL SIGNS: Temp:  [98 F (36.7 C)-98.1  F (36.7 C)] 98 F (36.7 C) (05/09 0546) Pulse Rate:  [83-90] 90 (05/08 2029) Resp:  [22-32] 22 (05/09 0546) BP: (109-141)/(81-88) 141/88 (05/09 0546) SpO2:  [95 %-100 %] 95 % (05/09 0833) FiO2 (%):  [40 %] 40 % (05/08 2018) Weight:  [62.4 kg] 62.4 kg (05/09 0546)  PHYSICAL EXAMINATION: General: Improved from from previous exam on 01/05/2019 HEENT: No JVD or lymphadenopathy is appreciated Neuro: Awake alert able to follow commands CV: Heart sounds are distant  PULM: Decreased air movement but currently on 4 L nasal cannula with O2 sats of approximately 100% WU:JWJXGI:soft, non-tender, bsx4 active  Extremities: warm/dry, 1+ edema  Skin: Reported to have sacral decubitus   Recent Labs  Lab 01/01/19 0454 01/03/19 0638 01/05/19 0657  NA 144 141 143  K 4.1 4.8 4.2  CL 95* 90* 81*  CO2 44* 46* >50*  BUN 18 16 16   CREATININE 0.47* 0.56* 0.45*  GLUCOSE 72 135* 122*   Recent Labs  Lab 12/31/18 0403 01/01/19 0454 01/03/19 0638  HGB 8.2* 8.5* 8.6*  HCT 28.6* 30.2* 30.1*  WBC 8.7 7.9 7.4  PLT 176 185 179   Dg Chest Port 1 View  Result Date: 01/05/2019 CLINICAL DATA:  73 year old with dyspnea. EXAM: PORTABLE CHEST 1 VIEW COMPARISON:  12/31/2018 and 0Jun 11, 2020 FINDINGS: Residual interstitial densities in the left upper lobe. Persistent densities in the right lower chest compatible with pleural fluid and atelectasis/consolidation. Increased densities at the left lung base compatible with increasing left pleural fluid. There is a left arm PICC line with the catheter tip near the SVC and right atrium junction. Heart size is upper limits  of normal but stable. IMPRESSION: 1. Increased densities at the left lung base are compatible with small left pleural effusion with left basilar atelectasis or consolidation. 2. Persistent right basilar densities compatible with small right pleural effusion with atelectasis or consolidation. 3. Persistent interstitial densities in left upper lung. Concern for  underlying pneumonia. Electronically Signed   By: Richarda Overlie M.D.   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  He is also steroid-dependent and prednisone 20 mg daily a skilled nursing facility currently on 40 mg.  He is required noninvasive mechanical ventilatory support despite aggressive diuresis.  Currently off noninvasive mechanical ventilatory support and is in no acute distress He is apparently tolerating the aggressive diuresis.  Noted to be -3 L over the last 24 hours O2 as tolerated Continue antimicrobial antimicrobial therapy Currently on Solu-Medrol 60 mg every 6 hours can start weaning Continue bronchodilators PRN and nocturnal noninvasive mechanical ventilatory support Palliative care consult would be appropriate  Diabetes mellitus which will worsen with IV steroids CBG (last 3)  Recent Labs    01/06/19 0016 01/06/19 0506 01/06/19 0748  GLUCAP 207* 137* 152*    Sliding scale insulin Wean steroids  Chronic atrial fibrillation No documented 2D echo 2D echo was noted with sub-preserved LV function Control rate Currently on Eliquis  Discussion: 73 year old nursing home patient is O2 dependent at baseline and barely able to ambulate without assistance.  He may be near end-stage COPD he is a full DNR very little offer other than steroids, antibiotics, oxygen and noninvasive mechanical ventilatory support and aggressive diuresis.Marland Kitchen  He may benefit from palliative care consult and application of judicious steroids to turn down his anxiety thermostat. 01/06/2019 he is currently off BiPAP.  Is on 6 L nasal cannula and appears to be doing well.  Able to  phonate carry on conversation without being short of breath.   Brett Canales Emanuelle Bastos ACNP [facility] Pager [phone] till 1 pm If no answer page [phone] 01/06/2019, 9:39 AM  01/06/2019, 9:39 AM

## 2019-01-07 LAB — GLUCOSE, CAPILLARY
Glucose-Capillary: 149 mg/dL — ABNORMAL HIGH (ref 70–99)
Glucose-Capillary: 140 mg/dL — ABNORMAL HIGH (ref 70–99)
Glucose-Capillary: 161 mg/dL — ABNORMAL HIGH (ref 70–99)
Glucose-Capillary: 138 mg/dL — ABNORMAL HIGH (ref 70–99)
Glucose-Capillary: 159 mg/dL — ABNORMAL HIGH (ref 70–99)

## 2019-01-07 NOTE — Progress Notes (Signed)
[facility]                                                                              Patient Demographics  Gregory Stone, is a 73 y.o. 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  Per report, he had well shortness of breath in the facility, found to have O2 sats in 50s.  He was given breathing treatments and had also been treated with a dose of Ativan then patient became minimally responsive.  In ED he was barely arousable.  In ED patient was found to have negative COVID test, negative flu test WBC is 12.3, lactic acid 4.9, troponin  0.03, UA suggestive of UTI.  A. fib with RVR, O2 sats 100% on 5 L. ABG showed pH of 7.18, PCO2 103, PO2 142.  Chest x-ray showed left upper lobe infiltrates.  CT head negative.   Assessment & Plan    Principal Problem:   HCAP (healthcare-associated pneumonia)/sepsis with COPD exacerbation, acute hypercarbic and hypoxic respiratory failure -Patient had been progressively improving, until he was scheduled for discharge on Sunday, 5/3.   repeat chest x-ray showed multifocal pneumonia, COVID test repeated was negative on 5/5 -RVP panel showed positive coronavirus H KU 1. Urine strep antigen, Legionella antigen negative -Patient was again scheduled for discharge on 01/05/2019 when he became hypoxic and was placed on BiPAP again.   -CCM was consulted, appreciate recommendations.  Palliative also  consulted for goals of care however unable to make contact with the family -Continue Lasix, oral prednisone.   2D echo showed EF of 55 to 60%, possible hypokinesis of left ventricular basal-mid inferior wall. -Awaiting palliative goals of care, overnight on BiPAP  Active Problems: Acute metabolic encephalopathy -Likely due to hypercarbic and hypoxic respiratory failure,  -Improving  Atrial fibrillation with RVR -Most likely due to sepsis, #1 -Currently normal sinus rhythm -Continue Eliquis, CHADVASC 2  Elevated troponin -Likely due to demand ischemia due to #1 -Currently no chest pain.  EKG showed sinus tachycardia no acute ST-T wave changes suggestive of ACS  Diabetes mellitus type 2 uncontrolled with hyperglycemia -Hemoglobin A1c 7.6 -Continue oral prednisone 10 mg daily  -Continue Lantus, NovoLog meal coverage, sliding scale insulin.  CBGs improving  Lactic acidosis Resolved  Hyperlipidemia Continue Lipitor  History of depression and anxiety Hold Ativan  continue Zoloft  Abnormal LFTs Hepatitis panel negative   Code Status: DNR DVT Prophylaxis: Apixaban Family Communication: Discussed in detail with the patient, all imaging results, lab results explained to the patient.    Disposition Plan: Await goals of care.  Per patient's  skilled nursing facility, has to be off BiPAP for 24 hours before he can be accepted back.  Time Spent in minutes 25 minutes  Procedures:  BiPAP  Consultants:   Pulmonary critical care Palliative medicine  Antimicrobials:   Anti-infectives (From admission, onward)   Start     Dose/Rate Route Frequency Ordered Stop   01/05/19 0000  amoxicillin-clavulanate (AUGMENTIN) 875-125 MG tablet     1 tablet Oral 2 times daily 01/05/19 0930 01/08/19 2359   12/31/18 1600  piperacillin-tazobactam (ZOSYN) IVPB 3.375 g     3.375 g 12.5 mL/hr over 240 Minutes Intravenous Every 8 hours 12/31/18 1525 01/07/19 1359   12/31/18 0000  cefdinir (OMNICEF)  300 MG capsule  Status:  Discontinued     300 mg Oral 2 times daily 12/31/18 1041 01/04/19    12/31/18 0000  azithromycin (ZITHROMAX) 500 MG tablet  Status:  Discontinued     500 mg Oral Daily 12/31/18 1041 01/04/19    12/26/18 2200  vancomycin (VANCOCIN) IVPB 1000 mg/200 mL premix  Status:  Discontinued     1,000 mg 200 mL/hr over 60 Minutes Intravenous Every 24 hours 12/26/18 0027 12/26/18 1301   12/26/18 1430  cefTRIAXone (ROCEPHIN) 2 g in sodium chloride 0.9 % 100 mL IVPB  Status:  Discontinued     2 g 200 mL/hr over 30 Minutes Intravenous Every 24 hours 12/26/18 1301 12/31/18 1525   12/26/18 0600  piperacillin-tazobactam (ZOSYN) IVPB 3.375 g  Status:  Discontinued     3.375 g 12.5 mL/hr over 240 Minutes Intravenous Every 8 hours 12/26/18 0027 12/26/18 1301   12/26/18 0030  azithromycin (ZITHROMAX) 500 mg in sodium chloride 0.9 % 250 mL IVPB  Status:  Discontinued     500 mg 250 mL/hr over 60 Minutes Intravenous Daily at bedtime 12/26/18 0013 12/31/18 1530   Apr 23, 2019 2030  vancomycin (VANCOCIN) IVPB 1000 mg/200 mL premix     1,000 mg 200 mL/hr over 60 Minutes Intravenous  Once Apr 23, 2019 2029 Apr 23, 2019 2208   Apr 23, 2019 2030  piperacillin-tazobactam (ZOSYN) IVPB 3.375 g     3.375 g 100 mL/hr over 30 Minutes Intravenous  Once Apr 23, 2019 2029 Apr 23, 2019 2135         Medications  Scheduled Meds:  apixaban  5 mg Oral BID   aspirin EC  81 mg Oral Daily   atorvastatin  20 mg Oral q1800   dextromethorphan-guaiFENesin  1 tablet Oral BID   famotidine  20 mg Oral Daily   feeding supplement (PRO-STAT SUGAR FREE 64)  30 mL Oral BID   fluticasone  2 spray Each Nare Daily   furosemide  40 mg Oral Daily   insulin aspart  0-9 Units Subcutaneous Q4H   insulin aspart  3 Units Subcutaneous TID WC   insulin glargine  5 Units Subcutaneous QHS   ipratropium-albuterol  3 mL Nebulization TID   predniSONE  10 mg Oral Q breakfast   Ensure Max Protein  11 oz Oral Daily   sertraline  50 mg  Oral Daily   sodium chloride flush  10-40 mL Intracatheter Q12H   Continuous Infusions:  PRN Meds:.albuterol, hydrALAZINE, ipratropium-albuterol, ondansetron (ZOFRAN) IV, sodium chloride, sodium chloride flush      Subjective:   Janine OresJoshua Barstow was seen and examined today.  Review of system from the patient, was placed on BiPAP overnight.  This morning sats 100% on 4 L O2.   No fever chills, chest pain or shortness of breath.  Denies any dizziness, nausea vomiting, abdominal pain.  Objective:   Vitals:   01/06/19 2333 01/07/19 0411 01/07/19 0911 01/07/19 1358  BP: 111/78 118/73    Pulse: 84 82    Resp: (!) 34 (!) 24    Temp:  98.2 F (36.8 C)    TempSrc:  Axillary    SpO2: 100% 99% 100% 100%  Weight:  62.7 kg      Intake/Output Summary (Last 24 hours) at 01/07/2019 1453 Last data filed at 01/07/2019 1220 Gross per 24 hour  Intake 220.4 ml  Output 1175 ml  Net -954.6 ml     Wt Readings from Last 3 Encounters:  01/07/19 62.7 kg   Physical Exam  General: Confused  Eyes:  HEENT:  Atraumatic, normocephalic  Cardiovascular: S1 S2 clear, RRR. No pedal edema b/l  Respiratory: Decreased breath sound at the bases  Gastrointestinal: Soft, nontender, nondistended, NBS  Ext: no pedal edema bilaterally  Neuro: no new deficits  Musculoskeletal: No cyanosis, clubbing  Skin: No rashes  Psych: Alert and oriented x1 to self      Data Reviewed:  I have personally reviewed following labs and imaging studies  Micro Results Recent Results (from the past 240 hour(s))  Culture, blood (routine x 2)     Status: None   Collection Time: 12/30/18  9:30 AM  Result Value Ref Range Status   Specimen Description BLOOD RIGHT THUMB  Final   Special Requests   Final    BOTTLES DRAWN AEROBIC ONLY Blood Culture results may not be optimal due to an inadequate volume of blood received in culture bottles   Culture   Final    NO GROWTH 5 DAYS Performed at [facility],  [address] [place], [place] [address]    Report Status 01/04/2019 FINAL  Final  Culture, blood (routine x 2)     Status: None   Collection Time: 12/30/18  4:32 PM  Result Value Ref Range Status   Specimen Description BLOOD SITE NOT SPECIFIED  Final   Special Requests AEROBIC BOTTLE ONLY Blood Culture adequate volume  Final   Culture   Final    NO GROWTH 5 DAYS Performed at [facility], [address] [place], [place] [address]    Report Status 01/04/2019 FINAL  Final  Respiratory Panel by PCR     Status: None   Collection Time: 01/02/19  9:24 AM  Result Value Ref Range Status   Adenovirus NOT DETECTED NOT DETECTED Final   Coronavirus 229E NOT DETECTED NOT DETECTED Final    Comment: (NOTE) The Coronavirus on the Respiratory Panel, DOES NOT test for the novel  Coronavirus (2019 nCoV)    Coronavirus HKU1 NOT DETECTED NOT DETECTED Final   Coronavirus NL63 NOT DETECTED NOT DETECTED Final   Coronavirus OC43 NOT DETECTED NOT DETECTED Final   Metapneumovirus NOT DETECTED NOT DETECTED Final   Rhinovirus / Enterovirus NOT DETECTED NOT DETECTED Final   Influenza A NOT DETECTED NOT DETECTED Final   Influenza B NOT DETECTED NOT DETECTED Final   Parainfluenza Virus 1 NOT DETECTED NOT DETECTED Final   Parainfluenza Virus 2 NOT DETECTED NOT DETECTED Final   Parainfluenza Virus 3 NOT DETECTED NOT DETECTED Final   Parainfluenza Virus 4 NOT DETECTED NOT DETECTED Final   Respiratory Syncytial Virus NOT DETECTED NOT DETECTED Final   Bordetella pertussis NOT DETECTED NOT DETECTED Final   Chlamydophila pneumoniae NOT DETECTED NOT DETECTED Final   Mycoplasma pneumoniae NOT DETECTED NOT DETECTED Final    Comment: Performed at [facility], [address], [place], [place] [address]  SARS Coronavirus 2 (CEPHEID - Performed in [facility] hospital lab), Hosp Order     Status: None   Collection Time: 01/02/19  9:24 AM  Result Value Ref Range Status   SARS Coronavirus 2 NEGATIVE NEGATIVE  Final    Comment: (NOTE) If result is NEGATIVE SARS-CoV-2 target nucleic acids are NOT DETECTED. The SARS-CoV-2 RNA is generally detectable in upper and lower  respiratory specimens during the acute phase of infection. The lowest  concentration of SARS-CoV-2 viral copies this assay can detect is 250  copies / mL. A negative result does not preclude SARS-CoV-2 infection  and should not be used as the sole basis for treatment or other  patient management decisions.  A negative result may occur with  improper specimen collection / handling, submission of specimen other  than nasopharyngeal swab, presence of viral mutation(s) within the  areas targeted by this assay, and inadequate number of viral copies  (<250 copies / mL). A negative result must be combined with clinical  observations, patient history, and epidemiological information. If result is POSITIVE SARS-CoV-2 target nucleic acids are DETECTED. The SARS-CoV-2 RNA is generally detectable in upper and lower  respiratory specimens dur ing the acute phase of infection.  Positive  results are indicative of active infection with SARS-CoV-2.  Clinical  correlation with patient history and other diagnostic information is  necessary to determine patient infection status.  Positive results do  not rule out bacterial infection or co-infection with other viruses. If result is PRESUMPTIVE POSTIVE SARS-CoV-2 nucleic acids MAY BE PRESENT.   A presumptive positive result was obtained on the submitted specimen  and confirmed on repeat testing.  While 2019 novel coronavirus  (SARS-CoV-2) nucleic acids may be present in the submitted sample  additional confirmatory testing may be necessary for epidemiological  and / or clinical management purposes  to differentiate between  SARS-CoV-2 and other Sarbecovirus currently known to infect humans.  If clinically indicated additional testing with an alternate test  methodology [phone]) is advised. The  SARS-CoV-2 RNA is generally  detectable in upper and lower respiratory sp ecimens during the acute  phase of infection. The expected result is Negative. Fact Sheet for Patients:  BoilerBrush.com.cy Fact Sheet for Healthcare Providers: https://pope.com/ This test is not yet approved or cleared by the [place] FDA and has been authorized for detection and/or diagnosis of SARS-CoV-2 by FDA under an Emergency Use Authorization (EUA).  This EUA will remain in effect (meaning this test can be used) for the duration of the COVID-19 declaration under Section 564(b)(1) of the Act, 21 U.S.C. section 360bbb-3(b)(1), unless the authorization is terminated or revoked sooner. Performed at [facility], [address], [place], [place] [address]     Radiology Reports Ct Head Wo Contrast  Result Date: 12/26/2018 CLINICAL DATA:  73 year old male with ataxia, encephalopathy, atrial fibrillation. EXAM: CT HEAD WITHOUT CONTRAST TECHNIQUE: Contiguous axial images were obtained from the base of the skull through the vertex without intravenous contrast. COMPARISON:  None. FINDINGS: Brain: Cerebral volume is within normal limits for age. No midline shift, ventriculomegaly, mass effect, evidence of mass lesion, intracranial hemorrhage or evidence of cortically based acute infarction. Patchy periatrial white matter hypodensity. No cortical encephalomalacia. Vascular: Calcified atherosclerosis at the skull base. No suspicious intracranial vascular hyperdensity. Skull: Negative. Sinuses/Orbits: Visualized paranasal sinuses and mastoids are well pneumatized. Other: Negative orbits. Visualized scalp soft tissues are within normal limits. IMPRESSION: 1.  No acute intracranial abnormality. 2. Mild for age nonspecific cerebral white matter changes, most commonly due to small vessel disease. Electronically Signed   By: Odessa Fleming M.D.   On: 12/26/2018 01:49   Dg Chest  Port 1 View  Result Date: 01/05/2019 CLINICAL DATA:  73 year old with dyspnea. EXAM: PORTABLE CHEST 1 VIEW COMPARISON:  12/31/2018 and 09-Jan-2019 FINDINGS: Residual interstitial densities in the left upper lobe. Persistent densities in the right lower chest compatible with pleural fluid and atelectasis/consolidation. Increased densities at the left lung base compatible with increasing left pleural fluid. There is a left arm PICC line with the catheter tip near the SVC and right atrium junction. Heart size is upper limits of normal but stable. IMPRESSION: 1. Increased densities at the left lung base are compatible with small left pleural effusion with left basilar atelectasis or consolidation. 2. Persistent right basilar densities compatible with small right pleural effusion with atelectasis or consolidation. 3. Persistent interstitial densities in left upper lung. Concern for underlying pneumonia. Electronically Signed   By: Richarda Overlie M.D.   On: 01/05/2019 12:06   Dg Chest Port 1 View  Result Date: 12/31/2018 CLINICAL DATA:  Dyspnea at rest EXAM: PORTABLE CHEST 1 VIEW COMPARISON:  2019/01/09 chest radiograph. FINDINGS: Stable cardiomediastinal silhouette with normal heart size. No pneumothorax. No left pleural effusion. Chronic blunting of the right costophrenic angle back to 11/26/2018. Hazy upper left lung and right lung base opacity is stable. New left retrocardiac consolidation. IMPRESSION: 1. New left retrocardiac consolidation. Stable hazy upper left lung and right lung base opacity. Findings suggest multilobar pneumonia. 2. Chronic blunting of the right costophrenic angle back to 11/26/2026 radiograph, compatible with small pleural effusion and/or pleural-parenchymal scarring. Electronically Signed   By: Delbert Phenix M.D.   On: 12/31/2018 15:01   Dg Chest Port 1 View  Result Date: Jan 09, 2019 CLINICAL DATA:  73 year old male with sepsis. EXAM: PORTABLE CHEST 1 VIEW COMPARISON:  11/26/2018 and earlier.  FINDINGS: Portable AP supine view at 2057 hours. Stable large lung volumes. Stable cardiac size and mediastinal contours. Visualized tracheal air column is within normal limits. No pneumothorax, pleural effusion or consolidation. New asymmetric reticulonodular opacity in the left upper lung. No acute osseous abnormality identified. IMPRESSION: New left upper lobe reticulonodular pulmonary opacity suspicious for acute viral/atypical respiratory infection. Underlying pulmonary hyperinflation. Electronically Signed   By: Odessa Fleming M.D.   On: 01/09/19 22:02   [place] Ekg Site Rite  Result Date: 01/01/2019 If Site Rite image not attached, placement could not be confirmed due to current cardiac rhythm.   Lab Data:  CBC: Recent Labs  Lab 01/01/19 0454 01/03/19 0638  WBC 7.9 7.4  NEUTROABS  --  6.8  HGB 8.5* 8.6*  HCT 30.2* 30.1*  MCV 101.0* 102.4*  PLT 185 179   Basic Metabolic Panel: Recent Labs  Lab 01/01/19 0454 01/03/19 0638 01/05/19 0657  NA 144 141 143  K 4.1 4.8 4.2  CL 95* 90* 81*  CO2 44* 46* >50*  GLUCOSE 72 135* 122*  BUN CREATININE 0.47* 0.56* 0.45*  CALCIUM 9.2 8.9 9.1  MG 1.8  --   --    GFR: CrCl cannot be calculated (Unknown ideal weight.). Liver Function Tests: No results for input(s): AST, ALT, ALKPHOS, BILITOT, PROT, ALBUMIN in the last 168 hours. No results for input(s): LIPASE, AMYLASE in the last 168 hours. No results for input(s): AMMONIA in the last 168 hours. Coagulation Profile: No results for input(s): INR, PROTIME  in the last 168 hours. Cardiac Enzymes: No results for input(s): CKTOTAL, CKMB, CKMBINDEX, TROPONINI in the last 168 hours. BNP (last 3 results) No results for input(s): PROBNP in the last 8760 hours. HbA1C: No results for input(s): HGBA1C in the last 72 hours. CBG: Recent Labs  Lab 01/06/19 2015 01/06/19 2329 01/07/19 0408 01/07/19 0735 01/07/19 1218  GLUCAP 99 201* 149* 140* 161*   Lipid Profile: No results for  input(s): CHOL, HDL, LDLCALC, TRIG, CHOLHDL, LDLDIRECT in the last 72 hours. Thyroid Function Tests: No results for input(s): TSH, T4TOTAL, FREET4, T3FREE, THYROIDAB in the last 72 hours. Anemia Panel: No results for input(s): VITAMINB12, FOLATE, FERRITIN, TIBC, IRON, RETICCTPCT in the last 72 hours. Urine analysis:    Component Value Date/Time   COLORURINE AMBER (A) 01-24-19 2036   APPEARANCEUR CLOUDY (A) 01/24/19 2036   LABSPEC 1.020 Jan 24, 2019 2036   PHURINE 5.0 01-24-19 2036   GLUCOSEU >=500 (A) 01-24-19 2036   HGBUR NEGATIVE 2019-01-24 2036   BILIRUBINUR NEGATIVE 01/24/19 2036   KETONESUR NEGATIVE 2019-01-24 2036   PROTEINUR 100 (A) Jan 24, 2019 2036   NITRITE NEGATIVE 24-Jan-2019 2036   LEUKOCYTESUR NEGATIVE 01-24-19 2036     Michal Callicott M.D. [facility] 01/07/2019, 2:53 PM  Pager: [phone] Between 7am to 7pm - call Pager - [phone]  After 7pm go to [email or web address] - password [email or web address]  Call night coverage person covering after 7pm

## 2019-01-07 NOTE — Progress Notes (Signed)
Patient confused about whereabouts, easily reoriented. Patient trying to take BIPAP off, educated about the importance. Will continue to monitor patient.

## 2019-01-08 DIAGNOSIS — J9621 Acute and chronic respiratory failure with hypoxia: Secondary | ICD-10-CM

## 2019-01-08 DIAGNOSIS — Z515 Encounter for palliative care: Secondary | ICD-10-CM

## 2019-01-08 DIAGNOSIS — J9622 Acute and chronic respiratory failure with hypercapnia: Secondary | ICD-10-CM

## 2019-01-08 LAB — GLUCOSE, CAPILLARY
Glucose-Capillary: 108 mg/dL — ABNORMAL HIGH (ref 70–99)
Glucose-Capillary: 127 mg/dL — ABNORMAL HIGH (ref 70–99)
Glucose-Capillary: 143 mg/dL — ABNORMAL HIGH (ref 70–99)
Glucose-Capillary: 209 mg/dL — ABNORMAL HIGH (ref 70–99)
Glucose-Capillary: 270 mg/dL — ABNORMAL HIGH (ref 70–99)
Glucose-Capillary: 69 mg/dL — ABNORMAL LOW (ref 70–99)
Glucose-Capillary: 98 mg/dL (ref 70–99)

## 2019-01-08 MED ORDER — MORPHINE SULFATE (PF) 2 MG/ML IV SOLN
1.0000 mg | INTRAVENOUS | Status: DC | PRN
  Administered 2019-01-11 – 2019-01-12 (×2): 2 mg via INTRAVENOUS
  Administered 2019-01-12 – 2019-01-13 (×2): 1 mg via INTRAVENOUS
  Filled 2019-01-08 (×5): qty 1

## 2019-01-08 NOTE — TOC Progression Note (Signed)
Transition of Care [facility]) - Progression Note    Patient Details  Name: Gregory Stone MRN: [id number] Date of Birth: 06-18-1946  Transition of Care [facility]) CM/SW Contact  Doy Hutching, [place] Phone Number: 01/08/2019, 2:34 PM  Clinical Narrative:    Await medical stability, if off bipap and tolerating possible d/c tomorrow back to [facility].    Expected Discharge Plan: Skilled Nursing Facility Barriers to Discharge: No Barriers Identified  Expected Discharge Plan and Services Expected Discharge Plan: Skilled Nursing Facility In-house Referral: Clinical Social Work Discharge Planning Services: NA Post Acute Care Choice: Skilled Nursing Facility Living arrangements for the past 2 months: Skilled Nursing Facility Expected Discharge Date: 01/05/19               DME Arranged: N/A DME Agency: NA       HH Arranged: NA HH Agency: NA         Social Determinants of Health (SDOH) Interventions    Readmission Risk Interventions Readmission Risk Prevention Plan 12/29/2018  Transportation Screening Complete  PCP or Specialist Appt within 5-7 Days Complete  Home Care Screening Complete  Medication Review (RN CM) Complete

## 2019-01-08 NOTE — Progress Notes (Signed)
Nutrition Follow-up  INTERVENTION:   -Continue Ensure MAX Protein po BID, each supplement provides 150 kcal and 30 grams of protein -Continue Prostat liquid protein PO 30 ml BID with meals, each supplement provides 100 kcal, 15 grams protein.  NUTRITION DIAGNOSIS:   Increased nutrient needs related to wound healing as evidenced by estimated needs.  Ongoing.  GOAL:   Patient will meet greater than or equal to 90% of their needs  Progressing.  MONITOR:   PO intake, Supplement acceptance, Labs, Weight trends, I & O's  ASSESSMENT:   73 y.o. male with medical history significant of hyperlipidemia, diabetes mellitus, COPD, atrial fibrillation on Eliquis, depression, who presents with altered mental status and shortness of breath. Admitted for Sepsis and acute on chronic respiratory failure with hypoxia and hypercapnia due to HCAP and COPD exacerbation.   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 Pt requiring Bi-pap at night. Palliative care is following for GOC decisions, will monitor.   Patient's weight has decreased closer to admission weight (136 lb).  Medications: Lasix tablet daily  Labs reviewed: CBGs: 108-143  Diet Order:   Diet Order            Diet Carb Modified        Diet Carb Modified Fluid consistency: Thin; Room service appropriate? No  Diet effective now        Diet Carb Modified              EDUCATION NEEDS:   No education needs have been identified at this time  Skin:  Skin Assessment: Skin Integrity Issues: Skin Integrity Issues:: Stage III Stage III: sacrum  Last BM:  5/10  Height:   Ht Readings from Last 1 Encounters:  No data found for Ht  Per Care Everywhere  pt was 5'8" on 3/8.  Weight:   Wt Readings from Last 1 Encounters:  01/08/19 62.4 kg    Ideal Body Weight:  70 kg(based on height of 5'8" from 3/8)  BMI: 20.8 kg/m^2  Estimated Nutritional Needs:   Kcal:  1900-2100  Protein:  90-100g  Fluid:  1.9L/day  Tilda Franco, MS, RD, LDN [facility] Inpatient Clinical Dietitian Pager: [phone] After Hours Pager: [phone]

## 2019-01-08 NOTE — Consult Note (Signed)
Consultation Note Date: 01/08/2019   Patient Name: Gregory Stone  DOB: [birth year]  MRN: [id number]  Age / Sex: 73 y.o., male  PCP: Patient, No Pcp Per Referring Physician: Cathren Harshai, Ripudeep K, MD  Reason for Consultation: Establishing goals of care  HPI/Patient Profile: 73 y.o. male  with past medical history of COPD on 3L at home, Afib on eliquis, DM, and depression who was admitted on Mar 14, 2019 with AMS and SOB.  Reportedly oxygen sats were 50% at his facility prior to admission.   Gregory Stone was admitted and treated for COPD exacerbation.   Unfortunately he has improved almost to discharge and then relapsed into hypercarbic respiratory failure twice.  Pulmonary has consulted and recommended Palliative care measures.  Clinical Assessment and Goals of Care:  I have reviewed medical records including EPIC notes including my colleague's notes from 5/8 and 5/9, labs and imaging, received report from Dr. Isidoro Donningai, assessed the patient and then spoke on the phone with his son Gregory Stone to discuss diagnosis prognosis, GOC, EOL wishes, disposition and options.  Gregory Stone asked me how serious it is to be on Bipap.  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  His father not only needs Bipap at night but needs PRN during the day as well.  I explained the Gregory Stone that in order to receive that type of care his father would need to remain in the hospital or stay in an LTAC.  I told Gregory Stone that I was concerned his father may not survive the hospitalization - or if he was transferred to an LTAC he may only live weeks even with Bipap support.  Gregory Stone seemed somewhat surprised by this information.    Gregory Stone asked if he could include his sister in our conversation.   We will schedule an appointment for tomorrow when Gregory Stone and his sister can see their father on Facetime and discuss GOC.  The family was encouraged to call with  questions or concerns.    Primary Decision Maker:  NEXT OF KIN Gregory Stone and Gregory Stone.    SUMMARY OF RECOMMENDATIONS    PMT GOC meeting tomorrow with 2 of 5 children  Code Status/Advance Care Planning:  DNR   Symptom Management:   Will add low dose morphine for air hunger  Prognosis:  Weeks to months with continued Bipap support.    Discharge Planning: To Be Determined      Primary Diagnoses: Present on Admission: . Depression . Atrial fibrillation with RVR (HCC) . Acute on chronic respiratory failure with hypoxia (HCC) . HLD (hyperlipidemia) . Acute metabolic encephalopathy . Sepsis (HCC) . Hyperkalemia . Abnormal LFTs . Elevated troponin . HCAP (healthcare-associated pneumonia) . COPD exacerbation (HCC) . COPD (chronic obstructive pulmonary disease) (HCC) . Sepsis due to pneumonia (HCC) . Diabetes mellitus type 2, uncontrolled, with complications (HCC) . Anxiety   I have reviewed the medical record, interviewed the patient and family, and examined the patient. The following aspects are pertinent.  Past Medical History:  Diagnosis Date  . A-fib (HCC)   .  COPD (chronic obstructive pulmonary disease) (HCC)   . Depression   . Diabetes mellitus without complication (HCC)   . HLD (hyperlipidemia)    Social History   Socioeconomic History  . Marital status: Divorced    Spouse name: Not on file  . Number of children: Not on file  . Years of education: Not on file  . Highest education level: Not on file  Occupational History  . Not on file  Social Needs  . Financial resource strain: Not on file  . Food insecurity:    Worry: Not on file    Inability: Not on file  . Transportation needs:    Medical: Not on file    Non-medical: Not on file  Tobacco Use  . Smoking status: Not on file  Substance and Sexual Activity  . Alcohol use: Not on file  . Drug use: Not on file  . Sexual activity: Not on file  Lifestyle  . Physical activity:    Days per week: Not  on file    Minutes per session: Not on file  . Stress: Not on file  Relationships  . Social connections:    Talks on phone: Not on file    Gets together: Not on file    Attends religious service: Not on file    Active member of club or organization: Not on file    Attends meetings of clubs or organizations: Not on file    Relationship status: Not on file  Other Topics Concern  . Not on file  Social History Narrative  . Not on file   No family history on file. Scheduled Meds: . apixaban  5 mg Oral BID  . aspirin EC  81 mg Oral Daily  . atorvastatin  20 mg Oral q1800  . dextromethorphan-guaiFENesin  1 tablet Oral BID  . famotidine  20 mg Oral Daily  . feeding supplement (PRO-STAT SUGAR FREE 64)  30 mL Oral BID  . fluticasone  2 spray Each Nare Daily  . furosemide  40 mg Oral Daily  . insulin aspart  0-9 Units Subcutaneous Q4H  . insulin aspart  3 Units Subcutaneous TID WC  . ipratropium-albuterol  3 mL Nebulization TID  . predniSONE  10 mg Oral Q breakfast  . Ensure Max Protein  11 oz Oral Daily  . sertraline  50 mg Oral Daily  . sodium chloride flush  10-40 mL Intracatheter Q12H   Continuous Infusions: PRN Meds:.albuterol, hydrALAZINE, ipratropium-albuterol, ondansetron (ZOFRAN) IV, sodium chloride, sodium chloride flush Allergies  Allergen Reactions  . Lisinopril Swelling    Angioedema   . Sulfamethoxazole-Trimethoprim Anaphylaxis and Other (See Comments)    Other reaction(s): Muscle Pain   . Ticagrelor Shortness Of Breath       . Trimethoprim Other (See Comments)    Per MAR   Review of Systems patient unable to speak  Physical Exam  Thin well developed male, awake, alert, non-verbal Resp mild retractions noted around clavical area, decreased breath sounds bilaterally.  Patient will not speak CV difficult to hear, no m/r/g Abdomen soft, nt, nd  LE no edema.  Vital Signs: BP 118/79 (BP Location: Right Arm)   Pulse 89   Temp 97.6 F (36.4 C) (Axillary)    Resp (!) 29   Wt 62.4 kg   SpO2 99%  Pain Scale: 0-10 POSS *See Group Information*: 1-Acceptable,Awake and alert Pain Score: 0-No pain   SpO2: SpO2: 99 % O2 Device:SpO2: 99 % O2 Flow Rate: .O2 Flow Rate (  L/min): 4 L/min  IO: Intake/output summary:   Intake/Output Summary (Last 24 hours) at 01/08/2019 1622 Last data filed at 01/08/2019 1426 Gross per 24 hour  Intake 282 ml  Output 3000 ml  Net -2718 ml    LBM: Last BM Date: 01/07/19 Baseline Weight: Weight: 61.7 kg Most recent weight: Weight: 62.4 kg     Palliative Assessment/Data: 20%   Flowsheet Rows     Most Recent Value  Intake Tab  Referral Department  Hospitalist  Unit at Time of Referral  Med/Surg Unit  Date Notified  01/05/19  Palliative Care Type  New Palliative care  Reason for referral  Clarify Goals of Care  Date of Admission  01-05-19  Date first seen by Palliative Care  01/06/19  # of days Palliative referral response time  1 Day(s)  # of days IP prior to Palliative referral  11  Clinical Assessment  Psychosocial & Spiritual Assessment  Palliative Care Outcomes      Time In: 3:30 Time Out: 4:30 Time Total: 60 min. Greater than 50%  of this time was spent counseling and coordinating care related to the above assessment and plan.  Signed by: Norvel Richards, PA-C Palliative Medicine Pager: [phone]  Please contact Palliative Medicine Team phone at [phone] for questions and concerns.  For individual provider: See Loretha Stapler

## 2019-01-08 NOTE — Progress Notes (Signed)
[facility]                                                                              Patient Demographics  Gilmar Bua, is a 73 y.o. 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  Per report, he had well shortness of breath in the facility, found to have O2 sats in 50s.  He was given breathing treatments and had also been treated with a dose of Ativan then patient became minimally responsive.  In ED he was barely arousable.  In ED patient was found to have negative COVID test, negative flu test WBC is 12.3, lactic acid 4.9, troponin  0.03, UA suggestive of UTI.  A. fib with RVR, O2 sats 100% on 5 L. ABG showed pH of 7.18, PCO2 103, PO2 142.  Chest x-ray showed left upper lobe infiltrates.  CT head negative.   Assessment & Plan    Principal Problem:   HCAP (healthcare-associated pneumonia)/sepsis with COPD exacerbation, acute hypercarbic and hypoxic respiratory failure -Patient had been progressively improving, until he was scheduled for discharge on Sunday, 5/3, became hypoxic.  Repeat CXR showed multifocal PNA. COVID test repeated was negative on 5/5 -RVP panel showed positive coronavirus HKU 1. Urine strep antigen, Legionella antigen negative -Patient was again scheduled for discharge on 01/05/2019 when he became hypoxic and was placed on BiPAP again.   Continue Lasix, oral prednisone.   2D echo showed EF of 55 to 60%,  possible hypokinesis of left ventricular basal-mid inferior wall. 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  Palliative consulted for GOC however unable to make contact with the family -I was able to contact patient's son today who is agreeable for palliative discussion for GOC along with his sister.  I have requested palliative to reach out to the family again.  Active Problems: Acute metabolic encephalopathy -Likely due to hypercarbic and hypoxic respiratory failure,  -appears at baseline, improved   Atrial fibrillation with RVR -Most likely due to sepsis, #1 -Currently normal sinus rhythm -Continue Eliquis, CHADVASC 2  Elevated troponin -Likely due to demand ischemia due to #1 -Currently no chest pain.  EKG showed sinus tachycardia, no acute ischemic changes   Diabetes mellitus type 2 uncontrolled with hyperglycemia -Hemoglobin A1c 7.6 -Continue oral prednisone 10 mg daily  -Early this morning hypoglycemia with CBG 69, now in 200s. -Will DC basal insulin, continue NovoLog meal coverage and sliding scale insulin  Lactic acidosis Resolved  Hyperlipidemia Continue Lipitor  History of depression and anxiety -  Hold Ativan - continue Zoloft  Abnormal LFTs Hepatitis panel negative   Code Status: DNR DVT Prophylaxis: Apixaban Family Communication: Discussed in detail with the patient, all imaging results, lab results explained to the patient.  Discussed with patient son on the phone.  Disposition Plan: Await goals of care.  Per patient's skilled nursing facility, has to be off BiPAP for 24 hours before he can be accepted back.  Patient's son is agreeable to palliative GOC with his sister.  Palliative medicine notified.  Time Spent in minutes 25 minutes  Procedures:  BiPAP  Consultants:   Pulmonary critical care Palliative medicine  Antimicrobials:    Anti-infectives (From admission, onward)   Start     Dose/Rate Route Frequency Ordered Stop   01/05/19 0000  amoxicillin-clavulanate (AUGMENTIN) 875-125 MG tablet     1 tablet Oral 2 times daily 01/05/19 0930 01/08/19 2359   12/31/18 1600  piperacillin-tazobactam (ZOSYN) IVPB 3.375 g     3.375 g 12.5 mL/hr over 240 Minutes Intravenous Every 8 hours 12/31/18 1525 01/07/19 1359   12/31/18 0000  cefdinir (OMNICEF) 300 MG capsule  Status:  Discontinued     300 mg Oral 2 times daily 12/31/18 1041 01/04/19    12/31/18 0000  azithromycin (ZITHROMAX) 500 MG tablet  Status:  Discontinued     500 mg Oral Daily 12/31/18 1041 01/04/19    12/26/18 2200  vancomycin (VANCOCIN) IVPB 1000 mg/200 mL premix  Status:  Discontinued     1,000 mg 200 mL/hr over 60 Minutes Intravenous Every 24 hours 12/26/18 0027 12/26/18 1301   12/26/18 1430  cefTRIAXone (ROCEPHIN) 2 g in sodium chloride 0.9 % 100 mL IVPB  Status:  Discontinued     2 g 200 mL/hr over 30 Minutes Intravenous Every 24 hours 12/26/18 1301 12/31/18 1525   12/26/18 0600  piperacillin-tazobactam (ZOSYN) IVPB 3.375 g  Status:  Discontinued     3.375 g 12.5 mL/hr over 240 Minutes Intravenous Every 8 hours 12/26/18 0027 12/26/18 1301   12/26/18 0030  azithromycin (ZITHROMAX) 500 mg in sodium chloride 0.9 % 250 mL IVPB  Status:  Discontinued     500 mg 250 mL/hr over 60 Minutes Intravenous Daily at bedtime 12/26/18 0013 12/31/18 1530   29-Mar-2019 2030  vancomycin (VANCOCIN) IVPB 1000 mg/200 mL premix     1,000 mg 200 mL/hr over 60 Minutes Intravenous  Once 29-Mar-2019 2029 29-Mar-2019 2208   29-Mar-2019 2030  piperacillin-tazobactam (ZOSYN) IVPB 3.375 g     3.375 g 100 mL/hr over 30 Minutes Intravenous  Once 29-Mar-2019 2029 29-Mar-2019 2135         Medications  Scheduled Meds:  apixaban  5 mg Oral BID   aspirin EC  81 mg Oral Daily   atorvastatin  20 mg Oral q1800   dextromethorphan-guaiFENesin  1 tablet Oral BID   famotidine  20 mg Oral Daily    feeding supplement (PRO-STAT SUGAR FREE 64)  30 mL Oral BID   fluticasone  2 spray Each Nare Daily   furosemide  40 mg Oral Daily   insulin aspart  0-9 Units Subcutaneous Q4H   insulin aspart  3 Units Subcutaneous TID WC   insulin glargine  5 Units Subcutaneous QHS   ipratropium-albuterol  3 mL Nebulization TID   predniSONE  10 mg Oral Q breakfast   Ensure Max Protein  11 oz Oral Daily   sertraline  50 mg Oral Daily   sodium chloride flush  10-40 mL Intracatheter Q12H   Continuous Infusions:  PRN Meds:.albuterol, hydrALAZINE, ipratropium-albuterol, ondansetron (ZOFRAN) IV, sodium chloride, sodium chloride flush      Subjective:   Colm Lyford was seen and examined today.  O2 sats currently 100% on 4 L however overnight required BiPAP.  Alert and awake.  Denies any specific complaints.  No fever chills chest pain or shortness of breath.  Eating breakfast.   Denies any dizziness, nausea vomiting, abdominal pain.  Objective:   Vitals:   01/07/19 2337 01/08/19 0009 01/08/19 0426 01/08/19 0700  BP:   111/72   Pulse: 85 81 86   Resp: (!) 30 (!) 24 (!) 31   Temp:   (!) 97.5 F (36.4 C)   TempSrc:   Axillary   SpO2: 100% 100% 99% 98%  Weight:   62.4 kg     Intake/Output Summary (Last 24 hours) at 01/08/2019 1415 Last data filed at 01/08/2019 1049 Gross per 24 hour  Intake 222 ml  Output 2300 ml  Net -2078 ml     Wt Readings from Last 3 Encounters:  01/08/19 62.4 kg    Physical Exam  General: Much more alert and awake today  Eyes:   HEENT:  A  Cardiovascular: S1 S2 clear, no murmurs, RRR. No pedal edema b/l  Respiratory: Decreased breath sound at the bases  Gastrointestinal: Soft, nontender, nondistended, NBS  Ext: no pedal edema bilaterally  Neuro: no new deficits  Musculoskeletal: No cyanosis, clubbing  Skin: No rashes  Psych: Alert, oriented    Data Reviewed:  I have personally reviewed following labs and imaging studies  Micro  Results Recent Results (from the past 240 hour(s))  Culture, blood (routine x 2)     Status: None   Collection Time: 12/30/18  9:30 AM  Result Value Ref Range Status   Specimen Description BLOOD RIGHT THUMB  Final   Special Requests   Final    BOTTLES DRAWN AEROBIC ONLY Blood Culture results may not be optimal due to an inadequate volume of blood received in culture bottles   Culture   Final    NO GROWTH 5 DAYS Performed at [facility], [address], [place], [place] [address]    Report Status 01/04/2019 FINAL  Final  Culture, blood (routine x 2)     Status: None   Collection Time: 12/30/18  4:32 PM  Result Value Ref Range Status   Specimen Description BLOOD SITE NOT SPECIFIED  Final   Special Requests AEROBIC BOTTLE ONLY Blood Culture adequate volume  Final   Culture   Final    NO GROWTH 5 DAYS Performed at [facility], [address] [place], [place] [address]    Report Status 01/04/2019 FINAL  Final  Respiratory Panel by PCR     Status: None   Collection Time: 01/02/19  9:24 AM  Result Value Ref Range Status   Adenovirus NOT DETECTED NOT DETECTED Final   Coronavirus 229E NOT DETECTED NOT DETECTED Final    Comment: (NOTE) The Coronavirus on the Respiratory Panel, DOES NOT test for the novel  Coronavirus (2019 nCoV)    Coronavirus HKU1 NOT DETECTED NOT DETECTED Final   Coronavirus NL63 NOT DETECTED NOT DETECTED Final   Coronavirus OC43 NOT DETECTED NOT DETECTED Final   Metapneumovirus NOT DETECTED NOT DETECTED Final   Rhinovirus / Enterovirus NOT DETECTED NOT DETECTED Final   Influenza A NOT DETECTED NOT DETECTED Final   Influenza B NOT DETECTED NOT DETECTED Final   Parainfluenza Virus 1 NOT DETECTED NOT DETECTED  Final   Parainfluenza Virus 2 NOT DETECTED NOT DETECTED Final   Parainfluenza Virus 3 NOT DETECTED NOT DETECTED Final   Parainfluenza Virus 4 NOT DETECTED NOT DETECTED Final   Respiratory Syncytial Virus NOT DETECTED NOT DETECTED Final    Bordetella pertussis NOT DETECTED NOT DETECTED Final   Chlamydophila pneumoniae NOT DETECTED NOT DETECTED Final   Mycoplasma pneumoniae NOT DETECTED NOT DETECTED Final    Comment: Performed at [facility], [address] [place], [place] [address]  SARS Coronavirus 2 (CEPHEID - Performed in [facility] hospital lab), Hosp Order     Status: None   Collection Time: 01/02/19  9:24 AM  Result Value Ref Range Status   SARS Coronavirus 2 NEGATIVE NEGATIVE Final    Comment: (NOTE) If result is NEGATIVE SARS-CoV-2 target nucleic acids are NOT DETECTED. The SARS-CoV-2 RNA is generally detectable in upper and lower  respiratory specimens during the acute phase of infection. The lowest  concentration of SARS-CoV-2 viral copies this assay can detect is 250  copies / mL. A negative result does not preclude SARS-CoV-2 infection  and should not be used as the sole basis for treatment or other  patient management decisions.  A negative result may occur with  improper specimen collection / handling, submission of specimen other  than nasopharyngeal swab, presence of viral mutation(s) within the  areas targeted by this assay, and inadequate number of viral copies  (<250 copies / mL). A negative result must be combined with clinical  observations, patient history, and epidemiological information. If result is POSITIVE SARS-CoV-2 target nucleic acids are DETECTED. The SARS-CoV-2 RNA is generally detectable in upper and lower  respiratory specimens dur ing the acute phase of infection.  Positive  results are indicative of active infection with SARS-CoV-2.  Clinical  correlation with patient history and other diagnostic information is  necessary to determine patient infection status.  Positive results do  not rule out bacterial infection or co-infection with other viruses. If result is PRESUMPTIVE POSTIVE SARS-CoV-2 nucleic acids MAY BE PRESENT.   A presumptive positive result was obtained on the  submitted specimen  and confirmed on repeat testing.  While 2019 novel coronavirus  (SARS-CoV-2) nucleic acids may be present in the submitted sample  additional confirmatory testing may be necessary for epidemiological  and / or clinical management purposes  to differentiate between  SARS-CoV-2 and other Sarbecovirus currently known to infect humans.  If clinically indicated additional testing with an alternate test  methodology [phone]) is advised. The SARS-CoV-2 RNA is generally  detectable in upper and lower respiratory sp ecimens during the acute  phase of infection. The expected result is Negative. Fact Sheet for Patients:  BoilerBrush.com.cy Fact Sheet for Healthcare Providers: https://pope.com/ This test is not yet approved or cleared by the [place] FDA and has been authorized for detection and/or diagnosis of SARS-CoV-2 by FDA under an Emergency Use Authorization (EUA).  This EUA will remain in effect (meaning this test can be used) for the duration of the COVID-19 declaration under Section 564(b)(1) of the Act, 21 U.S.C. section 360bbb-3(b)(1), unless the authorization is terminated or revoked sooner. Performed at [facility], [address] [place], [place] [address]     Radiology Reports Ct Head Wo Contrast  Result Date: 12/26/2018 CLINICAL DATA:  73 year old male with ataxia, encephalopathy, atrial fibrillation. EXAM: CT HEAD WITHOUT CONTRAST TECHNIQUE: Contiguous axial images were obtained from the base of the skull through the vertex without intravenous contrast. COMPARISON:  None. FINDINGS: Brain: Cerebral volume is within normal limits for age. No midline shift, ventriculomegaly, mass effect, evidence of mass lesion, intracranial hemorrhage or evidence of cortically based acute infarction. Patchy periatrial white matter hypodensity. No cortical encephalomalacia. Vascular: Calcified atherosclerosis at the  skull base. No suspicious intracranial vascular hyperdensity. Skull: Negative. Sinuses/Orbits: Visualized paranasal sinuses and mastoids are well pneumatized. Other: Negative orbits. Visualized scalp soft tissues are within normal limits. IMPRESSION: 1. No acute intracranial abnormality. 2. Mild for age nonspecific cerebral white matter changes, most commonly due to small vessel disease. Electronically Signed   By: Odessa Fleming M.D.   On: 12/26/2018 01:49   Dg Chest Port 1 View  Result Date: 01/05/2019 CLINICAL DATA:  73 year old with dyspnea. EXAM: PORTABLE CHEST 1 VIEW COMPARISON:  12/31/2018 and Jan 14, 2019 FINDINGS: Residual interstitial densities in the left upper lobe. Persistent densities in the right lower chest compatible with pleural fluid and atelectasis/consolidation. Increased densities at the left lung base compatible with increasing left pleural fluid. There is a left arm PICC line with the catheter tip near the SVC and right atrium junction. Heart size is upper limits of normal but stable. IMPRESSION: 1. Increased densities at the left lung base are compatible with small left pleural effusion with left basilar atelectasis or consolidation. 2. Persistent right basilar densities compatible with small right pleural effusion with atelectasis or consolidation. 3. Persistent interstitial densities in left upper lung. Concern for underlying pneumonia. Electronically Signed   By: Richarda Overlie M.D.   On: 01/05/2019 12:06   Dg Chest Port 1 View  Result Date: 12/31/2018 CLINICAL DATA:  Dyspnea at rest EXAM: PORTABLE CHEST 1 VIEW COMPARISON:  01/14/19 chest radiograph. FINDINGS: Stable cardiomediastinal silhouette with normal heart size. No pneumothorax. No left pleural effusion. Chronic blunting of the right costophrenic angle back to 11/26/2018. Hazy upper left lung and right lung base opacity is stable. New left retrocardiac consolidation. IMPRESSION: 1. New left retrocardiac consolidation. Stable hazy upper  left lung and right lung base opacity. Findings suggest multilobar pneumonia. 2. Chronic blunting of the right costophrenic angle back to 11/26/2026 radiograph, compatible with small pleural effusion and/or pleural-parenchymal scarring. Electronically Signed   By: Delbert Phenix M.D.   On: 12/31/2018 15:01   Dg Chest Port 1 View  Result Date: 2019/01/14 CLINICAL DATA:  73 year old male with sepsis. EXAM: PORTABLE CHEST 1 VIEW COMPARISON:  11/26/2018 and earlier. FINDINGS: Portable AP supine view at 2057 hours. Stable large lung volumes. Stable cardiac size and mediastinal contours. Visualized tracheal air column is within normal limits. No pneumothorax, pleural effusion or consolidation. New asymmetric reticulonodular opacity in the left upper lung. No acute osseous abnormality identified. IMPRESSION: New left upper lobe reticulonodular pulmonary opacity suspicious for acute viral/atypical respiratory infection. Underlying pulmonary hyperinflation. Electronically Signed   By: Odessa Fleming M.D.   On: 2019/01/14 22:02   [place] Ekg Site Rite  Result Date: 01/01/2019 If Site Rite image not attached, placement could not be confirmed due to current cardiac rhythm.   Lab Data:  CBC: Recent Labs  Lab 01/03/19 0638  WBC 7.4  NEUTROABS 6.8  HGB 8.6*  HCT 30.1*  MCV 102.4*  PLT 179   Basic Metabolic Panel: Recent Labs  Lab 01/03/19 0638 01/05/19 0657  NA 141 143  K 4.8 4.2  CL 90* 81*  CO2 46* >50*  GLUCOSE 135* 122*  BUN 16 16  CREATININE 0.56* 0.45*  CALCIUM 8.9 9.1   GFR: CrCl cannot be calculated (Unknown ideal weight.). Liver Function  Tests: No results for input(s): AST, ALT, ALKPHOS, BILITOT, PROT, ALBUMIN in the last 168 hours. No results for input(s): LIPASE, AMYLASE in the last 168 hours. No results for input(s): AMMONIA in the last 168 hours. Coagulation Profile: No results for input(s): INR, PROTIME in the last 168 hours. Cardiac Enzymes: No results for input(s): CKTOTAL, CKMB,  CKMBINDEX, TROPONINI in the last 168 hours. BNP (last 3 results) No results for input(s): PROBNP in the last 8760 hours. HbA1C: No results for input(s): HGBA1C in the last 72 hours. CBG: Recent Labs  Lab 01/08/19 0009 01/08/19 0424 01/08/19 0449 01/08/19 0745 01/08/19 1127  GLUCAP 98 69* 108* 143* 270*   Lipid Profile: No results for input(s): CHOL, HDL, LDLCALC, TRIG, CHOLHDL, LDLDIRECT in the last 72 hours. Thyroid Function Tests: No results for input(s): TSH, T4TOTAL, FREET4, T3FREE, THYROIDAB in the last 72 hours. Anemia Panel: No results for input(s): VITAMINB12, FOLATE, FERRITIN, TIBC, IRON, RETICCTPCT in the last 72 hours. Urine analysis:    Component Value Date/Time   COLORURINE AMBER (A) 01-05-2019 2036   APPEARANCEUR CLOUDY (A) 01/05/19 2036   LABSPEC 1.020 01/05/2019 2036   PHURINE 5.0 2019-01-05 2036   GLUCOSEU >=500 (A) 01/05/19 2036   HGBUR NEGATIVE 2019/01/05 2036   BILIRUBINUR NEGATIVE 01-05-19 2036   KETONESUR NEGATIVE Jan 05, 2019 2036   PROTEINUR 100 (A) 01/05/2019 2036   NITRITE NEGATIVE Jan 05, 2019 2036   LEUKOCYTESUR NEGATIVE 01/05/19 2036     Peyten Weare M.D. [facility] 01/08/2019, 2:15 PM  Pager: [phone] Between 7am to 7pm - call Pager - [phone]  After 7pm go to [email or web address] - password [email or web address]  Call night coverage person covering after 7pm

## 2019-01-08 NOTE — Progress Notes (Signed)
Physical Therapy Treatment Patient Details Name: Quinzell Follett MRN: [id number] DOB: [birth year] Today's Date: 01/08/2019    History of Present Illness 73 yo admitted from [facility] with AMS and SOB with HCAP covid (-). PMhx: COPD on home O2, HLD, DM, Afib, depression    PT Comments    Pt in bed on 2 lts at 92%.  AxO x 3 pleasant.  Pt wanting to get up.  Only tolertaed 1 min EOB due to pain.  General bed mobility comments: required increased assist and increased time due to buttock pain.  "It hurts"  pain increased with sitting EOB.  Only tolerated 1 min partial upright posture.  Quickly returned to sidelying R.  Positioned to comfort.     Follow Up Recommendations  SNF;Supervision/Assistance - 24 hour  Pt plans to return to SNF   Equipment Recommendations  None recommended by PT    Recommendations for Other Services       Precautions / Restrictions Precautions Precautions: Fall Precaution Comments: home O2 3L Restrictions Weight Bearing Restrictions: No    Mobility  Bed Mobility Overal bed mobility: Needs Assistance Bed Mobility: Supine to Sit;Sit to Supine     Supine to sit: Min guard Sit to supine: Min assist   General bed mobility comments: required increased assist and increased time due to buttock pain.  "It hurts"  pain increased with sitting EOB.  Only tolerated 1 min partial upright posture.  Quickly returned to sidelying R.  Positioned to comfort.    Transfers                 General transfer comment: pt declined due to pain.   Ambulation/Gait             General Gait Details: pt decl;ined due to pain   Stairs             Wheelchair Mobility    Modified Rankin (Stroke Patients Only)       Balance                                            Cognition Arousal/Alertness: Awake/alert Behavior During Therapy: Flat affect Overall Cognitive Status: History of cognitive impairments - at baseline Area of  Impairment: Orientation;Safety/judgement;Awareness                               General Comments: pt pleasant and following commands and able to express needs.        Exercises      General Comments        Pertinent Vitals/Pain Pain Assessment: Faces Faces Pain Scale: Hurts little more Pain Location: buttocks Pain Descriptors / Indicators: Discomfort;Grimacing Pain Intervention(s): Monitored during session;Repositioned    Home Living                      Prior Function            PT Goals (current goals can now be found in the care plan section) Progress towards PT goals: Progressing toward goals    Frequency    Min 2X/week      PT Plan Current plan remains appropriate    Co-evaluation              AM-PAC PT "6 Clicks" Mobility   Outcome Measure  Help  needed turning from your back to your side while in a flat bed without using bedrails?: A Little Help needed moving from lying on your back to sitting on the side of a flat bed without using bedrails?: A Little Help needed moving to and from a bed to a chair (including a wheelchair)?: A Little Help needed standing up from a chair using your arms (e.g., wheelchair or bedside chair)?: A Lot Help needed to walk in hospital room?: A Lot Help needed climbing 3-5 steps with a railing? : Total 6 Click Score: 14    End of Session Equipment Utilized During Treatment: Oxygen Activity Tolerance: Patient limited by pain Patient left: in bed;with call bell/phone within reach;with bed alarm set Nurse Communication: Mobility status PT Visit Diagnosis: Muscle weakness (generalized) (M62.81)     Time: 1050-1100 PT Time Calculation (min) (ACUTE ONLY): 10 min  Charges:  $Therapeutic Activity: 8-22 mins                     Felecia ShellingLori Sherika Kubicki  PTA Acute  Rehabilitation Services Pager      [phone] Office      [phone]

## 2019-01-09 DIAGNOSIS — J9602 Acute respiratory failure with hypercapnia: Secondary | ICD-10-CM

## 2019-01-09 LAB — GLUCOSE, CAPILLARY
Glucose-Capillary: 118 mg/dL — ABNORMAL HIGH (ref 70–99)
Glucose-Capillary: 258 mg/dL — ABNORMAL HIGH (ref 70–99)
Glucose-Capillary: 264 mg/dL — ABNORMAL HIGH (ref 70–99)
Glucose-Capillary: 331 mg/dL — ABNORMAL HIGH (ref 70–99)
Glucose-Capillary: 92 mg/dL (ref 70–99)
Glucose-Capillary: 94 mg/dL (ref 70–99)

## 2019-01-09 NOTE — Care Management Important Message (Signed)
Important Message  Patient Details  Name: Gregory Stone MRN: [id number] Date of Birth: 1946/03/18   Medicare Important Message Given:  Yes    [name] 01/09/2019, 10:34 AM

## 2019-01-09 NOTE — Progress Notes (Signed)
Patient had a bloody nose this morning and a clot when he blew his nose. Noted that O2 [place] was without humidity. Humidity added and used saline spray and flonase. Bleeding noted next few hours but then resolved. No further bleeding during shift.

## 2019-01-09 NOTE — Progress Notes (Signed)
PROGRESS NOTE    Gregory Stone  ZOX:[id number] DOB: [birth year] DOA: 12/07/2018 PCP: Patient, No Pcp Per    Brief Narrative:  73 y.o.BM PMHx hyperlipidemia, diabetes mellitus type II uncontrolled with complication, COPDon3 L O2 at home, atrial fibrillation on Eliquis,depression, presented with altered mental status and shortness of breath.  Per report, he had well shortness of breath in the facility, found to have O2 sats in 50s.  He was given breathing treatments and had also been treated with a dose of Ativan then patient became minimally responsive.  In ED he was barely arousable.  In ED patient was found to have negative COVID test, negative flu test WBC is 12.3, lactic acid 4.9, troponin  0.03, UA suggestive of UTI.  A. fib with RVR, O2 sats 100% on 5 L. ABG showed pH of 7.18, PCO2 103, PO2 142.  Chest x-ray showed left upper lobe infiltrates.  CT head negative.  Assessment & Plan:   Principal Problem:   HCAP (healthcare-associated pneumonia) Active Problems:   HLD (hyperlipidemia)   Diabetes mellitus without complication (HCC)   Depression   COPD (chronic obstructive pulmonary disease) (HCC)   Atrial fibrillation with RVR (HCC)   Acute on chronic respiratory failure with hypoxia (HCC)   Acute metabolic encephalopathy   Sepsis (HCC)   Hyperkalemia   Abnormal LFTs   Elevated troponin   COPD exacerbation (HCC)   Sepsis due to pneumonia (HCC)   Diabetes mellitus type 2, uncontrolled, with complications (HCC)   Anxiety   Acute on chronic respiratory failure with hypoxia and hypercapnia (HCC)   Palliative care encounter   Principal Problem:   HCAP (healthcare-associated pneumonia)/sepsis with COPD exacerbation, acute hypercarbic and hypoxic respiratory failure -Patient had been progressively improving, until he was scheduled for discharge on Sunday, 5/3, became hypoxic.  Repeat CXR showed multifocal PNA. COVID test repeated was negative on 5/5 -RVP panel showed positive  coronavirus HKU 1. Urine strep antigen, Legionella antigen negative -Patient was again scheduled for discharge on 01/05/2019 when he became hypoxic and was placed on BiPAP again.   Continue Lasix, oral prednisone.   2D echo showed EF of 55 to 60%, possible hypokinesis of left ventricular basal-mid inferior wall. -Had intermittently required BiPAP -CCM was consulted, recommended palliative GOC for likely end-stage COPD.  -This AM, pt noted to have tachypnea with RR of 30-40bpm. Pt's O2 requirements increased from 4LNC to [facility]. -Weaning O2 as tolerated. If able to wean O2 and not require further BiPAP, then may be candidate for d/c to SNF, however if pt further decompensates overnight, then may benefit from more palliative focus  Active Problems: Acute metabolic encephalopathy -Likely due to hypercarbic and hypoxic respiratory failure,  -appears currently, improved   Atrial fibrillation with RVR -Most likely due to sepsis, #1 -Currently normal sinus rhythm -Continue Eliquis, CHADVASC 2 -Stable at present  Elevated troponin -Likely due to demand ischemia due to #1 -Currently no chest pain.  EKG showed sinus tachycardia, no acute ischemic changes   Diabetes mellitus type 2 uncontrolled with hyperglycemia -Hemoglobin A1c 7.6 -Continue oral prednisone 10 mg daily  -Early this morning hypoglycemia with CBG 69, now in 200s. -Have stopped basal insulin, continue NovoLog meal coverage and sliding scale insulin  Lactic acidosis Resolved  Hyperlipidemia Continue Lipitor as tolerated  History of depression and anxiety - Holding Ativan - continue Zoloft  Abnormal LFTs Hepatitis panel negative  DVT prophylaxis: eliquis Code Status: DNR Family Communication: Pt in room, family not at bedside Disposition Plan: Uncertain  at this time  Consultants:   Palliative Care  Procedures:     Antimicrobials: Anti-infectives (From admission, onward)   Start     Dose/Rate Route  Frequency Ordered Stop   01/05/19 0000  amoxicillin-clavulanate (AUGMENTIN) 875-125 MG tablet     1 tablet Oral 2 times daily 01/05/19 0930 01/08/19 2359   12/31/18 1600  piperacillin-tazobactam (ZOSYN) IVPB 3.375 g     3.375 g 12.5 mL/hr over 240 Minutes Intravenous Every 8 hours 12/31/18 1525 01/07/19 1359   12/31/18 0000  cefdinir (OMNICEF) 300 MG capsule  Status:  Discontinued     300 mg Oral 2 times daily 12/31/18 1041 01/04/19    12/31/18 0000  azithromycin (ZITHROMAX) 500 MG tablet  Status:  Discontinued     500 mg Oral Daily 12/31/18 1041 01/04/19    12/26/18 2200  vancomycin (VANCOCIN) IVPB 1000 mg/200 mL premix  Status:  Discontinued     1,000 mg 200 mL/hr over 60 Minutes Intravenous Every 24 hours 12/26/18 0027 12/26/18 1301   12/26/18 1430  cefTRIAXone (ROCEPHIN) 2 g in sodium chloride 0.9 % 100 mL IVPB  Status:  Discontinued     2 g 200 mL/hr over 30 Minutes Intravenous Every 24 hours 12/26/18 1301 12/31/18 1525   12/26/18 0600  piperacillin-tazobactam (ZOSYN) IVPB 3.375 g  Status:  Discontinued     3.375 g 12.5 mL/hr over 240 Minutes Intravenous Every 8 hours 12/26/18 0027 12/26/18 1301   12/26/18 0030  azithromycin (ZITHROMAX) 500 mg in sodium chloride 0.9 % 250 mL IVPB  Status:  Discontinued     500 mg 250 mL/hr over 60 Minutes Intravenous Daily at bedtime 12/26/18 0013 12/31/18 1530   01/19/2019 2030  vancomycin (VANCOCIN) IVPB 1000 mg/200 mL premix     1,000 mg 200 mL/hr over 60 Minutes Intravenous  Once 2019-01-19 2029 Jan 19, 2019 2208   01/19/2019 2030  piperacillin-tazobactam (ZOSYN) IVPB 3.375 g     3.375 g 100 mL/hr over 30 Minutes Intravenous  Once 01-19-2019 2029 19-Jan-2019 2135       Subjective: Without complaints  Objective: Vitals:   01/08/19 2022 01/09/19 0456 01/09/19 0732 01/09/19 1400  BP: (!) 145/69 (!) 143/78    Pulse: 97 (!) 101    Resp: 20 (!) 28    Temp: 98.3 F (36.8 C) 97.9 F (36.6 C)    TempSrc: Oral Oral    SpO2:  90% 96% 100%  Weight:  60.8  kg      Intake/Output Summary (Last 24 hours) at 01/09/2019 1650 Last data filed at 01/08/2019 2000 Gross per 24 hour  Intake 60 ml  Output 300 ml  Net -240 ml   Filed Weights   01/07/19 0411 01/08/19 0426 01/09/19 0456  Weight: 62.7 kg 62.4 kg 60.8 kg    Examination:  General exam: Appears calm and comfortable  Respiratory system: Clear to auscultation. Respiratory effort normal. Cardiovascular system: S1 & S2 heard, RRR Gastrointestinal system: Abdomen is nondistended, soft and nontender. No organomegaly or masses felt. Normal bowel sounds heard. Central nervous system: Alert and oriented. No focal neurological deficits. Extremities: Symmetric 5 x 5 power. Skin: No rashes, lesions Psychiatry: Judgement and insight appear normal. Mood & affect appropriate.   Data Reviewed: I have personally reviewed following labs and imaging studies  CBC: Recent Labs  Lab 01/03/19 0638  WBC 7.4  NEUTROABS 6.8  HGB 8.6*  HCT 30.1*  MCV 102.4*  PLT 179   Basic Metabolic Panel: Recent Labs  Lab 01/03/19 1610 01/05/19  0657  NA 141 143  K 4.8 4.2  CL 90* 81*  CO2 46* >50*  GLUCOSE 135* 122*  BUN 16 16  CREATININE 0.56* 0.45*  CALCIUM 8.9 9.1   GFR: CrCl cannot be calculated (Unknown ideal weight.). Liver Function Tests: No results for input(s): AST, ALT, ALKPHOS, BILITOT, PROT, ALBUMIN in the last 168 hours. No results for input(s): LIPASE, AMYLASE in the last 168 hours. No results for input(s): AMMONIA in the last 168 hours. Coagulation Profile: No results for input(s): INR, PROTIME in the last 168 hours. Cardiac Enzymes: No results for input(s): CKTOTAL, CKMB, CKMBINDEX, TROPONINI in the last 168 hours. BNP (last 3 results) No results for input(s): PROBNP in the last 8760 hours. HbA1C: No results for input(s): HGBA1C in the last 72 hours. CBG: Recent Labs  Lab 01/09/19 0026 01/09/19 0456 01/09/19 0736 01/09/19 1121 01/09/19 1632  GLUCAP 94 92 264* 331* 258*    Lipid Profile: No results for input(s): CHOL, HDL, LDLCALC, TRIG, CHOLHDL, LDLDIRECT in the last 72 hours. Thyroid Function Tests: No results for input(s): TSH, T4TOTAL, FREET4, T3FREE, THYROIDAB in the last 72 hours. Anemia Panel: No results for input(s): VITAMINB12, FOLATE, FERRITIN, TIBC, IRON, RETICCTPCT in the last 72 hours. Sepsis Labs: No results for input(s): PROCALCITON, LATICACIDVEN in the last 168 hours.  Recent Results (from the past 240 hour(s))  Respiratory Panel by PCR     Status: None   Collection Time: 01/02/19  9:24 AM  Result Value Ref Range Status   Adenovirus NOT DETECTED NOT DETECTED Final   Coronavirus 229E NOT DETECTED NOT DETECTED Final    Comment: (NOTE) The Coronavirus on the Respiratory Panel, DOES NOT test for the novel  Coronavirus (2019 nCoV)    Coronavirus HKU1 NOT DETECTED NOT DETECTED Final   Coronavirus NL63 NOT DETECTED NOT DETECTED Final   Coronavirus OC43 NOT DETECTED NOT DETECTED Final   Metapneumovirus NOT DETECTED NOT DETECTED Final   Rhinovirus / Enterovirus NOT DETECTED NOT DETECTED Final   Influenza A NOT DETECTED NOT DETECTED Final   Influenza B NOT DETECTED NOT DETECTED Final   Parainfluenza Virus 1 NOT DETECTED NOT DETECTED Final   Parainfluenza Virus 2 NOT DETECTED NOT DETECTED Final   Parainfluenza Virus 3 NOT DETECTED NOT DETECTED Final   Parainfluenza Virus 4 NOT DETECTED NOT DETECTED Final   Respiratory Syncytial Virus NOT DETECTED NOT DETECTED Final   Bordetella pertussis NOT DETECTED NOT DETECTED Final   Chlamydophila pneumoniae NOT DETECTED NOT DETECTED Final   Mycoplasma pneumoniae NOT DETECTED NOT DETECTED Final    Comment: Performed at [facility], [address] [place], [place] [address]  SARS Coronavirus 2 (CEPHEID - Performed in [facility] hospital lab), Hosp Order     Status: None   Collection Time: 01/02/19  9:24 AM  Result Value Ref Range Status   SARS Coronavirus 2 NEGATIVE NEGATIVE Final    Comment:  (NOTE) If result is NEGATIVE SARS-CoV-2 target nucleic acids are NOT DETECTED. The SARS-CoV-2 RNA is generally detectable in upper and lower  respiratory specimens during the acute phase of infection. The lowest  concentration of SARS-CoV-2 viral copies this assay can detect is 250  copies / mL. A negative result does not preclude SARS-CoV-2 infection  and should not be used as the sole basis for treatment or other  patient management decisions.  A negative result may occur with  improper specimen collection / handling, submission of specimen other  than nasopharyngeal swab, presence of viral mutation(s)  within the  areas targeted by this assay, and inadequate number of viral copies  (<250 copies / mL). A negative result must be combined with clinical  observations, patient history, and epidemiological information. If result is POSITIVE SARS-CoV-2 target nucleic acids are DETECTED. The SARS-CoV-2 RNA is generally detectable in upper and lower  respiratory specimens dur ing the acute phase of infection.  Positive  results are indicative of active infection with SARS-CoV-2.  Clinical  correlation with patient history and other diagnostic information is  necessary to determine patient infection status.  Positive results do  not rule out bacterial infection or co-infection with other viruses. If result is PRESUMPTIVE POSTIVE SARS-CoV-2 nucleic acids MAY BE PRESENT.   A presumptive positive result was obtained on the submitted specimen  and confirmed on repeat testing.  While 2019 novel coronavirus  (SARS-CoV-2) nucleic acids may be present in the submitted sample  additional confirmatory testing may be necessary for epidemiological  and / or clinical management purposes  to differentiate between  SARS-CoV-2 and other Sarbecovirus currently known to infect humans.  If clinically indicated additional testing with an alternate test  methodology [id number]) is advised. The SARS-CoV-2 RNA is  generally  detectable in upper and lower respiratory sp ecimens during the acute  phase of infection. The expected result is Negative. Fact Sheet for Patients:  BoilerBrush.com.cyhttps://www.fda.gov/media/136312/download Fact Sheet for Healthcare Providers: https://pope.com/https://www.fda.gov/media/136313/download This test is not yet approved or cleared by the [place] FDA and has been authorized for detection and/or diagnosis of SARS-CoV-2 by FDA under an Emergency Use Authorization (EUA).  This EUA will remain in effect (meaning this test can be used) for the duration of the COVID-19 declaration under Section 564(b)(1) of the Act, 21 U.S.C. section 360bbb-3(b)(1), unless the authorization is terminated or revoked sooner. Performed at [facility], [address] [place], [place] [address]      Radiology Studies: No results found.  Scheduled Meds: . apixaban  5 mg Oral BID  . aspirin EC  81 mg Oral Daily  . atorvastatin  20 mg Oral q1800  . dextromethorphan-guaiFENesin  1 tablet Oral BID  . famotidine  20 mg Oral Daily  . feeding supplement (PRO-STAT SUGAR FREE 64)  30 mL Oral BID  . fluticasone  2 spray Each Nare Daily  . furosemide  40 mg Oral Daily  . insulin aspart  0-9 Units Subcutaneous Q4H  . insulin aspart  3 Units Subcutaneous TID WC  . ipratropium-albuterol  3 mL Nebulization TID  . predniSONE  10 mg Oral Q breakfast  . Ensure Max Protein  11 oz Oral Daily  . sertraline  50 mg Oral Daily  . sodium chloride flush  10-40 mL Intracatheter Q12H   Continuous Infusions:   LOS: 14 days   Rickey BarbaraStephen Liddie Chichester, MD [facility] Pager On Amion  If 7PM-7AM, please contact night-coverage 01/09/2019, 4:50 PM

## 2019-01-09 NOTE — Progress Notes (Addendum)
Daily Progress Note   Patient Name: Gregory Stone       Date: 01/09/2019 DOB: 06-14-1946  Age: 73 y.o. MRN#: [id number] Attending Physician: Jerald Kief, MD Primary Care Physician: Patient, No Pcp Per Admit Date: Jan 09, 2019  Reason for Consultation/Follow-up: Establishing goals of care  Subjective: Patient able to speak in short phrases today.  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  I expressed my concern that we are having a difficult time making him better and that his COPD has likely progressed.  I shared that his options include possibly staying in an LTAC vs Hospice services.   I told him that his time now is valuable and if there are things he needs to share with his children he should do so.  Gregory Stone was not familiar with hospice services and expressed that he had not been treated well at some facilities.  "If my time is short or if its not - I'm ok."  After speaking with Gregory Stone privately, Dr. Chesley Mires and I had a 30 minute video call with the patient and his two children Gregory Stone and Gregory Stone).  We talked about his current condition, disposition options, and last wishes.  The family openly discussed funeral plans.  They would like to work on getting HCPOA paperwork and Durable POA paperwork completed and notarized.  The patient would like for his daughter Gregory Stone to be his HCPOA.     The family is hopeful for more time.  They are unable to care for Gregory Stone at home.  If he stabilizes or improves they would like SNF placement.  If he declines now or in the future - we discussed hospice house together with the patient.  Assessment: End stage COPD.  Recurrent hypercapnia necessitating BiPAP.     Patient Profile/HPI:    72 y.o. male  with past medical history of COPD on 3L at home, Afib on eliquis, DM, and depression who was admitted on 01/09/2019 with AMS and SOB.  Reportedly oxygen sats were 50% at his facility prior to admission.   Gregory Stone was admitted and treated for COPD exacerbation.   Unfortunately he has improved almost to discharge and then relapsed into hypercarbic respiratory failure twice.  Pulmonary has consulted  and recommended Palliative care measures.   Length of Stay: 14  Current Medications: Scheduled Meds:  . apixaban  5 mg Oral BID  . aspirin EC  81 mg Oral Daily  . atorvastatin  20 mg Oral q1800  . dextromethorphan-guaiFENesin  1 tablet Oral BID  . famotidine  20 mg Oral Daily  . feeding supplement (PRO-STAT SUGAR FREE 64)  30 mL Oral BID  . fluticasone  2 spray Each Nare Daily  . furosemide  40 mg Oral Daily  . insulin aspart  0-9 Units Subcutaneous Q4H  . insulin aspart  3 Units Subcutaneous TID WC  . ipratropium-albuterol  3 mL Nebulization TID  . predniSONE  10 mg Oral Q breakfast  . Ensure Max Protein  11 oz Oral Daily  . sertraline  50 mg Oral Daily  . sodium chloride flush  10-40 mL Intracatheter Q12H    Continuous Infusions:   PRN Meds: albuterol, hydrALAZINE, ipratropium-albuterol, morphine injection, ondansetron (ZOFRAN) IV, sodium chloride, sodium chloride flush  Physical Exam        Well developed thin male, pleasant, alert, orientated, speaks in short phrases CV rrr Resp SOB with decreased breath sounds Abdomen soft, nt, nd  Vital Signs: BP (!) 143/78 (BP Location: Right Arm)   Pulse (!) 101   Temp 97.9 F (36.6 C) (Oral)   Resp (!) 28   Wt 60.8 kg   SpO2 96%  SpO2: SpO2: 96 % O2 Device: O2 Device: Nasal Cannula O2 Flow Rate: O2 Flow Rate (L/min): 4 L/min  Intake/output summary:   Intake/Output Summary (Last 24 hours) at 01/09/2019 1157 Last data filed at 01/08/2019 2000 Gross per 24 hour  Intake 120 ml  Output 1000 ml  Net -880 ml   LBM:  Last BM Date: 01/07/19 Baseline Weight: Weight: 61.7 kg Most recent weight: Weight: 60.8 kg       Palliative Assessment/Data:  40%    Flowsheet Rows     Most Recent Value  Intake Tab  Referral Department  Hospitalist  Unit at Time of Referral  Med/Surg Unit  Date Notified  01/05/19  Palliative Care Type  New Palliative care  Reason for referral  Clarify Goals of Care  Date of Admission  12/04/2018  Date first seen by Palliative Care  01/06/19  # of days Palliative referral response time  1 Day(s)  # of days IP prior to Palliative referral  11  Clinical Assessment  Psychosocial & Spiritual Assessment  Palliative Care Outcomes      Patient Active Problem List   Diagnosis Date Noted  . Acute on chronic respiratory failure with hypoxia and hypercapnia (HCC)   . Palliative care encounter   . Sepsis due to pneumonia (HCC) 12/27/2018  . Diabetes mellitus type 2, uncontrolled, with complications (HCC) 12/27/2018  . Anxiety 12/27/2018  . COPD exacerbation (HCC) 12/26/2018  . Pressure injury of skin 12/26/2018  . COPD (chronic obstructive pulmonary disease) (HCC) 12/26/2018  . Atrial fibrillation with RVR (HCC) 12/09/2018  . Acute on chronic respiratory failure with hypoxia (HCC) 12/19/2018  . Acute metabolic encephalopathy 12/21/2018  . Sepsis (HCC) 12/11/2018  . Hyperkalemia 12/10/2018  . Abnormal LFTs 12/27/2018  . Elevated troponin 12/12/2018  . HCAP (healthcare-associated pneumonia) 12/17/2018  . HLD (hyperlipidemia)   . Diabetes mellitus without complication (HCC)   . Depression     Palliative Care Plan    Recommendations/Plan:  Full scope treatment.  Continue to work towards improvement, PT / OT, SNF placement  Patient and  family are aware of how fragile things are at this Stone, and if he declines they are aware of Hospice House.  SNF that will do BiPAP QHS and PRN, Palliative to follow at SNF please .  Goals of Care and Additional Recommendations:   Limitations on Scope of Treatment: Full Scope Treatment  Code Status:  DNR  Prognosis:   < 6 months secondary to end stage COPD and recurrent hypercapnia with severe deconditioning, frequent hospitalizations and decline.   Discharge Planning:  Skilled Nursing Facility for rehab with Palliative care service follow-up  Care plan was discussed with patient, case management, [facility] Attending MD, Family.  Thank you for allowing the Palliative Medicine Team to assist in the care of this patient.  Total time spent:  70 min. Time in:  10:00  Time Out:  10:40 Time In:  4:30 Time out: 5:00      Greater than 50%  of this time was spent counseling and coordinating care related to the above assessment and plan.  Norvel Richards, PA-C Palliative Medicine  Please contact Palliative MedicineTeam phone at [phone] for questions and concerns between 7 am - 7 pm.   Please see AMION for individual provider pager numbers.

## 2019-01-09 NOTE — Progress Notes (Signed)
OT Cancellation Note  Patient Details Name: Gregory Stone MRN: [id number] DOB: Mar 19, 1946   Cancelled Treatment:    Reason Eval/Treat Not Completed: Patient not medically ready(SOB and RN requesting to hold)  Fontaine No, OTR/L  Acute Rehabilitation Services Pager: [phone] Office: [phone] .  01/09/2019, 11:08 AM

## 2019-01-09 NOTE — Progress Notes (Signed)
Inpatient Diabetes Program Recommendations  AACE/ADA: New Consensus Statement on Inpatient Glycemic Control (2015)  Target Ranges:  Prepandial:   less than 140 mg/dL      Peak postprandial:   less than 180 mg/dL (1-2 hours)      Critically ill patients:  140 - 180 mg/dL   Lab Results  Component Value Date   GLUCAP 331 (H) 01/09/2019   HGBA1C 7.6 (H) 12/26/2018    Review of Glycemic Control Results for Gregory Stone, Gregory Stone (MRN [id number]) as of 01/09/2019 13:23  Ref. Range 01/08/2019 20:20 01/09/2019 00:26 01/09/2019 04:56 01/09/2019 07:36 01/09/2019 11:21  Glucose-Capillary Latest Ref Range: 70 - 99 mg/dL 563 (H) 94 92 875 (H) 643 (H)   Inpatient Diabetes Program Recommendations:   Noted hypoglycemia and then hyperglycemia. Since patient is eating: -Change Novolog correction to TID -Add Lantus 5 units daily  Thank you, Billy Fischer. Sheron Robin, RN, MSN, CDE  Diabetes Coordinator Inpatient Glycemic Control Team Team Pager [phone] (8am-5pm) 01/09/2019 1:24 PM

## 2019-01-10 DIAGNOSIS — J9602 Acute respiratory failure with hypercapnia: Secondary | ICD-10-CM

## 2019-01-10 DIAGNOSIS — J449 Chronic obstructive pulmonary disease, unspecified: Secondary | ICD-10-CM

## 2019-01-10 LAB — CBC
HCT: 27.8 % — ABNORMAL LOW (ref 39.0–52.0)
Hemoglobin: 7.7 g/dL — ABNORMAL LOW (ref 13.0–17.0)
MCH: 28.7 pg (ref 26.0–34.0)
MCHC: 27.7 g/dL — ABNORMAL LOW (ref 30.0–36.0)
MCV: 103.7 fL — ABNORMAL HIGH (ref 80.0–100.0)
Platelets: 104 10*3/uL — ABNORMAL LOW (ref 150–400)
RBC: 2.68 MIL/uL — ABNORMAL LOW (ref 4.22–5.81)
RDW: 15.4 % (ref 11.5–15.5)
WBC: 5.8 10*3/uL (ref 4.0–10.5)
nRBC: 0 % (ref 0.0–0.2)

## 2019-01-10 LAB — GLUCOSE, CAPILLARY
Glucose-Capillary: 114 mg/dL — ABNORMAL HIGH (ref 70–99)
Glucose-Capillary: 116 mg/dL — ABNORMAL HIGH (ref 70–99)
Glucose-Capillary: 125 mg/dL — ABNORMAL HIGH (ref 70–99)
Glucose-Capillary: 197 mg/dL — ABNORMAL HIGH (ref 70–99)
Glucose-Capillary: 266 mg/dL — ABNORMAL HIGH (ref 70–99)
Glucose-Capillary: 274 mg/dL — ABNORMAL HIGH (ref 70–99)
Glucose-Capillary: 30 mg/dL — CL (ref 70–99)
Glucose-Capillary: 98 mg/dL (ref 70–99)

## 2019-01-10 LAB — BASIC METABOLIC PANEL
BUN: 22 mg/dL (ref 8–23)
CO2: >50 mmol/L — ABNORMAL HIGH (ref 22–32)
Calcium: 9.3 mg/dL (ref 8.9–10.3)
Chloride: 85 mmol/L — ABNORMAL LOW (ref 98–111)
Creatinine, Ser: 0.57 mg/dL — ABNORMAL LOW (ref 0.61–1.24)
GFR calc Af Amer: >60 mL/min (ref >60)
GFR calc non Af Amer: >60 mL/min (ref >60)
Glucose, Bld: 108 mg/dL — ABNORMAL HIGH (ref 70–99)
Potassium: 4.4 mmol/L (ref 3.5–5.1)
Sodium: 143 mmol/L (ref 135–145)

## 2019-01-10 MED ORDER — MORPHINE SULFATE (CONCENTRATE) 10 MG/0.5ML PO SOLN
5.0000 mg | ORAL | Status: DC | PRN
  Administered 2019-01-10: 5 mg via ORAL
  Filled 2019-01-10: qty 0.5

## 2019-01-10 NOTE — Progress Notes (Signed)
Notified by Seward Grater NT that CBG was 30- check on patient and had him drink orange juice followed by a Glucernia- recheck of CBG at 0030 was 98. Will continue to monitor pt and CBG

## 2019-01-10 NOTE — Progress Notes (Signed)
Inpatient Diabetes Program Recommendations  AACE/ADA: New Consensus Statement on Inpatient Glycemic Control   Target Ranges:  Prepandial:   less than 140 mg/dL      Peak postprandial:   less than 180 mg/dL (1-2 hours)      Critically ill patients:  140 - 180 mg/dL   Results for TERION, GROSJEAN (MRN [id number]) as of 01/10/2019 13:02  Ref. Range 01/10/2019 00:01 01/10/2019 00:31 01/10/2019 01:58 01/10/2019 03:58 01/10/2019 08:38 01/10/2019 11:30  Glucose-Capillary Latest Ref Range: 70 - 99 mg/dL 30 (LL) 98 726 (H) 203 (H) 116 (H)  Novolog 3 units 266 (H)  Novolog 8 units  Results for RUTGER, FEEST (MRN [id number]) as of 01/10/2019 13:02  Ref. Range 01/09/2019 07:36 01/09/2019 11:21 01/09/2019 16:32 01/09/2019 20:21  Glucose-Capillary Latest Ref Range: 70 - 99 mg/dL 453 (H)  Novolog 8 units 331 (H)  Novolog 10 units 258 (H)  Novolog 8 units 118 (H)   Review of Glycemic Control  Diabetes history: DM2 Outpatient Diabetes medications: Basaglar 30 units @9am , Basaglar 10 units @6pm  Current orders for Inpatient glycemic control: Novolog 0-9 units Q4H, Novolog 3 units TID with meals  Inpatient Diabetes Program Recommendations:   Insulin - Meal Coverage: Please consider increasing meal coverage to Novolog 6 units TID with meals.  Thanks, Orlando Penner, RN, MSN, CDE Diabetes Coordinator Inpatient Diabetes Program [phone] (Team Pager from 8am to 5pm)

## 2019-01-10 NOTE — Progress Notes (Addendum)
Daily Progress Note   Patient Name: Gregory Stone       Date: 01/10/2019 DOB: [birth year]  Age: 73 y.o. MRN#: [id number] Attending Physician: Darlin Drop, DO Primary Care Physician: Patient, No Pcp Per Admit Date: 12/26/2018  Reason for Consultation/Follow-up: Establishing goals of care  Subjective: Patient barely able to answer questions.  He is having to lift his head with each breath in order to breath.  He drifts quickly off to sleep.  He tells me he did not wear BiPAP last night (and I do not see it documented that he wore it).  Patient reports he ate lunch.  I am unable to speak with him about designating an HCPOA.   I spoke with bedside RN to request morphine SL and BiPAP.   Assessment: Patient with end stage COPD and recurrent hypercapnia.    Likely needs BiPAP QHS and PRN.   Patient Profile/HPI:  72 y.o. male  with past medical history of COPD on 3L at home, Afib on eliquis, DM, and depression who was admitted on 12/15/2018 with AMS and SOB.  Reportedly oxygen sats were 50% at his facility prior to admission.   Gregory Stone was admitted and treated for COPD exacerbation. Unfortunately he has improved almost to discharge and then relapsed into hypercarbic respiratory failure twice. Pulmonary has consulted and recommended Palliative care be consulted.   Length of Stay: 15  Current Medications: Scheduled Meds:  . apixaban  5 mg Oral BID  . aspirin EC  81 mg Oral Daily  . atorvastatin  20 mg Oral q1800  . dextromethorphan-guaiFENesin  1 tablet Oral BID  . famotidine  20 mg Oral Daily  . feeding supplement (PRO-STAT SUGAR FREE 64)  30 mL Oral BID  . fluticasone  2 spray Each Nare Daily  . furosemide  40 mg Oral Daily  . insulin aspart  0-9 Units Subcutaneous Q4H  . insulin  aspart  3 Units Subcutaneous TID WC  . ipratropium-albuterol  3 mL Nebulization TID  . predniSONE  10 mg Oral Q breakfast  . Ensure Max Protein  11 oz Oral Daily  . sertraline  50 mg Oral Daily  . sodium chloride flush  10-40 mL Intracatheter Q12H    Continuous Infusions:   PRN Meds: albuterol, hydrALAZINE, ipratropium-albuterol, morphine injection, morphine CONCENTRATE, ondansetron (ZOFRAN) IV,  sodium chloride, sodium chloride flush  Physical Exam        Thin male, barely speaking, notable work of breathing. Lethargic. CV rrr resp N/C decreased breath sounds Abd soft, nt, nd  Vital Signs: BP (!) 119/93 (BP Location: Right Arm)   Pulse 95   Temp 98.4 F (36.9 C) (Oral)   Resp (!) 28   Wt 58.8 kg   SpO2 100%  SpO2: SpO2: 100 % O2 Device: O2 Device: Nasal Cannula O2 Flow Rate: O2 Flow Rate (L/min): 6 L/min  Intake/output summary:   Intake/Output Summary (Last 24 hours) at 01/10/2019 1313 Last data filed at 01/10/2019 1129 Gross per 24 hour  Intake 465 ml  Output 400 ml  Net 65 ml   LBM: Last BM Date: 01/07/19 Baseline Weight: Weight: 61.7 kg Most recent weight: Weight: 58.8 kg       Palliative Assessment/Data:  30%    Flowsheet Rows     Most Recent Value  Intake Tab  Referral Department  Hospitalist  Unit at Time of Referral  Med/Surg Unit  Date Notified  01/05/19  Palliative Care Type  New Palliative care  Reason for referral  Clarify Goals of Care  Date of Admission  04-26-2019  Date first seen by Palliative Care  01/06/19  # of days Palliative referral response time  1 Day(s)  # of days IP prior to Palliative referral  11  Clinical Assessment  Psychosocial & Spiritual Assessment  Palliative Care Outcomes      Patient Active Problem List   Diagnosis Date Noted  . End stage COPD (HCC)   . Acute respiratory failure with hypercapnia (HCC)   . Acute on chronic respiratory failure with hypoxia and hypercapnia (HCC)   . Palliative care encounter   .  Sepsis due to pneumonia (HCC) 12/27/2018  . Diabetes mellitus type 2, uncontrolled, with complications (HCC) 12/27/2018  . Anxiety 12/27/2018  . COPD exacerbation (HCC) 12/26/2018  . Pressure injury of skin 12/26/2018  . COPD (chronic obstructive pulmonary disease) (HCC) 08-27-202020  . Atrial fibrillation with RVR (HCC) 08-27-202020  . Acute on chronic respiratory failure with hypoxia (HCC) 08-27-202020  . Acute metabolic encephalopathy 08-27-202020  . Sepsis (HCC) 08-27-202020  . Hyperkalemia 08-27-202020  . Abnormal LFTs 08-27-202020  . Elevated troponin 08-27-202020  . HCAP (healthcare-associated pneumonia) 08-27-202020  . HLD (hyperlipidemia)   . Diabetes mellitus without complication (HCC)   . Depression     Palliative Care Plan    Recommendations/Plan:  Not eligible for LTAC.  He was at [facility] until 4/1  For now BiPAP QHS and PRN.  Morphine SL PRN  [facility] eligible if not improved using BiPAP QHS and PRN.  Family and patient were hopeful for SNF.   I feel certain a SNF is not going to be able to monitor successfully for PRN BiPAP - and he will return to the hospital in days to weeks.  My colleague Willette AlmaNikki Pickenpack-Cousar, NP will follow as needed.  Goals of Care and Additional Recommendations:  Limitations on Scope of Treatment: Full Scope Treatment  Code Status:  DNR  Prognosis:  With out BiPAP he would die in days.  With consistent appropriate BiPAP use he could have months.  Discharge Planning:  To Be Determined  Care plan was discussed with Daughter.  Thank you for allowing the Palliative Medicine Team to assist in the care of this patient.  Total time spent:  60 min.     Greater than 50%  of this  time was spent counseling and coordinating care related to the above assessment and plan.  Norvel Richards, PA-C Palliative Medicine  Please contact Palliative MedicineTeam phone at [phone] for questions and concerns between 7 am - 7 pm.   Please see  AMION for individual provider pager numbers.

## 2019-01-10 NOTE — Progress Notes (Signed)
PROGRESS NOTE  Gregory Stone ZOX:[id number] DOB: Nov 24, 1945 DOA: 12/12/2018 PCP: Patient, No Pcp Per  HPI/Recap of past 24 hours: 73 y.o.BM PMHx hyperlipidemia, diabetes mellitus type II uncontrolled with complication, COPDon3 L O2 at home, atrial fibrillation on Eliquis,depression, presented with altered mental status and shortness of breath. Per report, he had well shortness of breath in the facility, found to have O2 sats in 50s. He was given breathing treatments and had also been treated with a dose of Ativan then patient became minimally responsive. In ED he was barely arousable. In ED patient was found to have negative COVID test, negative flu test WBC is 12.3, lactic acid 4.9, troponin 0.03, UA suggestive of UTI. A. fib with RVR, O2 sats 100% on 5 L. ABG showed pH of 7.18, PCO2 103, PO2 142. Chest x-ray showed left upper lobe infiltrates. CT head negative.  01/10/19: Patient seen and examined at his bedside.  He is alert but confused.  He does not appear in distress.  Still requiring significant amount of oxygen supplementation to maintain O2 saturation greater than 90%.  Assessment/Plan: Principal Problem:   HCAP (healthcare-associated pneumonia) Active Problems:   HLD (hyperlipidemia)   Diabetes mellitus without complication (HCC)   Depression   COPD (chronic obstructive pulmonary disease) (HCC)   Atrial fibrillation with RVR (HCC)   Acute on chronic respiratory failure with hypoxia (HCC)   Acute metabolic encephalopathy   Sepsis (HCC)   Hyperkalemia   Abnormal LFTs   Elevated troponin   COPD exacerbation (HCC)   Sepsis due to pneumonia (HCC)   Diabetes mellitus type 2, uncontrolled, with complications (HCC)   Anxiety   Acute on chronic respiratory failure with hypoxia and hypercapnia (HCC)   Palliative care encounter   End stage COPD (HCC)   Acute respiratory failure with hypercapnia (HCC)  Acute metabolic encephalopathy, persistent Reorient as needed  Fall precautions CT head unremarkable for any acute intracranial findings  Acute on chronic hypoxic hypercarbic respiratory failure secondary to HCAP On 3 L of oxygen at baseline Currently requiring 6 L of oxygen by nasal cannula to maintain O2 saturation greater than 90% Independent review chest x-ray done on 01/05/2019 which shows bilateral pulmonary infiltrates with bilateral pleural effusion  Paroxysmal A. fib Currently in normal sinus rhythm On Eliquis  Type 2 diabetes with hyperglycemia Hemoglobin A1c 7.6 On prednisone Continue insulin sliding scale  Chronic anxiety/depression Continue Zoloft  Chronic microcytic anemia Hemoglobin at baseline 8 with MCV greater than 100 Hemoglobin dropped to 7.7 No sign of overt bleeding  Acute thrombocytopenia Platelet of 194 Monitor repeat CBC in the morning   DVT prophylaxis: eliquis Code Status: DNR Family Communication: Pt in room, family not at bedside Disposition Plan: Uncertain at this time  Consultants:   Palliative Care   Objective: Vitals:   01/09/19 2014 01/09/19 2020 01/10/19 0700 01/10/19 1332  BP:  (!) 119/93    Pulse:  95    Resp:      Temp:  98.4 F (36.9 C)    TempSrc:  Oral    SpO2: 100% 100%  100%  Weight:   58.8 kg     Intake/Output Summary (Last 24 hours) at 01/10/2019 1450 Last data filed at 01/10/2019 1129 Gross per 24 hour  Intake 465 ml  Output 400 ml  Net 65 ml   Filed Weights   01/08/19 0426 01/09/19 0456 01/10/19 0700  Weight: 62.4 kg 60.8 kg 58.8 kg    Exam:  . General: 73 y.o. year-old male  chronically ill-appearing in no acute distress.  Confused.   . Cardiovascular: Regular rate and rhythm with no rubs or gallops.  No thyromegaly or JVD noted.   Marland Kitchen Respiratory: Mild rales at bases with no wheezes.  Poor inspiratory effort.  . Abdomen: Soft nontender nondistended with normal bowel sounds x4 quadrants. . Musculoskeletal: Trace lower extremity edema. 2/4 pulses in all 4  extremities. Marland Kitchen Psychiatry: Unable to assess mood due to altered mental status.   Data Reviewed: CBC: Recent Labs  Lab 01/10/19 0500  WBC 5.8  HGB 7.7*  HCT 27.8*  MCV 103.7*  PLT 104*   Basic Metabolic Panel: Recent Labs  Lab 01/05/19 0657 01/10/19 0500  NA 143 143  K 4.2 4.4  CL 81* 85*  CO2 >50* >50*  GLUCOSE 122* 108*  BUN 16 22  CREATININE 0.45* 0.57*  CALCIUM 9.1 9.3   GFR: CrCl cannot be calculated (Unknown ideal weight.). Liver Function Tests: No results for input(s): AST, ALT, ALKPHOS, BILITOT, PROT, ALBUMIN in the last 168 hours. No results for input(s): LIPASE, AMYLASE in the last 168 hours. No results for input(s): AMMONIA in the last 168 hours. Coagulation Profile: No results for input(s): INR, PROTIME in the last 168 hours. Cardiac Enzymes: No results for input(s): CKTOTAL, CKMB, CKMBINDEX, TROPONINI in the last 168 hours. BNP (last 3 results) No results for input(s): PROBNP in the last 8760 hours. HbA1C: No results for input(s): HGBA1C in the last 72 hours. CBG: Recent Labs  Lab 01/10/19 0031 01/10/19 0158 01/10/19 0358 01/10/19 0838 01/10/19 1130  GLUCAP 98 125* 114* 116* 266*   Lipid Profile: No results for input(s): CHOL, HDL, LDLCALC, TRIG, CHOLHDL, LDLDIRECT in the last 72 hours. Thyroid Function Tests: No results for input(s): TSH, T4TOTAL, FREET4, T3FREE, THYROIDAB in the last 72 hours. Anemia Panel: No results for input(s): VITAMINB12, FOLATE, FERRITIN, TIBC, IRON, RETICCTPCT in the last 72 hours. Urine analysis:    Component Value Date/Time   COLORURINE AMBER (A) Jan 15, 2019 2036   APPEARANCEUR CLOUDY (A) 01/15/2019 2036   LABSPEC 1.020 01-15-2019 2036   PHURINE 5.0 2019/01/15 2036   GLUCOSEU >=500 (A) Jan 15, 2019 2036   HGBUR NEGATIVE 01-15-19 2036   BILIRUBINUR NEGATIVE 01-15-19 2036   KETONESUR NEGATIVE 2019/01/15 2036   PROTEINUR 100 (A) 01/15/2019 2036   NITRITE NEGATIVE 01-15-19 2036   LEUKOCYTESUR NEGATIVE  01/15/19 2036   Sepsis Labs: (procalcitonin:4,lacticidven:4)  ) Recent Results (from the past 240 hour(s))  Respiratory Panel by PCR     Status: None   Collection Time: 01/02/19  9:24 AM  Result Value Ref Range Status   Adenovirus NOT DETECTED NOT DETECTED Final   Coronavirus 229E NOT DETECTED NOT DETECTED Final    Comment: (NOTE) The Coronavirus on the Respiratory Panel, DOES NOT test for the novel  Coronavirus (2019 nCoV)    Coronavirus HKU1 NOT DETECTED NOT DETECTED Final   Coronavirus NL63 NOT DETECTED NOT DETECTED Final   Coronavirus OC43 NOT DETECTED NOT DETECTED Final   Metapneumovirus NOT DETECTED NOT DETECTED Final   Rhinovirus / Enterovirus NOT DETECTED NOT DETECTED Final   Influenza A NOT DETECTED NOT DETECTED Final   Influenza B NOT DETECTED NOT DETECTED Final   Parainfluenza Virus 1 NOT DETECTED NOT DETECTED Final   Parainfluenza Virus 2 NOT DETECTED NOT DETECTED Final   Parainfluenza Virus 3 NOT DETECTED NOT DETECTED Final   Parainfluenza Virus 4 NOT DETECTED NOT DETECTED Final   Respiratory Syncytial Virus NOT DETECTED NOT DETECTED Final   Bordetella pertussis  NOT DETECTED NOT DETECTED Final   Chlamydophila pneumoniae NOT DETECTED NOT DETECTED Final   Mycoplasma pneumoniae NOT DETECTED NOT DETECTED Final    Comment: Performed at [facility], [address], [place], [place] [address]  SARS Coronavirus 2 (CEPHEID - Performed in [facility] hospital lab), Hosp Order     Status: None   Collection Time: 01/02/19  9:24 AM  Result Value Ref Range Status   SARS Coronavirus 2 NEGATIVE NEGATIVE Final    Comment: (NOTE) If result is NEGATIVE SARS-CoV-2 target nucleic acids are NOT DETECTED. The SARS-CoV-2 RNA is generally detectable in upper and lower  respiratory specimens during the acute phase of infection. The lowest  concentration of SARS-CoV-2 viral copies this assay can detect is 250  copies / mL. A negative result does not preclude SARS-CoV-2  infection  and should not be used as the sole basis for treatment or other  patient management decisions.  A negative result may occur with  improper specimen collection / handling, submission of specimen other  than nasopharyngeal swab, presence of viral mutation(s) within the  areas targeted by this assay, and inadequate number of viral copies  (<250 copies / mL). A negative result must be combined with clinical  observations, patient history, and epidemiological information. If result is POSITIVE SARS-CoV-2 target nucleic acids are DETECTED. The SARS-CoV-2 RNA is generally detectable in upper and lower  respiratory specimens dur ing the acute phase of infection.  Positive  results are indicative of active infection with SARS-CoV-2.  Clinical  correlation with patient history and other diagnostic information is  necessary to determine patient infection status.  Positive results do  not rule out bacterial infection or co-infection with other viruses. If result is PRESUMPTIVE POSTIVE SARS-CoV-2 nucleic acids MAY BE PRESENT.   A presumptive positive result was obtained on the submitted specimen  and confirmed on repeat testing.  While 2019 novel coronavirus  (SARS-CoV-2) nucleic acids may be present in the submitted sample  additional confirmatory testing may be necessary for epidemiological  and / or clinical management purposes  to differentiate between  SARS-CoV-2 and other Sarbecovirus currently known to infect humans.  If clinically indicated additional testing with an alternate test  methodology [phone]) is advised. The SARS-CoV-2 RNA is generally  detectable in upper and lower respiratory sp ecimens during the acute  phase of infection. The expected result is Negative. Fact Sheet for Patients:  BoilerBrush.com.cy Fact Sheet for Healthcare Providers: https://pope.com/ This test is not yet approved or cleared by the [place]  FDA and has been authorized for detection and/or diagnosis of SARS-CoV-2 by FDA under an Emergency Use Authorization (EUA).  This EUA will remain in effect (meaning this test can be used) for the duration of the COVID-19 declaration under Section 564(b)(1) of the Act, 21 U.S.C. section 360bbb-3(b)(1), unless the authorization is terminated or revoked sooner. Performed at [facility], [address] [place], [place] [address]       Studies: No results found.  Scheduled Meds: . apixaban  5 mg Oral BID  . aspirin EC  81 mg Oral Daily  . atorvastatin  20 mg Oral q1800  . dextromethorphan-guaiFENesin  1 tablet Oral BID  . famotidine  20 mg Oral Daily  . feeding supplement (PRO-STAT SUGAR FREE 64)  30 mL Oral BID  . fluticasone  2 spray Each Nare Daily  . furosemide  40 mg Oral Daily  . insulin aspart  0-9 Units Subcutaneous Q4H  .  insulin aspart  3 Units Subcutaneous TID WC  . ipratropium-albuterol  3 mL Nebulization TID  . predniSONE  10 mg Oral Q breakfast  . Ensure Max Protein  11 oz Oral Daily  . sertraline  50 mg Oral Daily  . sodium chloride flush  10-40 mL Intracatheter Q12H    Continuous Infusions:   LOS: 15 days     Darlin Drop, MD [facility] Pager [phone]  If 7PM-7AM, please contact night-coverage [email or web address] Password [email or web address] 01/10/2019, 2:50 PM

## 2019-01-11 DIAGNOSIS — R0602 Shortness of breath: Secondary | ICD-10-CM

## 2019-01-11 LAB — BLOOD GAS, ARTERIAL
Acid-Base Excess: 29.1 mmol/L — ABNORMAL HIGH (ref 0.0–2.0)
Bicarbonate: 56.8 mmol/L — ABNORMAL HIGH (ref 20.0–28.0)
Drawn by: 331761
O2 Content: 6 L/min
O2 Saturation: 98 %
Patient temperature: 98.1
pCO2 arterial: 110 mmHg (ref 32.0–48.0)
pH, Arterial: 7.329 — ABNORMAL LOW (ref 7.350–7.450)
pO2, Arterial: 108 mmHg (ref 83.0–108.0)

## 2019-01-11 LAB — GLUCOSE, CAPILLARY
Glucose-Capillary: 121 mg/dL — ABNORMAL HIGH (ref 70–99)
Glucose-Capillary: 163 mg/dL — ABNORMAL HIGH (ref 70–99)
Glucose-Capillary: 224 mg/dL — ABNORMAL HIGH (ref 70–99)
Glucose-Capillary: 245 mg/dL — ABNORMAL HIGH (ref 70–99)
Glucose-Capillary: 280 mg/dL — ABNORMAL HIGH (ref 70–99)
Glucose-Capillary: 88 mg/dL (ref 70–99)
Glucose-Capillary: 93 mg/dL (ref 70–99)

## 2019-01-11 LAB — CBC
HCT: 28 % — ABNORMAL LOW (ref 39.0–52.0)
Hemoglobin: 7.9 g/dL — ABNORMAL LOW (ref 13.0–17.0)
MCH: 29.3 pg (ref 26.0–34.0)
MCHC: 28.2 g/dL — ABNORMAL LOW (ref 30.0–36.0)
MCV: 103.7 fL — ABNORMAL HIGH (ref 80.0–100.0)
Platelets: 113 10*3/uL — ABNORMAL LOW (ref 150–400)
RBC: 2.7 MIL/uL — ABNORMAL LOW (ref 4.22–5.81)
RDW: 15 % (ref 11.5–15.5)
WBC: 5.3 10*3/uL (ref 4.0–10.5)
nRBC: 0 % (ref 0.0–0.2)

## 2019-01-11 LAB — COMPREHENSIVE METABOLIC PANEL
ALT: 20 U/L (ref 0–44)
AST: 18 U/L (ref 15–41)
Albumin: 2.6 g/dL — ABNORMAL LOW (ref 3.5–5.0)
Alkaline Phosphatase: 35 U/L — ABNORMAL LOW (ref 38–126)
BUN: 19 mg/dL (ref 8–23)
CO2: >50 mmol/L — ABNORMAL HIGH (ref 22–32)
Calcium: 9.8 mg/dL (ref 8.9–10.3)
Chloride: 84 mmol/L — ABNORMAL LOW (ref 98–111)
Creatinine, Ser: 0.56 mg/dL — ABNORMAL LOW (ref 0.61–1.24)
GFR calc Af Amer: >60 mL/min (ref >60)
GFR calc non Af Amer: >60 mL/min (ref >60)
Glucose, Bld: 124 mg/dL — ABNORMAL HIGH (ref 70–99)
Potassium: 4.4 mmol/L (ref 3.5–5.1)
Sodium: 144 mmol/L (ref 135–145)
Total Bilirubin: 0.5 mg/dL (ref 0.3–1.2)
Total Protein: 5.9 g/dL — ABNORMAL LOW (ref 6.5–8.1)

## 2019-01-11 NOTE — Progress Notes (Signed)
Pt O2 Sat 100% with 6L HFNC, RR 33. Paged MD with ABG results and resp rate, orders to place on bipap. Pt is refusing. Will page MD with updates.

## 2019-01-11 NOTE — Progress Notes (Signed)
Occupational Therapy Treatment Patient Details Name: Gregory Stone MRN: [id number] DOB: [birth year] Today's Date: 01/11/2019    History of present illness 73 yo admitted from [facility] with AMS and SOB with HCAP covid (-). PMhx: COPD on home O2, HLD, DM, Afib, depression   OT comments  Pt tolerated sitting EOB for ~4 minutes on 5L HF O2 with SpO2 87-90%, leaning against elevated HOB on right side. Pt with increased effort in breathing while sitting EOB and requested to return back to bed. With return to supine, pt with desat to high 60's/low 70's.  Increased to 6L HF O2 and pt recovered to 93% within 2 minutes. RN made aware. Pt self feeding lunch at start of session and, once returned to bed, requested to eat peaches that were on his try. Set up assist to cut peaches but then pt able to self feed. Will continue to follow acutely.  Follow Up Recommendations  SNF    Equipment Recommendations  None recommended by OT    Recommendations for Other Services      Precautions / Restrictions Precautions Precautions: Fall Precaution Comments: home O2 3L Restrictions Weight Bearing Restrictions: No       Mobility Bed Mobility Overal bed mobility: Needs Assistance Bed Mobility: Supine to Sit;Sit to Supine     Supine to sit: Min assist;HOB elevated Sit to supine: Min assist;HOB elevated   General bed mobility comments: Increased time to complete transition. Verbal cues with assist for hand placement to elevate trunk OOB and bring hips to EOB.   Transfers                 General transfer comment: patient declined    Balance Overall balance assessment: Needs assistance Sitting-balance support: Bilateral upper extremity supported;Feet supported Sitting balance-Leahy Scale: Fair Sitting balance - Comments: seated EOB with UE support and R lateral lean towards elevated HOB Postural control: Right lateral lean                                 ADL either  performed or assessed with clinical judgement   ADL Overall ADL's : Needs assistance/impaired Eating/Feeding: Set up;Bed level Eating/Feeding Details (indicate cue type and reason): Pt self feeding lunch on OT arrival (self feeding meat and carrots with fork). Assist to cut peaches but then able to scoop with spoon once cut.                                     General ADL Comments: Pt tolerated sitting EOB ~4 minutes, leaning heavily against elevated HOB (right side).     Vision       Perception     Praxis      Cognition Arousal/Alertness: Awake/alert Behavior During Therapy: Flat affect Overall Cognitive Status: History of cognitive impairments - at baseline Area of Impairment: Orientation;Safety/judgement;Awareness                 Orientation Level: Disoriented to;Time Current Attention Level: Sustained Memory: Decreased short-term memory Following Commands: Follows one step commands consistently;Follows multi-step commands with increased time Safety/Judgement: Decreased awareness of safety   Problem Solving: Slow processing;Difficulty sequencing;Requires verbal cues          Exercises     Shoulder Instructions       General Comments 5L HF with SpO2 92% at rest; after 5 min sitting  drop to 87-90%, when returning back to supine drop to high 60's low 70's - increase to 6L HF with patient able to recover to 93% after ~2 minutes    Pertinent Vitals/ Pain       Pain Assessment: Faces Faces Pain Scale: Hurts little more Pain Location: generalized Pain Descriptors / Indicators: Discomfort;Grimacing Pain Intervention(s): Limited activity within patient's tolerance;Monitored during session;Repositioned  Home Living                                          Prior Functioning/Environment              Frequency  Min 2X/week        Progress Toward Goals  OT Goals(current goals can now be found in the care plan section)   Progress towards OT goals: Progressing toward goals  Acute Rehab OT Goals Patient Stated Goal: feel better OT Goal Formulation: With patient Time For Goal Achievement: 01/12/19 Potential to Achieve Goals: Good ADL Goals Pt Will Perform Grooming: with min assist;standing Pt Will Perform Upper Body Bathing: with min assist;sitting Pt Will Perform Upper Body Dressing: with set-up;sitting Pt Will Transfer to Toilet: with min assist;ambulating;bedside commode  Plan Discharge plan remains appropriate;Frequency remains appropriate    Co-evaluation      Reason for Co-Treatment: Complexity of the patient's impairments (multi-system involvement);For patient/therapist safety;To address functional/ADL transfers PT goals addressed during session: Mobility/safety with mobility        AM-PAC OT "6 Clicks" Daily Activity     Outcome Measure   Help from another person eating meals?: A Little Help from another person taking care of personal grooming?: A Lot Help from another person toileting, which includes using toliet, bedpan, or urinal?: A Lot Help from another person bathing (including washing, rinsing, drying)?: A Lot Help from another person to put on and taking off regular upper body clothing?: A Lot Help from another person to put on and taking off regular lower body clothing?: Total 6 Click Score: 12    End of Session Equipment Utilized During Treatment: Oxygen  OT Visit Diagnosis: Other abnormalities of gait and mobility (R26.89);Muscle weakness (generalized) (M62.81);Unsteadiness on feet (R26.81)   Activity Tolerance Patient limited by fatigue   Patient Left in bed;with call bell/phone within reach;with bed alarm set   Nurse Communication Mobility status(SpO2 sats)        Time: [id number] OT Time Calculation (min): 23 min  Charges: OT General Charges $OT Visit: 1 Visit OT Treatments $Self Care/Home Management : 8-22 mins     Cipriano MileJohnson,  Elizabeth OTR/L Acute  Rehabilitation Services [phone] 01/11/2019, 2:38 PM

## 2019-01-11 NOTE — Progress Notes (Signed)
Pt placed on BIPAP for night time rest.  Patient agrees to wear at this time.

## 2019-01-11 NOTE — Progress Notes (Signed)
PROGRESS NOTE  Gregory Stone XBM:[id number] DOB: May 16, 1946 DOA: 01/01/2019 PCP: Patient, No Pcp Per  HPI/Recap of past 24 hours: 73 y.o.BM PMHx hyperlipidemia, diabetes mellitus type II uncontrolled with complication, COPDon3 L O2 at home, atrial fibrillation on Eliquis,depression, presented with altered mental status and shortness of breath. Per report, he had well shortness of breath in the facility, found to have O2 sats in 50s. He was given breathing treatments and had also been treated with a dose of Ativan then patient became minimally responsive. In ED he was barely arousable. In ED patient was found to have negative COVID test, negative flu test WBC is 12.3, lactic acid 4.9, troponin 0.03, UA suggestive of UTI. A. fib with RVR, O2 sats 100% on 5 L. ABG showed pH of 7.18, PCO2 103, PO2 142. Chest x-ray showed left upper lobe infiltrates. CT head negative.  01/11/19: Patient seen and examined at bedside.  More alert today and answering questions appropriately.  Alert oriented x3.  Currently on 5 L of oxygen high flow nasal cannula and saturating 99 to 100%.  Will wean off O2 supplementation back to his baseline as tolerated.   Assessment/Plan: Principal Problem:   HCAP (healthcare-associated pneumonia) Active Problems:   HLD (hyperlipidemia)   Diabetes mellitus without complication (HCC)   Depression   COPD (chronic obstructive pulmonary disease) (HCC)   Atrial fibrillation with RVR (HCC)   Acute on chronic respiratory failure with hypoxia (HCC)   Acute metabolic encephalopathy   Sepsis (HCC)   Hyperkalemia   Abnormal LFTs   Elevated troponin   COPD exacerbation (HCC)   Sepsis due to pneumonia (HCC)   Diabetes mellitus type 2, uncontrolled, with complications (HCC)   Anxiety   Acute on chronic respiratory failure with hypoxia and hypercapnia (HCC)   Palliative care encounter   End stage COPD (HCC)   Acute respiratory failure with hypercapnia (HCC)  Improving  acute metabolic encephalopathy CT head unremarkable for any acute intracranial findings  Acute on chronic hypoxic hypercarbic respiratory failure secondary to HCAP, improving On 3 L of oxygen at baseline Currently requiring 5L of oxygen by nasal cannula with sat 99-100% Home O2 evaluation Wean off oxygen supplementation down to baseline Independently reviewed chest x-ray done on 01/05/2019 which shows bilateral pulmonary infiltrates with bilateral pleural effusion  Contracture metabolic alkalosis Bicarb greater than 50 Hold off Lasix Repeat BMP in the morning Obtain ABG  Paroxysmal A. fib Currently in normal sinus rhythm On Eliquis   Type 2 diabetes with hyperglycemia Hemoglobin A1c 7.6 On prednisone Continue insulin sliding scale  Chronic anxiety/depression Continue Zoloft  Chronic macrocytic anemia Hemoglobin at baseline 8 with MCV greater than 100 Hemoglobin appears to be at his baseline 7.9 No sign of overt bleeding  Acute thrombocytopenia Platelet 113  Continue to monitor   DVT prophylaxis: eliquis Code Status: DNR Family Communication: Pt in room, family not at bedside Disposition Plan: Uncertain at this time  Consultants:   Palliative Care   Objective: Vitals:   01/10/19 2102 01/11/19 0535 01/11/19 1200 01/11/19 1429  BP: 137/78 128/67    Pulse: 87 88 97   Resp: 14 (!) 28 (!) 30   Temp: 98.5 F (36.9 C) 98.1 F (36.7 C)    TempSrc: Oral Oral    SpO2: 100% 100% 95% 98%  Weight:  57.8 kg    Height:  5' 8.5" (1.74 m)      Intake/Output Summary (Last 24 hours) at 01/11/2019 1515 Last data filed at 01/11/2019 0120 Gross  per 24 hour  Intake 660 ml  Output 100 ml  Net 560 ml   Filed Weights   01/09/19 0456 01/10/19 0700 01/11/19 0535  Weight: 60.8 kg 58.8 kg 57.8 kg    Exam:  . General: 73 y.o. year-old male chronically ill-appearing in no acute distress.  Alert and oriented x3.   . Cardiovascular: Regular rate and rhythm with no rubs or  gallops.  No JVD or thyromegaly noted. Marland Kitchen Respiratory: Mild rales at bases with no wheezes noted.  Poor inspiratory effort. . Abdomen: Soft nontender nondistended with normal bowel sounds x4 quadrants.   . Musculoskeletal: Trace lower extremity edema. 2/4 pulses in all 4 extremities. Marland Kitchen Psychiatry: Mood is appropriate for condition and setting.   Data Reviewed: CBC: Recent Labs  Lab 01/10/19 0500 01/11/19 0706  WBC 5.8 5.3  HGB 7.7* 7.9*  HCT 27.8* 28.0*  MCV 103.7* 103.7*  PLT 104* 113*   Basic Metabolic Panel: Recent Labs  Lab 01/05/19 0657 01/10/19 0500 01/11/19 0706  NA 143 143 144  K 4.2 4.4 4.4  CL 81* 85* 84*  CO2 >50* >50* >50*  GLUCOSE 122* 108* 124*  BUN 16 22 19   CREATININE 0.45* 0.57* 0.56*  CALCIUM 9.1 9.3 9.8   GFR: Estimated Creatinine Clearance: 70.2 mL/min (A) (by C-G formula based on SCr of 0.56 mg/dL (L)). Liver Function Tests: Recent Labs  Lab 01/11/19 0706  AST 18  ALT 20  ALKPHOS 35*  BILITOT 0.5  PROT 5.9*  ALBUMIN 2.6*   No results for input(s): LIPASE, AMYLASE in the last 168 hours. No results for input(s): AMMONIA in the last 168 hours. Coagulation Profile: No results for input(s): INR, PROTIME in the last 168 hours. Cardiac Enzymes: No results for input(s): CKTOTAL, CKMB, CKMBINDEX, TROPONINI in the last 168 hours. BNP (last 3 results) No results for input(s): PROBNP in the last 8760 hours. HbA1C: No results for input(s): HGBA1C in the last 72 hours. CBG: Recent Labs  Lab 01/10/19 2342 01/11/19 0155 01/11/19 0517 01/11/19 0753 01/11/19 1135  GLUCAP 88 224* 163* 121* 280*   Lipid Profile: No results for input(s): CHOL, HDL, LDLCALC, TRIG, CHOLHDL, LDLDIRECT in the last 72 hours. Thyroid Function Tests: No results for input(s): TSH, T4TOTAL, FREET4, T3FREE, THYROIDAB in the last 72 hours. Anemia Panel: No results for input(s): VITAMINB12, FOLATE, FERRITIN, TIBC, IRON, RETICCTPCT in the last 72 hours. Urine analysis:     Component Value Date/Time   COLORURINE AMBER (A) 12/28/2018 2036   APPEARANCEUR CLOUDY (A) 12/10/2018 2036   LABSPEC 1.020 12/23/2018 2036   PHURINE 5.0 12/19/2018 2036   GLUCOSEU >=500 (A) 12/09/2018 2036   HGBUR NEGATIVE 12/16/2018 2036   BILIRUBINUR NEGATIVE 12/13/2018 2036   KETONESUR NEGATIVE 12/01/2018 2036   PROTEINUR 100 (A) 12/16/2018 2036   NITRITE NEGATIVE 12/19/2018 2036   LEUKOCYTESUR NEGATIVE 12/10/2018 2036   Sepsis Labs: @LABRCNTIP (procalcitonin:4,lacticidven:4)  ) Recent Results (from the past 240 hour(s))  Respiratory Panel by PCR     Status: None   Collection Time: 01/02/19  9:24 AM  Result Value Ref Range Status   Adenovirus NOT DETECTED NOT DETECTED Final   Coronavirus 229E NOT DETECTED NOT DETECTED Final    Comment: (NOTE) The Coronavirus on the Respiratory Panel, DOES NOT test for the novel  Coronavirus (2019 nCoV)    Coronavirus HKU1 NOT DETECTED NOT DETECTED Final   Coronavirus NL63 NOT DETECTED NOT DETECTED Final   Coronavirus OC43 NOT DETECTED NOT DETECTED Final   Metapneumovirus NOT DETECTED NOT  DETECTED Final   Rhinovirus / Enterovirus NOT DETECTED NOT DETECTED Final   Influenza A NOT DETECTED NOT DETECTED Final   Influenza B NOT DETECTED NOT DETECTED Final   Parainfluenza Virus 1 NOT DETECTED NOT DETECTED Final   Parainfluenza Virus 2 NOT DETECTED NOT DETECTED Final   Parainfluenza Virus 3 NOT DETECTED NOT DETECTED Final   Parainfluenza Virus 4 NOT DETECTED NOT DETECTED Final   Respiratory Syncytial Virus NOT DETECTED NOT DETECTED Final   Bordetella pertussis NOT DETECTED NOT DETECTED Final   Chlamydophila pneumoniae NOT DETECTED NOT DETECTED Final   Mycoplasma pneumoniae NOT DETECTED NOT DETECTED Final    Comment: Performed at [facility], [address] [place], [place] [address]  SARS Coronavirus 2 (CEPHEID - Performed in [facility] hospital lab), Hosp Order     Status: None   Collection Time: 01/02/19  9:24 AM  Result Value Ref  Range Status   SARS Coronavirus 2 NEGATIVE NEGATIVE Final    Comment: (NOTE) If result is NEGATIVE SARS-CoV-2 target nucleic acids are NOT DETECTED. The SARS-CoV-2 RNA is generally detectable in upper and lower  respiratory specimens during the acute phase of infection. The lowest  concentration of SARS-CoV-2 viral copies this assay can detect is 250  copies / mL. A negative result does not preclude SARS-CoV-2 infection  and should not be used as the sole basis for treatment or other  patient management decisions.  A negative result may occur with  improper specimen collection / handling, submission of specimen other  than nasopharyngeal swab, presence of viral mutation(s) within the  areas targeted by this assay, and inadequate number of viral copies  (<250 copies / mL). A negative result must be combined with clinical  observations, patient history, and epidemiological information. If result is POSITIVE SARS-CoV-2 target nucleic acids are DETECTED. The SARS-CoV-2 RNA is generally detectable in upper and lower  respiratory specimens dur ing the acute phase of infection.  Positive  results are indicative of active infection with SARS-CoV-2.  Clinical  correlation with patient history and other diagnostic information is  necessary to determine patient infection status.  Positive results do  not rule out bacterial infection or co-infection with other viruses. If result is PRESUMPTIVE POSTIVE SARS-CoV-2 nucleic acids MAY BE PRESENT.   A presumptive positive result was obtained on the submitted specimen  and confirmed on repeat testing.  While 2019 novel coronavirus  (SARS-CoV-2) nucleic acids may be present in the submitted sample  additional confirmatory testing may be necessary for epidemiological  and / or clinical management purposes  to differentiate between  SARS-CoV-2 and other Sarbecovirus currently known to infect humans.  If clinically indicated additional testing with an  alternate test  methodology [phone]) is advised. The SARS-CoV-2 RNA is generally  detectable in upper and lower respiratory sp ecimens during the acute  phase of infection. The expected result is Negative. Fact Sheet for Patients:  BoilerBrush.com.cy Fact Sheet for Healthcare Providers: https://pope.com/ This test is not yet approved or cleared by the [place] FDA and has been authorized for detection and/or diagnosis of SARS-CoV-2 by FDA under an Emergency Use Authorization (EUA).  This EUA will remain in effect (meaning this test can be used) for the duration of the COVID-19 declaration under Section 564(b)(1) of the Act, 21 U.S.C. section 360bbb-3(b)(1), unless the authorization is terminated or revoked sooner. Performed at [facility], [address], [place], [place] [address]       Studies: No results  found.  Scheduled Meds: . apixaban  5 mg Oral BID  . aspirin EC  81 mg Oral Daily  . atorvastatin  20 mg Oral q1800  . dextromethorphan-guaiFENesin  1 tablet Oral BID  . famotidine  20 mg Oral Daily  . feeding supplement (PRO-STAT SUGAR FREE 64)  30 mL Oral BID  . fluticasone  2 spray Each Nare Daily  . insulin aspart  0-9 Units Subcutaneous Q4H  . insulin aspart  3 Units Subcutaneous TID WC  . ipratropium-albuterol  3 mL Nebulization TID  . predniSONE  10 mg Oral Q breakfast  . Ensure Max Protein  11 oz Oral Daily  . sertraline  50 mg Oral Daily  . sodium chloride flush  10-40 mL Intracatheter Q12H    Continuous Infusions:   LOS: 16 days     Darlin Droparole N Lydia Meng, MD [facility] Pager [phone]  If 7PM-7AM, please contact night-coverage [email or web address] Password [email or web address] 01/11/2019, 3:15 PM

## 2019-01-11 NOTE — Progress Notes (Signed)
Daily Progress Note   Patient Name: Janine OresJoshua Headen       Date: 01/11/2019 DOB: June 08, 1946  Age: 73 y.o. MRN#: [id number] Attending Physician: Darlin DropHall, Carole N, DO Primary Care Physician: Patient, No Pcp Per Admit Date: Apr 08, 2019  Reason for Consultation/Follow-up: Establishing goals of care  Subjective: Patient is awake and alert. He denies pain or discomfort. Does endorse shortness of breath at times. He is able to speak in sentences without obvious signs of distress. However, it is reported with any exertion or movement patient desats and become increasingly short of breath. He reports he ate some breakfast, but not much because he feels tired and weak. He states "I get so weak the spoon slips right out of my hand or I feel like I can't hold it."   He states his daughter brought some of his things to the hospital yesterday and shows me his phone. States he is waiting for his son to call him back.   I spoke at length with his daughter, [name] regarding disposition and goals. She gets his overall condition and expresses family is aware of his continuous decline. She is tearful in stating they do not want to see him suffering and do not want him to continue to come back back and forth to the hospital. She shares memories of her father and how quality of life is important to him and his children. Support given. [name] states her father would not want to live working to breath and unable to provide some care for hisself.   She states she and her siblings are meeting this afternoon at 530pm to further discuss their final wishes. She knows they do not have the financial means to pay for private care givers or the ability to care for him in the home, they cannot afford to pay privately to a facility, she does  not want him to go to a facility for rehab and suffer with shortness of breath trying to follow instructions, knowing his condition is end-stage. She expresses she feels he would be more appropriate for hospice, being kept comfortable, and treating his symptoms keeping him comfortable. However, she wishes to have a final discussion with her siblings and make sure they are all in agreement and weigh all of their options while consider what is most important and best for  Mr. Demartini.   She reports she will call and follow up with with their final decision first thing tomorrow.    Assessment: Patient with end stage COPD and recurrent hypercapnia.    Likely needs BiPAP QHS and PRN.   Patient Profile/HPI:  73 y.o. male  with past medical history of COPD on 3L at home, Afib on eliquis, DM, and depression who was admitted on 12/15/2018 with AMS and SOB.  Reportedly oxygen sats were 50% at his facility prior to admission.   Mr. Perleberg was admitted and treated for COPD exacerbation. Unfortunately he has improved almost to discharge and then relapsed into hypercarbic respiratory failure twice. Pulmonary has consulted and recommended Palliative care be consulted.   Length of Stay: 16  Current Medications: Scheduled Meds:  . apixaban  5 mg Oral BID  . aspirin EC  81 mg Oral Daily  . atorvastatin  20 mg Oral q1800  . dextromethorphan-guaiFENesin  1 tablet Oral BID  . famotidine  20 mg Oral Daily  . feeding supplement (PRO-STAT SUGAR FREE 64)  30 mL Oral BID  . fluticasone  2 spray Each Nare Daily  . insulin aspart  0-9 Units Subcutaneous Q4H  . insulin aspart  3 Units Subcutaneous TID WC  . ipratropium-albuterol  3 mL Nebulization TID  . predniSONE  10 mg Oral Q breakfast  . Ensure Max Protein  11 oz Oral Daily  . sertraline  50 mg Oral Daily  . sodium chloride flush  10-40 mL Intracatheter Q12H    Continuous Infusions:   PRN Meds: albuterol, hydrALAZINE, ipratropium-albuterol, morphine  injection, morphine CONCENTRATE, ondansetron (ZOFRAN) IV, sodium chloride, sodium chloride flush  Physical Exam        Thin, frail, AA male, able to speak with ease, but notable work of breathing with exertion or movement. Awake and alert, periods of lethargy.  RRR, 5L/HFNC, abdomen soft, non-distended, non-tender.    Vital Signs: BP 128/67 (BP Location: Right Arm)   Pulse 97   Temp 98.1 F (36.7 C) (Oral)   Resp (!) 30   Ht 5' 8.5" (1.74 m)   Wt 57.8 kg   SpO2 98%   BMI 19.09 kg/m  SpO2: SpO2: 98 % O2 Device: O2 Device: High Flow Nasal Cannula O2 Flow Rate: O2 Flow Rate (L/min): 6 L/min  Intake/output summary:   Intake/Output Summary (Last 24 hours) at 01/11/2019 1502 Last data filed at 01/11/2019 0120 Gross per 24 hour  Intake 660 ml  Output 100 ml  Net 560 ml   LBM: Last BM Date: 01/07/19 Baseline Weight: Weight: 61.7 kg Most recent weight: Weight: 57.8 kg       Palliative Assessment/Data:  PPS 30%   Flowsheet Rows     Most Recent Value  Intake Tab  Referral Department  Hospitalist  Unit at Time of Referral  Med/Surg Unit  Date Notified  01/05/19  Palliative Care Type  New Palliative care  Reason for referral  Clarify Goals of Care  Date of Admission  12/15/2018  Date first seen by Palliative Care  01/06/19  # of days Palliative referral response time  1 Day(s)  # of days IP prior to Palliative referral  11  Clinical Assessment  Psychosocial & Spiritual Assessment  Palliative Care Outcomes      Patient Active Problem List   Diagnosis Date Noted  . End stage COPD (HCC)   . Acute respiratory failure with hypercapnia (HCC)   . Acute on chronic respiratory failure with hypoxia and  hypercapnia (HCC)   . Palliative care encounter   . Sepsis due to pneumonia (HCC) 12/27/2018  . Diabetes mellitus type 2, uncontrolled, with complications (HCC) 12/27/2018  . Anxiety 12/27/2018  . COPD exacerbation (HCC) 12/26/2018  . Pressure injury of skin 12/26/2018  . COPD  (chronic obstructive pulmonary disease) (HCC) 12/03/2018  . Atrial fibrillation with RVR (HCC) 12/11/2018  . Acute on chronic respiratory failure with hypoxia (HCC) 12/02/2018  . Acute metabolic encephalopathy 12/10/2018  . Sepsis (HCC) 12/24/2018  . Hyperkalemia 12/09/2018  . Abnormal LFTs 12/19/2018  . Elevated troponin 12/27/2018  . HCAP (healthcare-associated pneumonia) 12/26/2018  . HLD (hyperlipidemia)   . Diabetes mellitus without complication (HCC)   . Depression     Palliative Care Plan    Recommendations/Plan:  Not eligible for LTAC.  He was at [facility] until 4/1  Continue use of BiPAP QHS and PRN.   Continue morphine SL PRN for shortness of breath/pain   Hospice House eligible if not improved using BiPAP QHS and PRN or if family decides to transition to comfort and wean off of HFNC and manage symptoms.   Family remains hopeful but somewhat concerned he will continue to decline an suffer. They are meeting as a unit and discussing in detail this afternoon at 530pm to come to a final decision. Daughter is planning on following up tomorrow with their decisions. Comfort approach versus SNF.   PMT will support and follow.    Goals of Care and Additional Recommendations:  Limitations on Scope of Treatment: Full Scope Treatment  Code Status:  DNR  Prognosis:  With out BiPAP he would die in days.  With consistent appropriate BiPAP use he could have months.  Discharge Planning:  To Be Determined  Care plan was discussed with Daughter.  Thank you for allowing the Palliative Medicine Team to assist in the care of this patient.  Total Time: 65 min.  Greater than 50%  of this time was spent counseling and coordinating care related to the above assessment and plan.  Willette Alma, AGPCNP-BC Palliative Medicine Team  Phone: [phone] Pager: [phone] Amion: Thea Alken     Please contact Palliative MedicineTeam phone at [phone] for questions  and concerns between 7 am - 7 pm.   Please see AMION for individual provider pager numbers.

## 2019-01-11 NOTE — Progress Notes (Signed)
Physical Therapy Treatment Patient Details Name: Gregory Stone MRN: [id number] DOB: 03/07/1946 Today's Date: 01/11/2019    History of Present Illness 73 yo admitted from [facility] [facility] with AMS and SOB with HCAP covid (-). PMhx: COPD on home O2, HLD, DM, Afib, depression    PT Comments    Patient seen to progress mobility. On 5L HF O2 with SpO2 92% at rest. Patient agreeable to sit EOB with PT/OT. Requires Min A with verbal and tactile cueing to come to EOB with increased time and effort from patient. Patient tolerating sitting EOB x 5 min with required UE support as well as lateral lean towards elevated HOB with noted increased work of breathing. SpO2 87-90% while sitting. Upon return to supine, patient desat to high 60's/low 70's - increased to 6L HF O2 with patient recovering to 93% with ~2 minutes - nursing notified. No subjective complaints with patient eating and all needs met at end of session. PT to continue to follow.     Follow Up Recommendations  SNF;Supervision/Assistance - 24 hour     Equipment Recommendations  None recommended by PT    Recommendations for Other Services       Precautions / Restrictions Precautions Precautions: Fall Precaution Comments: home O2 3L Restrictions Weight Bearing Restrictions: No    Mobility  Bed Mobility Overal bed mobility: Needs Assistance Bed Mobility: Supine to Sit;Sit to Supine     Supine to sit: Min assist;HOB elevated Sit to supine: Min assist;HOB elevated   General bed mobility comments: increased time and effort; cueing for hand placement; Min A for trunk control and for LE management; sat EOB x 5 min - required R lateral lean with head resting on pillow  Transfers                 General transfer comment: patient declined  Ambulation/Gait                 Stairs             Wheelchair Mobility    Modified Rankin (Stroke Patients Only)       Balance Overall balance assessment: Needs  assistance Sitting-balance support: Bilateral upper extremity supported;Feet supported Sitting balance-Leahy Scale: Fair Sitting balance - Comments: seated EOB with UE support and R lateral lean towards elevated HOB Postural control: Right lateral lean                                  Cognition Arousal/Alertness: Awake/alert Behavior During Therapy: Flat affect Overall Cognitive Status: History of cognitive impairments - at baseline                         Following Commands: Follows one step commands consistently;Follows multi-step commands with increased time Safety/Judgement: Decreased awareness of safety   Problem Solving: Slow processing        Exercises      General Comments General comments (skin integrity, edema, etc.): 5L HF with SpO2 92% at rest; after 5 min sitting drop to 87-90%, when returning back to supine drop to high 60's low 70's - increase to 6L HF with patient able to recover to 93% after ~2 minutes      Pertinent Vitals/Pain Pain Assessment: Faces Faces Pain Scale: Hurts little more Pain Location: generalized Pain Descriptors / Indicators: Discomfort;Grimacing Pain Intervention(s): Limited activity within patient's tolerance;Monitored during session;Repositioned    Home Living  Prior Function            PT Goals (current goals can now be found in the care plan section) Acute Rehab PT Goals Patient Stated Goal: feel better PT Goal Formulation: With patient Time For Goal Achievement: 01/12/19 Potential to Achieve Goals: Fair Progress towards PT goals: Progressing toward goals    Frequency    Min 2X/week      PT Plan Current plan remains appropriate    Co-evaluation PT/OT/SLP Co-Evaluation/Treatment: Yes Reason for Co-Treatment: Complexity of the patient's impairments (multi-system involvement);For patient/therapist safety;To address functional/ADL transfers PT goals addressed during  session: Mobility/safety with mobility        AM-PAC PT "6 Clicks" Mobility   Outcome Measure  Help needed turning from your back to your side while in a flat bed without using bedrails?: A Little Help needed moving from lying on your back to sitting on the side of a flat bed without using bedrails?: A Little Help needed moving to and from a bed to a chair (including a wheelchair)?: A Lot Help needed standing up from a chair using your arms (e.g., wheelchair or bedside chair)?: A Lot Help needed to walk in hospital room?: Total Help needed climbing 3-5 steps with a railing? : Total 6 Click Score: 12    End of Session Equipment Utilized During Treatment: Oxygen Activity Tolerance: Patient limited by fatigue Patient left: in bed;with call bell/phone within reach;with bed alarm set Nurse Communication: Mobility status PT Visit Diagnosis: Muscle weakness (generalized) (M62.81)     Time: 2182-8833 PT Time Calculation (min) (ACUTE ONLY): 23 min  Charges:  $Therapeutic Activity: 8-22 mins                      Lanney Gins, PT, DPT Supplemental Physical Therapist 01/11/19 2:17 PM Pager: [phone] Office: [phone]

## 2019-01-12 LAB — BASIC METABOLIC PANEL
BUN: 30 mg/dL — ABNORMAL HIGH (ref 8–23)
CO2: >50 mmol/L — ABNORMAL HIGH (ref 22–32)
Calcium: 9.6 mg/dL (ref 8.9–10.3)
Chloride: 83 mmol/L — ABNORMAL LOW (ref 98–111)
Creatinine, Ser: 0.46 mg/dL — ABNORMAL LOW (ref 0.61–1.24)
GFR calc Af Amer: >60 mL/min (ref >60)
GFR calc non Af Amer: >60 mL/min (ref >60)
Glucose, Bld: 122 mg/dL — ABNORMAL HIGH (ref 70–99)
Potassium: 4.4 mmol/L (ref 3.5–5.1)
Sodium: 142 mmol/L (ref 135–145)

## 2019-01-12 LAB — GLUCOSE, CAPILLARY
Glucose-Capillary: 129 mg/dL — ABNORMAL HIGH (ref 70–99)
Glucose-Capillary: 153 mg/dL — ABNORMAL HIGH (ref 70–99)
Glucose-Capillary: 196 mg/dL — ABNORMAL HIGH (ref 70–99)

## 2019-01-12 MED ORDER — ONDANSETRON HCL 4 MG/2ML IJ SOLN: 4.0000 mg | Freq: Four times a day (QID) | INTRAMUSCULAR | Status: DC | PRN

## 2019-01-12 MED ORDER — ONDANSETRON 4 MG PO TBDP
4.0000 mg | ORAL_TABLET | Freq: Four times a day (QID) | ORAL | Status: DC | PRN
  Filled 2019-01-12: qty 1

## 2019-01-12 MED ORDER — GLYCOPYRROLATE 0.2 MG/ML IJ SOLN
0.2000 mg | INTRAMUSCULAR | Status: DC | PRN
  Administered 2019-01-21: 15:00:00 0.2 mg via INTRAVENOUS
  Filled 2019-01-12: qty 1

## 2019-01-12 MED ORDER — BIOTENE DRY MOUTH MT LIQD: 15.0000 mL | OROMUCOSAL | Status: DC | PRN

## 2019-01-12 MED ORDER — LORAZEPAM 2 MG/ML IJ SOLN
0.5000 mg | INTRAMUSCULAR | Status: DC | PRN
  Administered 2019-01-12 – 2019-01-21 (×8): 0.5 mg via INTRAVENOUS
  Filled 2019-01-12 (×8): qty 1

## 2019-01-12 MED ORDER — POLYVINYL ALCOHOL 1.4 % OP SOLN
1.0000 [drp] | Freq: Four times a day (QID) | OPHTHALMIC | Status: DC | PRN
  Filled 2019-01-12: qty 15

## 2019-01-12 NOTE — Plan of Care (Signed)
  Problem: Clinical Measurements: Goal: Will remain free from infection Outcome: Progressing Goal: Respiratory complications will improve Outcome: Progressing   Problem: Activity: Goal: Risk for activity intolerance will decrease Outcome: Progressing   Problem: Nutrition: Goal: Adequate nutrition will be maintained Outcome: Progressing   Problem: Safety: Goal: Ability to remain free from injury will improve Outcome: Progressing   

## 2019-01-12 NOTE — TOC Progression Note (Signed)
Transition of Care [facility]) - Progression Note    Patient Details  Name: Clearence Tworek MRN: [id number] Date of Birth: 1945/09/30  Transition of Care [facility]) CM/SW Contact  Donnie Coffin, [place] Phone Number: 01/12/2019, 3:16 PM  Clinical Narrative:    Family are open to residential hospice facilities in the [place]/ [place] area- CSW reached out to [facility] and sent referral. Please follow up to determine if they have any beds available at [phone]    Expected Discharge Plan: Skilled Nursing Facility Barriers to Discharge: No Barriers Identified  Expected Discharge Plan and Services Expected Discharge Plan: Skilled Nursing Facility In-house Referral: Clinical Social Work Discharge Planning Services: NA Post Acute Care Choice: Skilled Nursing Facility Living arrangements for the past 2 months: Skilled Nursing Facility Expected Discharge Date: 01/05/19               DME Arranged: N/A DME Agency: NA       HH Arranged: NA HH Agency: NA         Social Determinants of Health (SDOH) Interventions    Readmission Risk Interventions Readmission Risk Prevention Plan 12/29/2018  Transportation Screening Complete  PCP or Specialist Appt within 5-7 Days Complete  Home Care Screening Complete  Medication Review (RN CM) Complete

## 2019-01-12 NOTE — Progress Notes (Signed)
Daily Progress Note   Patient Name: Gregory Stone       Date: 01/12/2019 DOB: 06/16/1946  Age: 73 y.o. MRN#: [id number] Attending Physician: Kayleen Memos, DO Primary Care Physician: Patient, No Pcp Per Admit Date: 12/27/2018  Reason for Consultation/Follow-up: Establishing goals of care  Subjective: Patient is awake, alert, with confusion. He appears short of breath as conversation continues. Currently on 5L HFNC. Breakfast tray at bedside, some bites taken. Patient states he is weak and doesn't feel like eating much. States he cannot hold anything. Support given.   Daughter, Letta Median called and expressed her and her siblings met and had a detailed discussion last night. She reports they are all in mutual agreement that their wishes for Gregory Stone is to be kept comfortable and minimize any suffering. Support given. Daughter expressed their awareness that patient's condition will not get better and they do not wish for him to go to a facility to return back to the hospital or suffer due to shortness of breath or weakness.   I educated daughter on what comfort measures would look like. We discussed used of BiPAP and oxygen. Letta Median expressed understanding and verbalized wishes to keep patient comfort with comfort measures. She and her siblings live in [place] and [place] area. Their wishes are for patient to transfer to residential hospice facility with hopes it can be closer to them in the [place] area versus [place].   She expressed family would like to video conference with him later today after everyone is done working if possible. I have confirmed the department has available ipads and bedside RN aware and willing to assist when family is ready.   Support given and all questions  answered. Daughter knows to call with further questions or needs.    Assessment: Patient with end stage COPD and recurrent hypercapnia.    Likely needs BiPAP QHS and PRN.   Patient Profile/HPI:  73 y.o. male  with past medical history of COPD on 3L at home, Afib on eliquis, DM, and depression who was admitted on 12/13/2018 with AMS and SOB.  Reportedly oxygen sats were 50% at his facility prior to admission.   Gregory Stone was admitted and treated for COPD exacerbation. Unfortunately he has improved almost to discharge and then relapsed into hypercarbic respiratory failure twice. Pulmonary has consulted  and recommended Palliative care be consulted.   Length of Stay: 17  Current Medications: Scheduled Meds:    Continuous Infusions:   PRN Meds: antiseptic oral rinse, glycopyrrolate, LORazepam, morphine injection, morphine CONCENTRATE, ondansetron **OR** ondansetron (ZOFRAN) IV, polyvinyl alcohol  Physical Exam Vitals signs and nursing note reviewed.   Thin, frail, AA male,  Shortness of breath, generalized weakness, dyspneic with conversation, Awake and alert, confused   RRR, 5L/HFNC, abdomen soft, non-distended, non-tender.    Vital Signs: BP 110/80   Pulse 92   Temp 98.9 F (37.2 C) (Oral)   Resp (!) 23   Ht 5' 8.5" (1.74 m)   Wt 57.8 kg   SpO2 100%   BMI 19.09 kg/m  SpO2: SpO2: 100 % O2 Device: O2 Device: High Flow Nasal Cannula O2 Flow Rate: O2 Flow Rate (L/min): 6 L/min  Intake/output summary:   Intake/Output Summary (Last 24 hours) at 01/12/2019 1303 Last data filed at 01/12/2019 1100 Gross per 24 hour  Intake 222 ml  Output 750 ml  Net -528 ml   LBM: Last BM Date: 01/07/19 Baseline Weight: Weight: 61.7 kg Most recent weight: Weight: 57.8 kg       Palliative Assessment/Data:  PPS 20%   Flowsheet Rows     Most Recent Value  Intake Tab  Referral Department  Hospitalist  Unit at Time of Referral  Med/Surg Unit  Date Notified  01/05/19  Palliative Care  Type  New Palliative care  Reason for referral  Clarify Goals of Care  Date of Admission  12/10/2018  Date first seen by Palliative Care  01/06/19  # of days Palliative referral response time  1 Day(s)  # of days IP prior to Palliative referral  11  Clinical Assessment  Psychosocial & Spiritual Assessment  Palliative Care Outcomes      Patient Active Problem List   Diagnosis Date Noted  . End stage COPD ([place])   . Acute respiratory failure with hypercapnia ([place])   . Acute on chronic respiratory failure with hypoxia and hypercapnia (HCC)   . Palliative care encounter   . Sepsis due to pneumonia ([place]) 12/27/2018  . Diabetes mellitus type 2, uncontrolled, with complications ([place]) 74/07/8785  . Anxiety 12/27/2018  . COPD exacerbation ([place]) 12/26/2018  . Pressure injury of skin 12/26/2018  . COPD (chronic obstructive pulmonary disease) ([place]) 12/07/2018  . Atrial fibrillation with RVR ([place]) 12/07/2018  . Acute on chronic respiratory failure with hypoxia ([place]) 12/06/2018  . Acute metabolic encephalopathy 76/72/0947  . Sepsis ([place]) 12/20/2018  . Hyperkalemia 12/21/2018  . Abnormal LFTs 12/01/2018  . Elevated troponin 12/16/2018  . HCAP (healthcare-associated pneumonia) 12/13/2018  . HLD (hyperlipidemia)   . Diabetes mellitus without complication ([place])   . Depression     Palliative Care Plan    Recommendations/Plan:  DNR/DNI-as confirmed by family  Transition care to full comfort measures  Discussed with family, they have decided to transition to comfort and is requesting placement at hospice facility (preferably close to family in [place]) area.   Case Management referral for residential hospice.   Family requesting assistance with video conferencing to communicate with family. RN aware and plans to assist later this afternoon once family is available and ready.    Morphine PRN for shortness of breath/pain  Ativan PRN for agitation  Robinul PRN for excessive  secretions  Wean off HFNC to [place]  PMT will support and follow.    Goals of Care and Additional Recommendations:  Limitations on Scope of Treatment:  Full Comfort Care  Code Status:  DNR  Prognosis:  Days to Weeks in the setting of poor po intake, lethargy, hypoxic hypercarbic respiratory failure, end-stage COPD  Discharge Planning:  Hospice facility  Care plan was discussed with Daughter, Letta Median, bedside RN, and Dr. Nevada Crane.   Thank you for allowing the Palliative Medicine Team to assist in the care of this patient.  Total Time: 45 min.  Greater than 50%  of this time was spent counseling and coordinating care related to the above assessment and plan.  Alda Lea, AGPCNP-BC Palliative Medicine Team  Phone: [phone] Pager: [phone] Amion: Bjorn Pippin     Please contact Palliative MedicineTeam phone at [phone] for questions and concerns between 7 am - 7 pm.   Please see AMION for individual provider pager numbers.

## 2019-01-12 NOTE — Progress Notes (Signed)
PROGRESS NOTE  Janine OresJoshua Loy ZOX:[id number] DOB: 07-29-1946 DOA: 12/20/2018 PCP: Patient, No Pcp Per  HPI/Recap of past 24 hours: 73 y.o.BM PMHx hyperlipidemia, diabetes mellitus type II uncontrolled with complication, end stage COPD, chronic hypercarbic hypoxic respiratory failure, chronic atrial fibrillation on Eliquis,depression, who presented with altered mental status and shortness of breath. Per report, found to have O2 sats in 50s. He was given breathing treatments and had also been treated with a dose of Ativan then patient became minimally responsive. In ED he was barely arousable. Negative COVID test, negative flu test. WBC is 12.3, lactic acid 4.9, troponin 0.03, UA suggestive of UTI. A. fib with RVR, O2 sats 100% on 5 L. ABG showed pH of 7.18, PCO2 103, PO2 142. Chest x-ray showed left upper lobe infiltrates. CT head negative. Treated for HCAP. BIPAP dependent with severe hypercarbia with PCO2 110. Palliative care team following. On 01/12/19 decision made for comfort care only. No BIPAP per family.  01/12/19: Seen and examined at his bedside.  He is on BiPAP.  He wants the BiPAP removed.  After family discussion with palliative care provider patient is now comfort care only with no BiPAP.  Assessment/Plan: Principal Problem:   HCAP (healthcare-associated pneumonia) Active Problems:   HLD (hyperlipidemia)   Diabetes mellitus without complication (HCC)   Depression   COPD (chronic obstructive pulmonary disease) (HCC)   Atrial fibrillation with RVR (HCC)   Acute on chronic respiratory failure with hypoxia (HCC)   Acute metabolic encephalopathy   Sepsis (HCC)   Hyperkalemia   Abnormal LFTs   Elevated troponin   COPD exacerbation (HCC)   Sepsis due to pneumonia (HCC)   Diabetes mellitus type 2, uncontrolled, with complications (HCC)   Anxiety   Acute on chronic respiratory failure with hypoxia and hypercapnia (HCC)   Palliative care encounter   End stage COPD (HCC)    Acute respiratory failure with hypercapnia (HCC)  Assessment: Acute metabolic encephalopathy likely secondary to severe hypercapnia Acute on chronic hypoxic hypercarbic respiratory failure secondary to HCAP Severe hypercapnia in the setting of end stage COPD  End-stage COPD Severe metabolic alkalosis secondary to severe hypercapnia Paroxysmal A. fib Type 2 diabetes with hyperglycemia Chronic anxiety/depression Chronic macrocytic anemia Acute thrombocytopenia  Plan: Patient is now comfort measures only.  All means of treatment have been discontinued.  All focus is on the patient's comfort.  Death is imminent.  Consultants:   Palliative Care   Objective: Vitals:   01/11/19 2310 01/12/19 0606 01/12/19 0731 01/12/19 0900  BP:  121/75 110/80   Pulse: 99 95 92 92  Resp:  (!) 31 (!) 26 (!) 25  Temp:      TempSrc:      SpO2:  100% 100% 99%  Weight:      Height:        Intake/Output Summary (Last 24 hours) at 01/12/2019 1100 Last data filed at 01/11/2019 2009 Gross per 24 hour  Intake 120 ml  Output 750 ml  Net -630 ml   Filed Weights   01/09/19 0456 01/10/19 0700 01/11/19 0535  Weight: 60.8 kg 58.8 kg 57.8 kg    Exam:  . General: 73 y.o. year-old male chronically ill-appearing in no acute distress.  . Cardiovascular: Regular rate and rhythm with no rubs or gallops.  No JVD or thyromegaly noted.   Marland Kitchen. Respiratory: Mild rales at bases with no wheezes noted.  Poor inspiratory effort.   . Abdomen: Soft nontender nondistended with normal bowel sounds x4 quadrants.   .Marland Kitchen  Musculoskeletal: No lower extremity edema.  Data Reviewed: CBC: Recent Labs  Lab 01/10/19 0500 01/11/19 0706  WBC 5.8 5.3  HGB 7.7* 7.9*  HCT 27.8* 28.0*  MCV 103.7* 103.7*  PLT 104* 113*   Basic Metabolic Panel: Recent Labs  Lab 01/10/19 0500 01/11/19 0706 01/12/19 0450  NA 143 144 142  K 4.4 4.4 4.4  CL 85* 84* 83*  CO2 >50* >50* >50*  GLUCOSE 108* 124* 122*  BUN 22 19 30*  CREATININE  0.57* 0.56* 0.46*  CALCIUM 9.3 9.8 9.6   GFR: Estimated Creatinine Clearance: 70.2 mL/min (A) (by C-G formula based on SCr of 0.46 mg/dL (L)). Liver Function Tests: Recent Labs  Lab 01/11/19 0706  AST 18  ALT 20  ALKPHOS 35*  BILITOT 0.5  PROT 5.9*  ALBUMIN 2.6*   No results for input(s): LIPASE, AMYLASE in the last 168 hours. No results for input(s): AMMONIA in the last 168 hours. Coagulation Profile: No results for input(s): INR, PROTIME in the last 168 hours. Cardiac Enzymes: No results for input(s): CKTOTAL, CKMB, CKMBINDEX, TROPONINI in the last 168 hours. BNP (last 3 results) No results for input(s): PROBNP in the last 8760 hours. HbA1C: No results for input(s): HGBA1C in the last 72 hours. CBG: Recent Labs  Lab 01/11/19 1135 01/11/19 1610 01/11/19 2129 01/12/19 0045 01/12/19 0803  GLUCAP 280* 245* 93 129* 153*   Lipid Profile: No results for input(s): CHOL, HDL, LDLCALC, TRIG, CHOLHDL, LDLDIRECT in the last 72 hours. Thyroid Function Tests: No results for input(s): TSH, T4TOTAL, FREET4, T3FREE, THYROIDAB in the last 72 hours. Anemia Panel: No results for input(s): VITAMINB12, FOLATE, FERRITIN, TIBC, IRON, RETICCTPCT in the last 72 hours. Urine analysis:    Component Value Date/Time   COLORURINE AMBER (A) 12-29-18 2036   APPEARANCEUR CLOUDY (A) 12-29-2018 2036   LABSPEC 1.020 Dec 29, 2018 2036   PHURINE 5.0 2018/12/29 2036   GLUCOSEU >=500 (A) 12/29/18 2036   HGBUR NEGATIVE 2018/12/29 2036   BILIRUBINUR NEGATIVE 12-29-18 2036   KETONESUR NEGATIVE 29-Dec-2018 2036   PROTEINUR 100 (A) Dec 29, 2018 2036   NITRITE NEGATIVE 2018/12/29 2036   LEUKOCYTESUR NEGATIVE Dec 29, 2018 2036   Sepsis Labs: @LABRCNTIP (procalcitonin:4,lacticidven:4)  ) No results found for this or any previous visit (from the past 240 hour(s)).    Studies: No results found.  Scheduled Meds: . apixaban  5 mg Oral BID  . aspirin EC  81 mg Oral Daily  . atorvastatin  20 mg Oral  q1800  . dextromethorphan-guaiFENesin  1 tablet Oral BID  . famotidine  20 mg Oral Daily  . feeding supplement (PRO-STAT SUGAR FREE 64)  30 mL Oral BID  . fluticasone  2 spray Each Nare Daily  . insulin aspart  0-9 Units Subcutaneous Q4H  . insulin aspart  3 Units Subcutaneous TID WC  . ipratropium-albuterol  3 mL Nebulization TID  . predniSONE  10 mg Oral Q breakfast  . Ensure Max Protein  11 oz Oral Daily  . sertraline  50 mg Oral Daily  . sodium chloride flush  10-40 mL Intracatheter Q12H    Continuous Infusions:   LOS: 17 days     Darlin Drop, MD [facility] Pager [phone]  If 7PM-7AM, please contact night-coverage [email or web address] Password [email or web address] 01/12/2019, 11:00 AM

## 2019-01-12 NOTE — Care Management Important Message (Signed)
Important Message  Patient Details  Name: Gregory Stone MRN: [id number] Date of Birth: Feb 15, 1946   Medicare Important Message Given:  Yes    [name] 01/12/2019, 2:30 PM

## 2019-01-13 MED ORDER — IPRATROPIUM-ALBUTEROL 0.5-2.5 (3) MG/3ML IN SOLN
3.0000 mL | Freq: Two times a day (BID) | RESPIRATORY_TRACT | Status: DC | PRN
  Administered 2019-01-13 – 2019-01-14 (×2): 3 mL via RESPIRATORY_TRACT
  Filled 2019-01-13 (×2): qty 3

## 2019-01-13 NOTE — Progress Notes (Signed)
DuoNeb given for c/o SOB. States Breathing is better. Appears to be more comfortable.

## 2019-01-13 NOTE — Discharge Summary (Signed)
Discharge Summary  Gregory Stone ZOX:[id number] DOB: 1946-04-25  PCP: Patient, No Pcp Per  Admit date: 04/11/19 Discharge date: 01/13/2019  Time spent: 35 minutes  Recommendations for Outpatient Follow-up:  1. Continue with residential hospice care  Discharge Diagnoses:  Active Hospital Problems   Diagnosis Date Noted   HCAP (healthcare-associated pneumonia) 008/12/20   End stage COPD (HCC)    Acute respiratory failure with hypercapnia (HCC)    Acute on chronic respiratory failure with hypoxia and hypercapnia (HCC)    Palliative care encounter    Sepsis due to pneumonia (HCC) 12/27/2018   Diabetes mellitus type 2, uncontrolled, with complications (HCC) 12/27/2018   Anxiety 12/27/2018   COPD exacerbation (HCC) 12/26/2018   Atrial fibrillation with RVR (HCC) 008/12/20   Acute on chronic respiratory failure with hypoxia (HCC) 008/12/20   Acute metabolic encephalopathy 008/12/20   Sepsis (HCC) 008/12/20   Hyperkalemia 008/12/20   Abnormal LFTs 008/12/20   Elevated troponin 008/12/20   COPD (chronic obstructive pulmonary disease) (HCC) 008/12/20   Depression    Diabetes mellitus without complication (HCC)    HLD (hyperlipidemia)     Resolved Hospital Problems  No resolved problems to display.    Discharge Condition: Stable  Diet recommendation: Pleasure feedings  Vitals:   01/12/19 2215 01/13/19 0558  BP: 133/77 127/65  Pulse: 81 83  Resp: 13   Temp: (!) 97.4 F (36.3 C) 97.7 F (36.5 C)  SpO2: 100% 100%    History of present illness:  73 y.o.BM PMHx hyperlipidemia, diabetes mellitus type II uncontrolled with complication, end stage COPD, chronic hypercarbic hypoxic respiratory failure, chronic atrial fibrillation on Eliquis,depression, who presented with altered mental status and shortness of breath. Per report, found to have O2 sats in 50s. He was given breathing treatments and had also been treated with a dose of Ativan then  patient became minimally responsive. In ED he was barely arousable. Negative COVID test, negative flu test. WBC is 12.3, lactic acid 4.9, troponin 0.03, UA suggestive of UTI. A. fib with RVR, O2 sats 100% on 5 L. ABG showed pH of 7.18, PCO2 103, PO2 142. Chest x-ray showed left upper lobe infiltrates. CT head negative. Treated for HCAP. BIPAP dependent with severe hypercarbia with PCO2 110. Palliative care team following. On 01/12/19 decision made for comfort care only. No BIPAP per family.  01/13/19: Patient seen and examined at his bedside.  No acute events overnight.  He is on nasal cannula.  Does not appear in distress.  He has no new complaints.  Patient will continue his care with residential hospice.  Hospital Course:  Principal Problem:   HCAP (healthcare-associated pneumonia) Active Problems:   HLD (hyperlipidemia)   Diabetes mellitus without complication (HCC)   Depression   COPD (chronic obstructive pulmonary disease) (HCC)   Atrial fibrillation with RVR (HCC)   Acute on chronic respiratory failure with hypoxia (HCC)   Acute metabolic encephalopathy   Sepsis (HCC)   Hyperkalemia   Abnormal LFTs   Elevated troponin   COPD exacerbation (HCC)   Sepsis due to pneumonia (HCC)   Diabetes mellitus type 2, uncontrolled, with complications (HCC)   Anxiety   Acute on chronic respiratory failure with hypoxia and hypercapnia (HCC)   Palliative care encounter   End stage COPD (HCC)   Acute respiratory failure with hypercapnia (HCC)  Assessment: Acute metabolic encephalopathy likely secondary to severe hypercapnia Acute on chronic hypoxic hypercarbic respiratory failure secondary to HCAP Severe hypercapnia in the setting of end stage COPD  End-stage  COPD Severe metabolic alkalosis secondary to severe hypercapnia Paroxysmal A. fib Type 2 diabetes with hyperglycemia Chronic anxiety/depression Chronic macrocytic anemia Acute thrombocytopenia  Plan: Patient is now comfort  measures only.  All means of treatment have been discontinued.  All focus is on the patient's comfort.    Patient will continue care at residential hospice as requested by family.  Consultants:  Palliative Care    Discharge Exam: BP 127/65 (BP Location: Right Arm)    Pulse 83    Temp 97.7 F (36.5 C) (Oral)    Resp 13    Ht 5' 8.5" (1.74 m)    Wt 55.5 kg    SpO2 100%    BMI 18.34 kg/m   General: 73 y.o. year-old male chronically ill-appearing in no acute distress.   Cardiovascular: Regular rate and rhythm with no rubs or gallops.  Respiratory: Mild rales at bases with no wheezes noted.  Abdomen: Soft nontender nondistended with normal bowel sounds x4 quadrants.    Musculoskeletal: No lower extremity edema. 2/4 pulses in all 4 extremities.  Psychiatry: Mood is appropriate for condition and setting  Discharge Instructions You were cared for by a hospitalist during your hospital stay. If you have any questions about your discharge medications or the care you received while you were in the hospital after you are discharged, you can call the unit and asked to speak with the hospitalist on call if the hospitalist that took care of you is not available. Once you are discharged, your primary care physician will handle any further medical issues. Please note that NO REFILLS for any discharge medications will be authorized once you are discharged, as it is imperative that you return to your primary care physician (or establish a relationship with a primary care physician if you do not have one) for your aftercare needs so that they can reassess your need for medications and monitor your lab values.   Allergies as of 01/13/2019      Reactions   Lisinopril Swelling   Angioedema   Sulfamethoxazole-trimethoprim Anaphylaxis, Other (See Comments)   Other reaction(s): Muscle Pain   Ticagrelor Shortness Of Breath      Trimethoprim Other (See Comments)   Per MAR      Medication List    STOP  taking these medications   Acidophilus Probiotic 100 MG Caps   atorvastatin 40 MG tablet Commonly known as:  LIPITOR   doxycycline 100 MG tablet Commonly known as:  VIBRA-TABS   Eliquis 5 MG Tabs tablet Generic drug:  apixaban   guaiFENesin 600 MG 12 hr tablet Commonly known as:  MUCINEX   ipratropium-albuterol 0.5-2.5 (3) MG/3ML Soln Commonly known as:  DUONEB   LORazepam 0.5 MG tablet Commonly known as:  ATIVAN   multivitamin with minerals Tabs tablet   pantoprazole 40 MG tablet Commonly known as:  PROTONIX   predniSONE 20 MG tablet Commonly known as:  DELTASONE   ProMod Liqd   sertraline 50 MG tablet Commonly known as:  ZOLOFT   vitamin C 500 MG tablet Commonly known as:  ASCORBIC ACID     TAKE these medications   Basaglar KwikPen 100 UNIT/ML Sopn Inject 0.05 mLs (5 Units total) into the skin at bedtime. What changed:    how much to take  when to take this  additional instructions   insulin aspart 100 UNIT/ML injection Commonly known as:  novoLOG Inject 0-9 Units into the skin 3 (three) times daily with meals. Sliding scale CBG 70 - 120:  0 units CBG 121 - 150: 1 unit,  CBG 151 - 200: 2 units,  CBG 201 - 250: 3 units,  CBG 251 - 300: 5 units,  CBG 301 - 350: 7 units,  CBG 351 - 400: 9 units   CBG > 400: 9 units and notify your MD   OXYGEN Inhale 3 L into the lungs continuous.      Allergies  Allergen Reactions   Lisinopril Swelling    Angioedema    Sulfamethoxazole-Trimethoprim Anaphylaxis and Other (See Comments)    Other reaction(s): Muscle Pain    Ticagrelor Shortness Of Breath        Trimethoprim Other (See Comments)    Per MAR   Contact information for after-discharge care    Destination    [facility] .   Service:  Skilled Nursing Contact information: [address] [place] [address] [phone]               The results of significant diagnostics from this hospitalization  (including imaging, microbiology, ancillary and laboratory) are listed below for reference.    Significant Diagnostic Studies: Ct Head Wo Contrast  Result Date: 12/26/2018 CLINICAL DATA:  73 year old male with ataxia, encephalopathy, atrial fibrillation. EXAM: CT HEAD WITHOUT CONTRAST TECHNIQUE: Contiguous axial images were obtained from the base of the skull through the vertex without intravenous contrast. COMPARISON:  None. FINDINGS: Brain: Cerebral volume is within normal limits for age. No midline shift, ventriculomegaly, mass effect, evidence of mass lesion, intracranial hemorrhage or evidence of cortically based acute infarction. Patchy periatrial white matter hypodensity. No cortical encephalomalacia. Vascular: Calcified atherosclerosis at the skull base. No suspicious intracranial vascular hyperdensity. Skull: Negative. Sinuses/Orbits: Visualized paranasal sinuses and mastoids are well pneumatized. Other: Negative orbits. Visualized scalp soft tissues are within normal limits. IMPRESSION: 1. No acute intracranial abnormality. 2. Mild for age nonspecific cerebral white matter changes, most commonly due to small vessel disease. Electronically Signed   By: Odessa Fleming M.D.   On: 12/26/2018 01:49   Dg Chest Port 1 View  Result Date: 01/05/2019 CLINICAL DATA:  73 year old with dyspnea. EXAM: PORTABLE CHEST 1 VIEW COMPARISON:  12/31/2018 and 12/21/2018 FINDINGS: Residual interstitial densities in the left upper lobe. Persistent densities in the right lower chest compatible with pleural fluid and atelectasis/consolidation. Increased densities at the left lung base compatible with increasing left pleural fluid. There is a left arm PICC line with the catheter tip near the SVC and right atrium junction. Heart size is upper limits of normal but stable. IMPRESSION: 1. Increased densities at the left lung base are compatible with small left pleural effusion with left basilar atelectasis or consolidation. 2.  Persistent right basilar densities compatible with small right pleural effusion with atelectasis or consolidation. 3. Persistent interstitial densities in left upper lung. Concern for underlying pneumonia. Electronically Signed   By: Richarda Overlie M.D.   On: 01/05/2019 12:06   Dg Chest Port 1 View  Result Date: 12/31/2018 CLINICAL DATA:  Dyspnea at rest EXAM: PORTABLE CHEST 1 VIEW COMPARISON:  12/13/2018 chest radiograph. FINDINGS: Stable cardiomediastinal silhouette with normal heart size. No pneumothorax. No left pleural effusion. Chronic blunting of the right costophrenic angle back to 11/26/2018. Hazy upper left lung and right lung base opacity is stable. New left retrocardiac consolidation. IMPRESSION: 1. New left retrocardiac consolidation. Stable hazy upper left lung and right lung base opacity. Findings suggest multilobar pneumonia. 2. Chronic blunting of the right costophrenic angle back to 11/26/2026 radiograph, compatible with small  pleural effusion and/or pleural-parenchymal scarring. Electronically Signed   By: Delbert Phenix M.D.   On: 12/31/2018 15:01   Dg Chest Port 1 View  Result Date: Jan 02, 2019 CLINICAL DATA:  73 year old male with sepsis. EXAM: PORTABLE CHEST 1 VIEW COMPARISON:  11/26/2018 and earlier. FINDINGS: Portable AP supine view at 2057 hours. Stable large lung volumes. Stable cardiac size and mediastinal contours. Visualized tracheal air column is within normal limits. No pneumothorax, pleural effusion or consolidation. New asymmetric reticulonodular opacity in the left upper lung. No acute osseous abnormality identified. IMPRESSION: New left upper lobe reticulonodular pulmonary opacity suspicious for acute viral/atypical respiratory infection. Underlying pulmonary hyperinflation. Electronically Signed   By: Odessa Fleming M.D.   On: 01/02/19 22:02   [place] Ekg Site Rite  Result Date: 01/01/2019 If Site Rite image not attached, placement could not be confirmed due to current cardiac  rhythm.   Microbiology: No results found for this or any previous visit (from the past 240 hour(s)).   Labs: Basic Metabolic Panel: Recent Labs  Lab 01/10/19 0500 01/11/19 0706 01/12/19 0450  NA 143 144 142  K 4.4 4.4 4.4  CL 85* 84* 83*  CO2 >50* >50* >50*  GLUCOSE 108* 124* 122*  BUN 22 19 30*  CREATININE 0.57* 0.56* 0.46*  CALCIUM 9.3 9.8 9.6   Liver Function Tests: Recent Labs  Lab 01/11/19 0706  AST 18  ALT 20  ALKPHOS 35*  BILITOT 0.5  PROT 5.9*  ALBUMIN 2.6*   No results for input(s): LIPASE, AMYLASE in the last 168 hours. No results for input(s): AMMONIA in the last 168 hours. CBC: Recent Labs  Lab 01/10/19 0500 01/11/19 0706  WBC 5.8 5.3  HGB 7.7* 7.9*  HCT 27.8* 28.0*  MCV 103.7* 103.7*  PLT 104* 113*   Cardiac Enzymes: No results for input(s): CKTOTAL, CKMB, CKMBINDEX, TROPONINI in the last 168 hours. BNP: BNP (last 3 results) Recent Labs    12/26/18 0831 01/04/19 1541  BNP 96.7 92.6    ProBNP (last 3 results) No results for input(s): PROBNP in the last 8760 hours.  CBG: Recent Labs  Lab 01/11/19 1610 01/11/19 2129 01/12/19 0045 01/12/19 0803 01/12/19 1141  GLUCAP 245* 93 129* 153* 196*       Signed:  Darlin Drop, MD [facility] 01/13/2019, 12:50 PM

## 2019-01-13 NOTE — Plan of Care (Signed)
  Problem: Skin Integrity: Goal: Risk for impaired skin integrity will decrease Outcome: Progressing   

## 2019-01-13 NOTE — Care Management (Signed)
Per CSW, Marrianne Mood, [facility] did not have a bed today and was still in process of verifying patient eligibility for residential hospice.  [facility] will contact Morrie Sheldon, CSW when bed available and patient accepted.

## 2019-01-13 NOTE — Progress Notes (Signed)
Requests breathing treatment for SOB. Morphine 1 mg IV given for WOB.

## 2019-01-14 MED ORDER — PREDNISONE 20 MG PO TABS: 40.0000 mg | ORAL_TABLET | Freq: Every day | ORAL | Status: DC

## 2019-01-14 MED ORDER — PREDNISONE 20 MG PO TABS
20.0000 mg | ORAL_TABLET | Freq: Every day | ORAL | Status: AC
  Administered 2019-01-16: 08:00:00 20 mg via ORAL
  Filled 2019-01-14: qty 1

## 2019-01-14 MED ORDER — PREDNISONE 20 MG PO TABS
40.0000 mg | ORAL_TABLET | Freq: Once | ORAL | Status: AC
  Administered 2019-01-14: 18:00:00 40 mg via ORAL
  Filled 2019-01-14: qty 2

## 2019-01-14 MED ORDER — IPRATROPIUM-ALBUTEROL 0.5-2.5 (3) MG/3ML IN SOLN: 3.0000 mL | Freq: Three times a day (TID) | RESPIRATORY_TRACT | Status: DC

## 2019-01-14 MED ORDER — MORPHINE SULFATE (PF) 2 MG/ML IV SOLN
2.0000 mg | INTRAVENOUS | Status: DC | PRN
  Administered 2019-01-19: 11:00:00 2 mg via INTRAVENOUS
  Administered 2019-01-19 (×2): 4 mg via INTRAVENOUS
  Administered 2019-01-20: 09:00:00 2 mg via INTRAVENOUS
  Administered 2019-01-20: 03:00:00 4 mg via INTRAVENOUS
  Administered 2019-01-20 (×4): 2 mg via INTRAVENOUS
  Administered 2019-01-20 – 2019-01-21 (×3): 4 mg via INTRAVENOUS
  Administered 2019-01-21 (×2): 2 mg via INTRAVENOUS
  Administered 2019-01-21 (×3): 4 mg via INTRAVENOUS
  Filled 2019-01-14: qty 2
  Filled 2019-01-14 (×2): qty 1
  Filled 2019-01-14 (×3): qty 2
  Filled 2019-01-14: qty 1
  Filled 2019-01-14 (×3): qty 2
  Filled 2019-01-14: qty 1
  Filled 2019-01-14 (×4): qty 2
  Filled 2019-01-14 (×2): qty 1

## 2019-01-14 MED ORDER — IPRATROPIUM-ALBUTEROL 0.5-2.5 (3) MG/3ML IN SOLN
3.0000 mL | RESPIRATORY_TRACT | Status: DC | PRN
  Administered 2019-01-15 – 2019-01-20 (×5): 3 mL via RESPIRATORY_TRACT
  Filled 2019-01-14 (×5): qty 3

## 2019-01-14 MED ORDER — IPRATROPIUM-ALBUTEROL 0.5-2.5 (3) MG/3ML IN SOLN
3.0000 mL | Freq: Three times a day (TID) | RESPIRATORY_TRACT | Status: DC
  Administered 2019-01-14 – 2019-01-17 (×8): 3 mL via RESPIRATORY_TRACT
  Filled 2019-01-14 (×9): qty 3

## 2019-01-14 MED ORDER — PREDNISONE 10 MG PO TABS
10.0000 mg | ORAL_TABLET | Freq: Every day | ORAL | Status: AC
  Administered 2019-01-17: 10 mg via ORAL
  Filled 2019-01-14: qty 1

## 2019-01-14 MED ORDER — PREDNISONE 20 MG PO TABS
30.0000 mg | ORAL_TABLET | Freq: Every day | ORAL | Status: AC
  Administered 2019-01-15: 10:00:00 30 mg via ORAL
  Filled 2019-01-14: qty 1

## 2019-01-14 NOTE — Progress Notes (Addendum)
Discharge Summary  Gregory Stone ZOX:[id number] DOB: 18-Aug-1946  PCP: Patient, No Pcp Per  Admit date: Mar 06, 2019 Discharge date: 01/14/2019  Time spent: 35 minutes  Recommendations for Outpatient Follow-up:  1. High risk for rapid deterioration. Anticipate hospital death.  Discharge Diagnoses:  Active Hospital Problems   Diagnosis Date Noted   HCAP (healthcare-associated pneumonia) 0Jul 07, 2020   End stage COPD (HCC)    Acute respiratory failure with hypercapnia (HCC)    Acute on chronic respiratory failure with hypoxia and hypercapnia (HCC)    Palliative care encounter    Sepsis due to pneumonia (HCC) 12/27/2018   Diabetes mellitus type 2, uncontrolled, with complications (HCC) 12/27/2018   Anxiety 12/27/2018   COPD exacerbation (HCC) 12/26/2018   Atrial fibrillation with RVR (HCC) 0Jul 07, 2020   Acute on chronic respiratory failure with hypoxia (HCC) 0Jul 07, 2020   Acute metabolic encephalopathy 0Jul 07, 2020   Sepsis (HCC) 0Jul 07, 2020   Hyperkalemia 0Jul 07, 2020   Abnormal LFTs 0Jul 07, 2020   Elevated troponin 0Jul 07, 2020   COPD (chronic obstructive pulmonary disease) (HCC) 0Jul 07, 2020   Depression    Diabetes mellitus without complication (HCC)    HLD (hyperlipidemia)     Resolved Hospital Problems  No resolved problems to display.    Discharge Condition: Stable  Diet recommendation: Pleasure feedings  Vitals:   01/14/19 0434 01/14/19 1327  BP: 118/75 107/74  Pulse: 90 91  Resp: 19   Temp: 98.4 F (36.9 C) 97.6 F (36.4 C)  SpO2: 100% 100%    History of present illness:  73 y.o.BM PMHx hyperlipidemia, diabetes mellitus type II uncontrolled with complication, end stage COPD, chronic hypercarbic hypoxic respiratory failure, chronic atrial fibrillation on Eliquis,depression, who presented with altered mental status and shortness of breath. Per report, found to have O2 sats in 50s. He was given breathing treatments and had also been treated with a  dose of Ativan then patient became minimally responsive. In ED he was barely arousable. Negative COVID test, negative flu test. WBC is 12.3, lactic acid 4.9, troponin 0.03, UA suggestive of UTI. A. fib with RVR, O2 sats 100% on 5 L. ABG showed pH of 7.18, PCO2 103, PO2 142. Chest x-ray showed left upper lobe infiltrates. CT head negative. Treated for HCAP. BIPAP dependent with severe hypercarbia with PCO2 110. Palliative care team following. On 01/12/19 decision made for comfort care only. No BIPAP per family.  01/14/19: Patient seen and examined at his bedside this morning.  He is lying comfortably in bed nasal cannula in place.  He requested to sit in a chair.  Chair alarm in place.    Increase work of breathing noted this afternoon by bedside RN. Suspect worsening hypercapnia in the setting of recent arterial ABG revealing PCO2 110. Death is imminent.   Family will be allowed visitation per hospital protocol.   Hospital Course:  Principal Problem:   HCAP (healthcare-associated pneumonia) Active Problems:   HLD (hyperlipidemia)   Diabetes mellitus without complication (HCC)   Depression   COPD (chronic obstructive pulmonary disease) (HCC)   Atrial fibrillation with RVR (HCC)   Acute on chronic respiratory failure with hypoxia (HCC)   Acute metabolic encephalopathy   Sepsis (HCC)   Hyperkalemia   Abnormal LFTs   Elevated troponin   COPD exacerbation (HCC)   Sepsis due to pneumonia (HCC)   Diabetes mellitus type 2, uncontrolled, with complications (HCC)   Anxiety   Acute on chronic respiratory failure with hypoxia and hypercapnia (HCC)   Palliative care encounter   End stage COPD (HCC)   Acute  respiratory failure with hypercapnia (HCC)  Assessment: Acute metabolic encephalopathy likely secondary to severe hypercapnia Acute on chronic hypoxic hypercarbic respiratory failure secondary to HCAP Severe hypercapnia in the setting of end stage COPD  End-stage COPD Severe  metabolic alkalosis secondary to severe hypercapnia Paroxysmal A. fib Type 2 diabetes with hyperglycemia Chronic anxiety/depression Chronic macrocytic anemia Acute thrombocytopenia  Plan: Patient is now comfort measures only.  All means of treatment have been discontinued.  All focus is on the patient's comfort.       Death is imminent.  Consultants:  Palliative Care    Discharge Exam: BP 107/74 (BP Location: Right Arm)    Pulse 91    Temp 97.6 F (36.4 C) (Oral)    Resp 19    Ht 5' 8.5" (1.74 m)    Wt 55.5 kg    SpO2 100%    BMI 18.33 kg/m   General: 73 y.o. year-old male chronically ill-appearing in no acute distress.  Cardiovascular: Regular rate and rhythm with no rubs or gallops.  Respiratory: Bibasilar Rales with no wheezes noted.  Poor inspiratory effort.  Abdomen: Soft nontender nondistended with normal bowel sounds x4 quadrants.    Musculoskeletal: No lower extremity edema.  2 out of 4 pulses in all 4 extremities.  Psychiatry: Mood is appropriate condition and setting.  Discharge Instructions You were cared for by a hospitalist during your hospital stay. If you have any questions about your discharge medications or the care you received while you were in the hospital after you are discharged, you can call the unit and asked to speak with the hospitalist on call if the hospitalist that took care of you is not available. Once you are discharged, your primary care physician will handle any further medical issues. Please note that NO REFILLS for any discharge medications will be authorized once you are discharged, as it is imperative that you return to your primary care physician (or establish a relationship with a primary care physician if you do not have one) for your aftercare needs so that they can reassess your need for medications and monitor your lab values.   Allergies as of 01/14/2019      Reactions   Lisinopril Swelling   Angioedema    Sulfamethoxazole-trimethoprim Anaphylaxis, Other (See Comments)   Other reaction(s): Muscle Pain   Ticagrelor Shortness Of Breath      Trimethoprim Other (See Comments)   Per MAR      Medication List    STOP taking these medications   Acidophilus Probiotic 100 MG Caps   atorvastatin 40 MG tablet Commonly known as:  LIPITOR   doxycycline 100 MG tablet Commonly known as:  VIBRA-TABS   Eliquis 5 MG Tabs tablet Generic drug:  apixaban   guaiFENesin 600 MG 12 hr tablet Commonly known as:  MUCINEX   ipratropium-albuterol 0.5-2.5 (3) MG/3ML Soln Commonly known as:  DUONEB   LORazepam 0.5 MG tablet Commonly known as:  ATIVAN   multivitamin with minerals Tabs tablet   pantoprazole 40 MG tablet Commonly known as:  PROTONIX   predniSONE 20 MG tablet Commonly known as:  DELTASONE   ProMod Liqd   sertraline 50 MG tablet Commonly known as:  ZOLOFT   vitamin C 500 MG tablet Commonly known as:  ASCORBIC ACID     TAKE these medications   Basaglar KwikPen 100 UNIT/ML Sopn Inject 0.05 mLs (5 Units total) into the skin at bedtime. What changed:    how much to take  when to  take this  additional instructions   insulin aspart 100 UNIT/ML injection Commonly known as:  novoLOG Inject 0-9 Units into the skin 3 (three) times daily with meals. Sliding scale CBG 70 - 120: 0 units CBG 121 - 150: 1 unit,  CBG 151 - 200: 2 units,  CBG 201 - 250: 3 units,  CBG 251 - 300: 5 units,  CBG 301 - 350: 7 units,  CBG 351 - 400: 9 units   CBG > 400: 9 units and notify your MD   OXYGEN Inhale 3 L into the lungs continuous.      Allergies  Allergen Reactions   Lisinopril Swelling    Angioedema    Sulfamethoxazole-Trimethoprim Anaphylaxis and Other (See Comments)    Other reaction(s): Muscle Pain    Ticagrelor Shortness Of Breath        Trimethoprim Other (See Comments)    Per MAR   Contact information for after-discharge care    Destination    [facility]  SNF .   Service:  Skilled Nursing Contact information: [address] [place] [address] [phone]               The results of significant diagnostics from this hospitalization (including imaging, microbiology, ancillary and laboratory) are listed below for reference.    Significant Diagnostic Studies: Ct Head Wo Contrast  Result Date: 12/26/2018 CLINICAL DATA:  73 year old male with ataxia, encephalopathy, atrial fibrillation. EXAM: CT HEAD WITHOUT CONTRAST TECHNIQUE: Contiguous axial images were obtained from the base of the skull through the vertex without intravenous contrast. COMPARISON:  None. FINDINGS: Brain: Cerebral volume is within normal limits for age. No midline shift, ventriculomegaly, mass effect, evidence of mass lesion, intracranial hemorrhage or evidence of cortically based acute infarction. Patchy periatrial white matter hypodensity. No cortical encephalomalacia. Vascular: Calcified atherosclerosis at the skull base. No suspicious intracranial vascular hyperdensity. Skull: Negative. Sinuses/Orbits: Visualized paranasal sinuses and mastoids are well pneumatized. Other: Negative orbits. Visualized scalp soft tissues are within normal limits. IMPRESSION: 1. No acute intracranial abnormality. 2. Mild for age nonspecific cerebral white matter changes, most commonly due to small vessel disease. Electronically Signed   By: Odessa Fleming M.D.   On: 12/26/2018 01:49   Dg Chest Port 1 View  Result Date: 01/05/2019 CLINICAL DATA:  73 year old with dyspnea. EXAM: PORTABLE CHEST 1 VIEW COMPARISON:  12/31/2018 and 30-Dec-2018 FINDINGS: Residual interstitial densities in the left upper lobe. Persistent densities in the right lower chest compatible with pleural fluid and atelectasis/consolidation. Increased densities at the left lung base compatible with increasing left pleural fluid. There is a left arm PICC line with the catheter tip near the SVC and right atrium junction.  Heart size is upper limits of normal but stable. IMPRESSION: 1. Increased densities at the left lung base are compatible with small left pleural effusion with left basilar atelectasis or consolidation. 2. Persistent right basilar densities compatible with small right pleural effusion with atelectasis or consolidation. 3. Persistent interstitial densities in left upper lung. Concern for underlying pneumonia. Electronically Signed   By: Richarda Overlie M.D.   On: 01/05/2019 12:06   Dg Chest Port 1 View  Result Date: 12/31/2018 CLINICAL DATA:  Dyspnea at rest EXAM: PORTABLE CHEST 1 VIEW COMPARISON:  12-30-18 chest radiograph. FINDINGS: Stable cardiomediastinal silhouette with normal heart size. No pneumothorax. No left pleural effusion. Chronic blunting of the right costophrenic angle back to 11/26/2018. Hazy upper left lung and right lung base opacity is stable. New left retrocardiac consolidation.  IMPRESSION: 1. New left retrocardiac consolidation. Stable hazy upper left lung and right lung base opacity. Findings suggest multilobar pneumonia. 2. Chronic blunting of the right costophrenic angle back to 11/26/2026 radiograph, compatible with small pleural effusion and/or pleural-parenchymal scarring. Electronically Signed   By: Delbert Phenix M.D.   On: 12/31/2018 15:01   Dg Chest Port 1 View  Result Date: 12/17/2018 CLINICAL DATA:  73 year old male with sepsis. EXAM: PORTABLE CHEST 1 VIEW COMPARISON:  11/26/2018 and earlier. FINDINGS: Portable AP supine view at 2057 hours. Stable large lung volumes. Stable cardiac size and mediastinal contours. Visualized tracheal air column is within normal limits. No pneumothorax, pleural effusion or consolidation. New asymmetric reticulonodular opacity in the left upper lung. No acute osseous abnormality identified. IMPRESSION: New left upper lobe reticulonodular pulmonary opacity suspicious for acute viral/atypical respiratory infection. Underlying pulmonary hyperinflation.  Electronically Signed   By: Odessa Fleming M.D.   On: 12/21/2018 22:02   [place] Ekg Site Rite  Result Date: 01/01/2019 If Site Rite image not attached, placement could not be confirmed due to current cardiac rhythm.   Microbiology: No results found for this or any previous visit (from the past 240 hour(s)).   Labs: Basic Metabolic Panel: Recent Labs  Lab 01/10/19 0500 01/11/19 0706 01/12/19 0450  NA 143 144 142  K 4.4 4.4 4.4  CL 85* 84* 83*  CO2 >50* >50* >50*  GLUCOSE 108* 124* 122*  BUN 22 19 30*  CREATININE 0.57* 0.56* 0.46*  CALCIUM 9.3 9.8 9.6   Liver Function Tests: Recent Labs  Lab 01/11/19 0706  AST 18  ALT 20  ALKPHOS 35*  BILITOT 0.5  PROT 5.9*  ALBUMIN 2.6*   No results for input(s): LIPASE, AMYLASE in the last 168 hours. No results for input(s): AMMONIA in the last 168 hours. CBC: Recent Labs  Lab 01/10/19 0500 01/11/19 0706  WBC 5.8 5.3  HGB 7.7* 7.9*  HCT 27.8* 28.0*  MCV 103.7* 103.7*  PLT 104* 113*   Cardiac Enzymes: No results for input(s): CKTOTAL, CKMB, CKMBINDEX, TROPONINI in the last 168 hours. BNP: BNP (last 3 results) Recent Labs    12/26/18 0831 01/04/19 1541  BNP 96.7 92.6    ProBNP (last 3 results) No results for input(s): PROBNP in the last 8760 hours.  CBG: Recent Labs  Lab 01/11/19 1610 01/11/19 2129 01/12/19 0045 01/12/19 0803 01/12/19 1141  GLUCAP 245* 93 129* 153* 196*       Signed:  Darlin Drop, MD [facility] 01/14/2019, 1:50 PM

## 2019-01-14 NOTE — Progress Notes (Signed)
Sitting up in bed. Increased WOB using accessory muscles. States he wants to put his clothes on. Says he will feel better with his clothes. No pt belongings in room except for cell phone and charger. Agrees to keep hospital gown on. Refuses morphine. Agrees to have ativan. O2 @ 3 L [place].

## 2019-01-14 NOTE — TOC Progression Note (Signed)
Transition of Care [facility]) - Progression Note    Patient Details  Name: Gregory Stone MRN: [id number] Date of Birth: 01/03/1946  Transition [facility]) CM/SW Contact  Althea Charon, LCSW Phone Number: 01/14/2019, 10:10 AM  Clinical Narrative:    CSW spoke with representative at [facility]. Hospice facility is aware that pt  is medically stable for discharge. Representative stated that a return call should occur today with status of referral.   Expected Discharge Plan: Skilled Nursing Facility Barriers to Discharge: No Barriers Identified  Expected Discharge Plan and Services Expected Discharge Plan: Skilled Nursing Facility In-house Referral: Clinical Social Work Discharge Planning Services: NA Post Acute Care Choice: Skilled Nursing Facility Living arrangements for the past 2 months: Skilled Nursing Facility Expected Discharge Date: 01/13/19               DME Arranged: N/A DME Agency: NA       HH Arranged: NA HH Agency: NA         Social Determinants of Health (SDOH) Interventions    Readmission Risk Interventions Readmission Risk Prevention Plan 12/29/2018  Transportation Screening Complete  PCP or Specialist Appt within 5-7 Days Complete  Home Care Screening Complete  Medication Review (RN CM) Complete

## 2019-01-14 NOTE — Progress Notes (Signed)
The following 4 family members have permission to visit at bedside 1.  Weyman Rodney 2.  Janine Ores 3.  Fayrene Fearing 4.  Etheleen Sia.    If Etheleen Sia can not come then [employer] will come in her place.  Family has been instructed to stay in the patient's room. Not to be in hallways or common areas.  Family anticipates arrival at 6:00 pm soon after.  They are coming from [facility].  Norvel Richards, PA-C Palliative Medicine Pager: [phone]

## 2019-01-14 NOTE — Progress Notes (Signed)
Requests Nebulizer treatment. Duo neb given for comfort. Refuses morphine at this time. Requests to wait until after breakfast.

## 2019-01-14 NOTE — Progress Notes (Signed)
Up to [facility] with mod to max assist x 2. C/O dizziness. Increased O2 to 7 liters. Checked sat = 100%. O2 decreased to 4 l until pt back to bed. Sats remained mid to upper 90's. Returned to 3 liters once back to bed. Dizziness resolved.

## 2019-01-14 NOTE — Progress Notes (Addendum)
Reviewed chart.  Note change to comfort measures only status.  Examined patient at bedside.  He is more lethargic this afternoon.  Not speaking intelligibly or wanting to eat.  This is a change for him.  Per RN patient has been requesting nebulizers more frequently than ordered.     PE: Awake but lethargic.   Resp:  No increased WOB, but bilateral rals noted. Abd. Thin, nd, nt   Prognosis: I'm very concerned that he could pass within Hours.   At a maximum hours to days.    Recommendations:   For comfort purposes only will add back TID nebs and nebs q 2 hours prn.  Will also add back low dose prednisone.  Patient should transfer to Hospice in [place] soon (today?).    I'm very concerned that he will deteriorate rapidly and die.  I will request that his family come visit today.   I will update his daughter Danella Sensing.  Norvel Richards, PA-C Palliative Medicine Pager: [phone]  Time 25 min.

## 2019-01-15 NOTE — Progress Notes (Signed)
Nutrition Brief Note  Chart reviewed. Pt has transitioned to comfort care.  No further nutrition interventions planned at this time.  Please re-consult as needed.   Kendell Bane RD, LDN, CNSC [phone] Pager [phone] After Hours Pager

## 2019-01-15 NOTE — Progress Notes (Deleted)
Spoke with Dr Okey Dupre and addressed patient's rhythm, VS and symptoms of dizziness and nausea along with periods of tingling in his feet. He doesn't suggest treating the B/P at this time but is suggesting Ativan 1mg  IV but to call [facility] for their suggestions, awaiting call back.

## 2019-01-15 NOTE — Progress Notes (Signed)
Discharge Summary  Julio Zappia WUJ:[id number] DOB: 09-15-1945  PCP: Patient, No Pcp Per  Admit date: 12/24/2018 Discharge date: 01/15/2019  Time spent: 35 minutes  Recommendations for Outpatient Follow-up:  1. High risk for rapid deterioration. Anticipate hospital death.  Discharge Diagnoses:  Active Hospital Problems   Diagnosis Date Noted   HCAP (healthcare-associated pneumonia) 12/23/2018   End stage COPD (HCC)    Acute respiratory failure with hypercapnia (HCC)    Acute on chronic respiratory failure with hypoxia and hypercapnia (HCC)    Palliative care encounter    Sepsis due to pneumonia (HCC) 12/27/2018   Diabetes mellitus type 2, uncontrolled, with complications (HCC) 12/27/2018   Anxiety 12/27/2018   COPD exacerbation (HCC) 12/26/2018   Atrial fibrillation with RVR (HCC) 12/24/2018   Acute on chronic respiratory failure with hypoxia (HCC) 12/21/2018   Acute metabolic encephalopathy 12/06/2018   Sepsis (HCC) 12/23/2018   Hyperkalemia 12/09/2018   Abnormal LFTs 12/17/2018   Elevated troponin 12/09/2018   COPD (chronic obstructive pulmonary disease) (HCC) 12/28/2018   Depression    Diabetes mellitus without complication (HCC)    HLD (hyperlipidemia)     Resolved Hospital Problems  No resolved problems to display.    Discharge Condition: Stable  Diet recommendation: Pleasure feedings.  Vitals:   01/15/19 0725 01/15/19 1423  BP:    Pulse:    Resp:    Temp:    SpO2: 98% 94%    History of present illness:  73 y.o.BM PMHx hyperlipidemia, diabetes mellitus type II uncontrolled with complication, end stage COPD, chronic hypercarbic hypoxic respiratory failure, chronic atrial fibrillation on Eliquis,depression, who presented with altered mental status and shortness of breath. Per report, found to have O2 sats in 50s. He was given breathing treatments and had also been treated with a dose of Ativan then patient became minimally  responsive. In ED he was barely arousable. Negative COVID test, negative flu test. WBC is 12.3, lactic acid 4.9, troponin 0.03, UA suggestive of UTI. A. fib with RVR, O2 sats 100% on 5 L. ABG showed pH of 7.18, PCO2 103, PO2 142. Chest x-ray showed left upper lobe infiltrates. CT head negative. Treated for HCAP. BIPAP dependent with severe hypercarbia with PCO2 110. Palliative care team following. On 01/12/19 decision made for comfort care only. No BIPAP per family.  01/15/19: Patient seen and examined at his bedside.  He is resting comfortably in no acute distress.  Increase work of breathing noted this afternoon by bedside RN. Suspect worsening hypercapnia in the setting of recent arterial ABG revealing PCO2 110. Death is imminent.   Family will be allowed visitation per hospital protocol.   Hospital Course:  Principal Problem:   HCAP (healthcare-associated pneumonia) Active Problems:   HLD (hyperlipidemia)   Diabetes mellitus without complication (HCC)   Depression   COPD (chronic obstructive pulmonary disease) (HCC)   Atrial fibrillation with RVR (HCC)   Acute on chronic respiratory failure with hypoxia (HCC)   Acute metabolic encephalopathy   Sepsis (HCC)   Hyperkalemia   Abnormal LFTs   Elevated troponin   COPD exacerbation (HCC)   Sepsis due to pneumonia (HCC)   Diabetes mellitus type 2, uncontrolled, with complications (HCC)   Anxiety   Acute on chronic respiratory failure with hypoxia and hypercapnia (HCC)   Palliative care encounter   End stage COPD (HCC)   Acute respiratory failure with hypercapnia (HCC)  Assessment: Acute metabolic encephalopathy likely secondary to severe hypercapnia Acute on chronic hypoxic hypercarbic respiratory failure secondary to HCAP  Severe hypercapnia in the setting of end stage COPD  End-stage COPD Severe metabolic alkalosis secondary to severe hypercapnia Paroxysmal A. fib Type 2 diabetes with hyperglycemia Chronic  anxiety/depression Chronic macrocytic anemia Acute thrombocytopenia  Plan: Patient is now comfort measures only.  All means of treatment have been discontinued.  All focus is on the patient's comfort.       Death is imminent.  Consultants:  Palliative Care    Discharge Exam: BP 112/79    Pulse 80    Temp 97.6 F (36.4 C) (Oral)    Resp 19    Ht 5' 8.5" (1.74 m)    Wt 55.5 kg    SpO2 94%    BMI 18.33 kg/m   General: 73 y.o. year-old male chronically ill-appearing in no acute distress.  Respiratory: Bibasilar rales with no wheezes noted.  Poor inspiratory effort.  Abdomen: Soft nontender nondistended with hypoactive bowel sounds.  Musculoskeletal: No lower extremity edema.    Discharge Instructions You were cared for by a hospitalist during your hospital stay. If you have any questions about your discharge medications or the care you received while you were in the hospital after you are discharged, you can call the unit and asked to speak with the hospitalist on call if the hospitalist that took care of you is not available. Once you are discharged, your primary care physician will handle any further medical issues. Please note that NO REFILLS for any discharge medications will be authorized once you are discharged, as it is imperative that you return to your primary care physician (or establish a relationship with a primary care physician if you do not have one) for your aftercare needs so that they can reassess your need for medications and monitor your lab values.   Allergies as of 01/15/2019      Reactions   Lisinopril Swelling   Angioedema   Sulfamethoxazole-trimethoprim Anaphylaxis, Other (See Comments)   Other reaction(s): Muscle Pain   Ticagrelor Shortness Of Breath      Trimethoprim Other (See Comments)   Per MAR      Medication List    STOP taking these medications   Acidophilus Probiotic 100 MG Caps   atorvastatin 40 MG tablet Commonly known as:  LIPITOR    doxycycline 100 MG tablet Commonly known as:  VIBRA-TABS   Eliquis 5 MG Tabs tablet Generic drug:  apixaban   guaiFENesin 600 MG 12 hr tablet Commonly known as:  MUCINEX   ipratropium-albuterol 0.5-2.5 (3) MG/3ML Soln Commonly known as:  DUONEB   LORazepam 0.5 MG tablet Commonly known as:  ATIVAN   multivitamin with minerals Tabs tablet   pantoprazole 40 MG tablet Commonly known as:  PROTONIX   predniSONE 20 MG tablet Commonly known as:  DELTASONE   ProMod Liqd   sertraline 50 MG tablet Commonly known as:  ZOLOFT   vitamin C 500 MG tablet Commonly known as:  ASCORBIC ACID     TAKE these medications   Basaglar KwikPen 100 UNIT/ML Sopn Inject 0.05 mLs (5 Units total) into the skin at bedtime. What changed:    how much to take  when to take this  additional instructions   insulin aspart 100 UNIT/ML injection Commonly known as:  novoLOG Inject 0-9 Units into the skin 3 (three) times daily with meals. Sliding scale CBG 70 - 120: 0 units CBG 121 - 150: 1 unit,  CBG 151 - 200: 2 units,  CBG 201 - 250: 3 units,  CBG 251 -  300: 5 units,  CBG 301 - 350: 7 units,  CBG 351 - 400: 9 units   CBG > 400: 9 units and notify your MD   OXYGEN Inhale 3 L into the lungs continuous.      Allergies  Allergen Reactions   Lisinopril Swelling    Angioedema    Sulfamethoxazole-Trimethoprim Anaphylaxis and Other (See Comments)    Other reaction(s): Muscle Pain    Ticagrelor Shortness Of Breath        Trimethoprim Other (See Comments)    Per MAR   Contact information for after-discharge care    Destination    [facility] .   Service:  Skilled Nursing Contact information: [address] [place] [address] [phone]               The results of significant diagnostics from this hospitalization (including imaging, microbiology, ancillary and laboratory) are listed below for reference.    Significant Diagnostic  Studies: Ct Head Wo Contrast  Result Date: 12/26/2018 CLINICAL DATA:  73 year old male with ataxia, encephalopathy, atrial fibrillation. EXAM: CT HEAD WITHOUT CONTRAST TECHNIQUE: Contiguous axial images were obtained from the base of the skull through the vertex without intravenous contrast. COMPARISON:  None. FINDINGS: Brain: Cerebral volume is within normal limits for age. No midline shift, ventriculomegaly, mass effect, evidence of mass lesion, intracranial hemorrhage or evidence of cortically based acute infarction. Patchy periatrial white matter hypodensity. No cortical encephalomalacia. Vascular: Calcified atherosclerosis at the skull base. No suspicious intracranial vascular hyperdensity. Skull: Negative. Sinuses/Orbits: Visualized paranasal sinuses and mastoids are well pneumatized. Other: Negative orbits. Visualized scalp soft tissues are within normal limits. IMPRESSION: 1. No acute intracranial abnormality. 2. Mild for age nonspecific cerebral white matter changes, most commonly due to small vessel disease. Electronically Signed   By: Odessa Fleming M.D.   On: 12/26/2018 01:49   Dg Chest Port 1 View  Result Date: 01/05/2019 CLINICAL DATA:  73 year old with dyspnea. EXAM: PORTABLE CHEST 1 VIEW COMPARISON:  12/31/2018 and 12/10/2018 FINDINGS: Residual interstitial densities in the left upper lobe. Persistent densities in the right lower chest compatible with pleural fluid and atelectasis/consolidation. Increased densities at the left lung base compatible with increasing left pleural fluid. There is a left arm PICC line with the catheter tip near the SVC and right atrium junction. Heart size is upper limits of normal but stable. IMPRESSION: 1. Increased densities at the left lung base are compatible with small left pleural effusion with left basilar atelectasis or consolidation. 2. Persistent right basilar densities compatible with small right pleural effusion with atelectasis or consolidation. 3. Persistent  interstitial densities in left upper lung. Concern for underlying pneumonia. Electronically Signed   By: Richarda Overlie M.D.   On: 01/05/2019 12:06   Dg Chest Port 1 View  Result Date: 12/31/2018 CLINICAL DATA:  Dyspnea at rest EXAM: PORTABLE CHEST 1 VIEW COMPARISON:  12/26/2018 chest radiograph. FINDINGS: Stable cardiomediastinal silhouette with normal heart size. No pneumothorax. No left pleural effusion. Chronic blunting of the right costophrenic angle back to 11/26/2018. Hazy upper left lung and right lung base opacity is stable. New left retrocardiac consolidation. IMPRESSION: 1. New left retrocardiac consolidation. Stable hazy upper left lung and right lung base opacity. Findings suggest multilobar pneumonia. 2. Chronic blunting of the right costophrenic angle back to 11/26/2026 radiograph, compatible with small pleural effusion and/or pleural-parenchymal scarring. Electronically Signed   By: Delbert Phenix M.D.   On: 12/31/2018 15:01   Dg Chest [facility] 1 [address]  Result Date: 30-Dec-2018 CLINICAL DATA:  73 year old male with sepsis. EXAM: PORTABLE CHEST 1 VIEW COMPARISON:  11/26/2018 and earlier. FINDINGS: Portable AP supine view at 2057 hours. Stable large lung volumes. Stable cardiac size and mediastinal contours. Visualized tracheal air column is within normal limits. No pneumothorax, pleural effusion or consolidation. New asymmetric reticulonodular opacity in the left upper lung. No acute osseous abnormality identified. IMPRESSION: New left upper lobe reticulonodular pulmonary opacity suspicious for acute viral/atypical respiratory infection. Underlying pulmonary hyperinflation. Electronically Signed   By: Odessa Fleming M.D.   On: 12/30/2018 22:02   [place] Ekg Site Rite  Result Date: 01/01/2019 If Site Rite image not attached, placement could not be confirmed due to current cardiac rhythm.   Microbiology: No results found for this or any previous visit (from the past 240 hour(s)).   Labs: Basic Metabolic  Panel: Recent Labs  Lab 01/10/19 0500 01/11/19 0706 01/12/19 0450  NA 143 144 142  K 4.4 4.4 4.4  CL 85* 84* 83*  CO2 >50* >50* >50*  GLUCOSE 108* 124* 122*  BUN 22 19 30*  CREATININE 0.57* 0.56* 0.46*  CALCIUM 9.3 9.8 9.6   Liver Function Tests: Recent Labs  Lab 01/11/19 0706  AST 18  ALT 20  ALKPHOS 35*  BILITOT 0.5  PROT 5.9*  ALBUMIN 2.6*   No results for input(s): LIPASE, AMYLASE in the last 168 hours. No results for input(s): AMMONIA in the last 168 hours. CBC: Recent Labs  Lab 01/10/19 0500 01/11/19 0706  WBC 5.8 5.3  HGB 7.7* 7.9*  HCT 27.8* 28.0*  MCV 103.7* 103.7*  PLT 104* 113*   Cardiac Enzymes: No results for input(s): CKTOTAL, CKMB, CKMBINDEX, TROPONINI in the last 168 hours. BNP: BNP (last 3 results) Recent Labs    12/26/18 0831 01/04/19 1541  BNP 96.7 92.6    ProBNP (last 3 results) No results for input(s): PROBNP in the last 8760 hours.  CBG: Recent Labs  Lab 01/11/19 1610 01/11/19 2129 01/12/19 0045 01/12/19 0803 01/12/19 1141  GLUCAP 245* 93 129* 153* 196*       Signed:  Darlin Drop, MD [facility] 01/15/2019, 2:26 PM

## 2019-01-16 NOTE — Progress Notes (Signed)
Daily Progress Note   Patient Name: Janine OresJoshua Fuller       Date: 01/16/2019 DOB: June 24, 1946  Age: 73 y.o. MRN#: [id number] Attending Physician: Darlin DropHall, Carole N, DO Primary Care Physician: Patient, No Pcp Per Admit Date: 2018/11/28  Reason for Consultation/Follow-up: Non pain symptom management, Pain control, Psychosocial/spiritual support and Terminal Care  Subjective: I ask Mr. Malek if there is anything I can do for him.  "Make my breathing better".  Mr. Lois Huxleyeterkin is a very quiet gentleman who will sit and suffer quietly rather than ask for anything.  I push him for anything I can do for him.  Finally he requests some fresh scrambled eggs.  After I got these for him - He smiled and perked up.  He remains dyspneic at rest and cachectically thin.  Assessment: End stage COPD.  Respiratory status waxes and wanes. He becomes significantly hypercarbic No further options per pulmonary.   Patient Profile/HPI:  73 y.o. male  with past medical history of COPD on 3L at home, Afib on eliquis, DM, and depression who was admitted on 2018/11/28 with AMS and SOB.  Reportedly oxygen sats were 50% at his facility prior to admission.   Mr. Lois Huxleyeterkin was admitted and treated for COPD exacerbation.   Unfortunately he has improved almost to discharge and then relapsed into hypercarbic respiratory failure twice.  Pulmonary has consulted and recommended Palliative care measures.     Length of Stay: 21  Current Medications: Scheduled Meds:  . ipratropium-albuterol  3 mL Nebulization TID  . [START ON 01/17/2019] predniSONE  10 mg Oral Q breakfast    Continuous Infusions:   PRN Meds: antiseptic oral rinse, glycopyrrolate, ipratropium-albuterol, LORazepam, morphine injection, morphine CONCENTRATE, ondansetron  **OR** ondansetron (ZOFRAN) IV, polyvinyl alcohol  Physical Exam        Quiet, thin, deconditioned male, awake, pleasant CV rrr resp SOB with speaking, tachypneic Abdomen thin.  Vital Signs: BP 131/69 (BP Location: Right Arm)   Pulse 82   Temp 98.8 F (37.1 C) (Oral)   Resp 19   Ht 5' 8.5" (1.74 m)   Wt 55.5 kg   SpO2 96%   BMI 18.33 kg/m  SpO2: SpO2: 96 % O2 Device: O2 Device: Nasal Cannula O2 Flow Rate: O2 Flow Rate (L/min): 3 L/min  Intake/output summary: No intake or output data in  the 24 hours ending 01/16/19 1340 LBM: Last BM Date: 01/14/19 Baseline Weight: Weight: 61.7 kg Most recent weight: Weight: 55.5 kg       Palliative Assessment/Data:  30%    Flowsheet Rows     Most Recent Value  Intake Tab  Referral Department  Hospitalist  Unit at Time of Referral  Med/Surg Unit  Date Notified  01/05/19  Palliative Care Type  New Palliative care  Reason for referral  Clarify Goals of Care  Date of Admission  01-01-2019  Date first seen by Palliative Care  01/06/19  # of days Palliative referral response time  1 Day(s)  # of days IP prior to Palliative referral  11  Clinical Assessment  Psychosocial & Spiritual Assessment  Palliative Care Outcomes      Patient Active Problem List   Diagnosis Date Noted  . End stage COPD (HCC)   . Acute respiratory failure with hypercapnia (HCC)   . Acute on chronic respiratory failure with hypoxia and hypercapnia (HCC)   . Palliative care encounter   . Sepsis due to pneumonia (HCC) 12/27/2018  . Diabetes mellitus type 2, uncontrolled, with complications (HCC) 12/27/2018  . Anxiety 12/27/2018  . COPD exacerbation (HCC) 12/26/2018  . Pressure injury of skin 12/26/2018  . COPD (chronic obstructive pulmonary disease) (HCC) 2019-01-01  . Atrial fibrillation with RVR (HCC) 01-Jan-2019  . Acute on chronic respiratory failure with hypoxia (HCC) 01-01-19  . Acute metabolic encephalopathy 01/01/2019  . Sepsis (HCC) 2019/01/01  .  Hyperkalemia 01-01-2019  . Abnormal LFTs 2019/01/01  . Elevated troponin Jan 01, 2019  . HCAP (healthcare-associated pneumonia) 2019/01/01  . HLD (hyperlipidemia)   . Diabetes mellitus without complication (HCC)   . Depression     Palliative Care Plan    Recommendations/Plan:  Patient is full comfort care.   Awaiting hospice placement in [facility] area.  Goals of Care and Additional Recommendations:  Limitations on Scope of Treatment: Full Comfort Care  Code Status:  DNR  Prognosis:   Hours - Days - patient is at high risk of death at any time due to hypercapnia.  Discharge Planning:  Hospice facility  Care plan was discussed with Daughter Danella Sensing, Dr. Margo Aye.  Thank you for allowing the Palliative Medicine Team to assist in the care of this patient.  Total time spent:  35 min.     Greater than 50%  of this time was spent counseling and coordinating care related to the above assessment and plan.  Norvel Richards, PA-C Palliative Medicine  Please contact Palliative MedicineTeam phone at [phone] for questions and concerns between 7 am - 7 pm.   Please see AMION for individual provider pager numbers.

## 2019-01-16 NOTE — TOC Progression Note (Addendum)
Transition of Care [facility]) - Progression Note    Patient Details  Name: Gregory Stone MRN: [id number] Date of Birth: 08/03/1946  Transition of Care [facility] CM/SW Contact  Margarito Liner, LCSW Phone Number: 01/16/2019, 12:01 PM  Clinical Narrative: Patient was not stable enough for transfer to residential hospice house yesterday and was an anticipated hospital death. Today MD says he has improved and stable enough to transfer. CSW left voicemail at [facility] to check on bed availability.  2:02 pm: Faxed today's palliative note to [facility]. Confirmed [facility] would be able to transport him to this facility if accepted.  2:16 pm: [facility] stated that they are not a residential hospice facility, instead they have acute beds which is "more like an ICU." They would have to show that they are "acutely managing his symptoms." She is going to review with their medical director and call CSW with decision.  Expected Discharge Plan: Skilled Nursing Facility Barriers to Discharge: No Barriers Identified  Expected Discharge Plan and Services Expected Discharge Plan: Skilled Nursing Facility In-house Referral: Clinical Social Work Discharge Planning Services: NA Post Acute Care Choice: Skilled Nursing Facility Living arrangements for the past 2 months: Skilled Nursing Facility Expected Discharge Date: 01/16/19               DME Arranged: N/A DME Agency: NA       HH Arranged: NA HH Agency: NA         Social Determinants of Health (SDOH) Interventions    Readmission Risk Interventions Readmission Risk Prevention Plan 12/29/2018  Transportation Screening Complete  PCP or Specialist Appt within 5-7 Days Complete  Home Care Screening Complete  Medication Review (RN CM) Complete

## 2019-01-16 NOTE — Care Management Important Message (Signed)
Important Message  Patient Details  Name: Divit Haake MRN: [id number] Date of Birth: 1946/03/07   Medicare Important Message Given:  Yes  Due to illness patient is not able to sign.  Unsigned copy left at bedside  [name] 01/16/2019, 3:24 PM

## 2019-01-16 NOTE — Discharge Summary (Signed)
Discharge Summary  Gregory Stone WUJ:[id number] DOB: Jul 13, 1946  PCP: Patient, No Pcp Per  Admit date: 12/26/2018 Discharge date: 01/16/2019  Time spent: 35 minutes  Recommendations for Outpatient Follow-up:  1. Continue with residential hospice care.  Discharge Diagnoses:  Active Hospital Problems   Diagnosis Date Noted   HCAP (healthcare-associated pneumonia) 12/27/2018   End stage COPD (HCC)    Acute respiratory failure with hypercapnia (HCC)    Acute on chronic respiratory failure with hypoxia and hypercapnia (HCC)    Palliative care encounter    Sepsis due to pneumonia (HCC) 12/27/2018   Diabetes mellitus type 2, uncontrolled, with complications (HCC) 12/27/2018   Anxiety 12/27/2018   COPD exacerbation (HCC) 12/26/2018   Atrial fibrillation with RVR (HCC) 12/20/2018   Acute on chronic respiratory failure with hypoxia (HCC) 12/12/2018   Acute metabolic encephalopathy 12/07/2018   Sepsis (HCC) 12/01/2018   Hyperkalemia 12/24/2018   Abnormal LFTs 12/06/2018   Elevated troponin 12/27/2018   COPD (chronic obstructive pulmonary disease) (HCC) 12/08/2018   Depression    Diabetes mellitus without complication (HCC)    HLD (hyperlipidemia)     Resolved Hospital Problems  No resolved problems to display.    Discharge Condition: Stable  Diet recommendation: Pleasure feedings.  Vitals:   01/15/19 1423 01/15/19 2142  BP:  131/69  Pulse:  82  Resp:    Temp:  98.8 F (37.1 C)  SpO2: 94% 96%    History of present illness:  73 y.o.BM PMHx hyperlipidemia, diabetes mellitus type II uncontrolled with complication, end stage COPD, chronic hypercarbic hypoxic respiratory failure, chronic atrial fibrillation on Eliquis,depression, who presented with altered mental status and shortness of breath. Per report, found to have O2 sats in 50s. He was given breathing treatments and had also been treated with a dose of Ativan then patient became minimally  responsive. In ED he was barely arousable. Negative COVID test, negative flu test. WBC is 12.3, lactic acid 4.9, troponin 0.03, UA suggestive of UTI. A. fib with RVR, O2 sats 100% on 5 L. ABG showed pH of 7.18, PCO2 103, PO2 142. Chest x-ray showed left upper lobe infiltrates. CT head negative. Treated for HCAP. BIPAP dependent with severe hypercarbia with PCO2 110. Palliative care team following. On 01/12/19 decision made for comfort care only. No BIPAP per family.  01/16/19: Seen and examined at his bedside.  No acute events overnight.  He is sitting up in bed and eating comfortably.  In no acute distress.  Awaiting placement at residential Hospice.  CSW assisting with placement.   Hospital Course:  Principal Problem:   HCAP (healthcare-associated pneumonia) Active Problems:   HLD (hyperlipidemia)   Diabetes mellitus without complication (HCC)   Depression   COPD (chronic obstructive pulmonary disease) (HCC)   Atrial fibrillation with RVR (HCC)   Acute on chronic respiratory failure with hypoxia (HCC)   Acute metabolic encephalopathy   Sepsis (HCC)   Hyperkalemia   Abnormal LFTs   Elevated troponin   COPD exacerbation (HCC)   Sepsis due to pneumonia (HCC)   Diabetes mellitus type 2, uncontrolled, with complications (HCC)   Anxiety   Acute on chronic respiratory failure with hypoxia and hypercapnia (HCC)   Palliative care encounter   End stage COPD (HCC)   Acute respiratory failure with hypercapnia (HCC)  Assessment: Acute metabolic encephalopathy likely secondary to severe hypercapnia Acute on chronic hypoxic hypercarbic respiratory failure secondary to HCAP Severe hypercapnia in the setting of end stage COPD  End-stage COPD Severe metabolic alkalosis secondary  to severe hypercapnia Paroxysmal A. fib Type 2 diabetes with hyperglycemia Chronic anxiety/depression Chronic macrocytic anemia Acute thrombocytopenia Failure to thrive in an adult Ambulatory  dysfunction  Plan: Patient is comfort measures only.  All means of treatment have been discontinued.  All focus is on the patient's comfort.        Consultants:  Palliative Care    Discharge Exam: BP 131/69 (BP Location: Right Arm)    Pulse 82    Temp 98.8 F (37.1 C) (Oral)    Resp 19    Ht 5' 8.5" (1.74 m)    Wt 55.5 kg    SpO2 96%    BMI 18.33 kg/m   General: 73 y.o. year-old male chronically ill-appearing in no acute distress.  Alert and interactive.  Respiratory: Bibasilar rales with no wheezes noted.  Poor inspiratory effort.  Abdomen: Soft nontender nondistended with bowels sounds x4.  Musculoskeletal: No lower extremity edema.  Discharge Instructions You were cared for by a hospitalist during your hospital stay. If you have any questions about your discharge medications or the care you received while you were in the hospital after you are discharged, you can call the unit and asked to speak with the hospitalist on call if the hospitalist that took care of you is not available. Once you are discharged, your primary care physician will handle any further medical issues. Please note that NO REFILLS for any discharge medications will be authorized once you are discharged, as it is imperative that you return to your primary care physician (or establish a relationship with a primary care physician if you do not have one) for your aftercare needs so that they can reassess your need for medications and monitor your lab values.   Allergies as of 01/16/2019      Reactions   Lisinopril Swelling   Angioedema   Sulfamethoxazole-trimethoprim Anaphylaxis, Other (See Comments)   Other reaction(s): Muscle Pain   Ticagrelor Shortness Of Breath      Trimethoprim Other (See Comments)   Per MAR      Medication List    STOP taking these medications   Acidophilus Probiotic 100 MG Caps   atorvastatin 40 MG tablet Commonly known as:  LIPITOR   doxycycline 100 MG tablet Commonly known  as:  VIBRA-TABS   Eliquis 5 MG Tabs tablet Generic drug:  apixaban   guaiFENesin 600 MG 12 hr tablet Commonly known as:  MUCINEX   ipratropium-albuterol 0.5-2.5 (3) MG/3ML Soln Commonly known as:  DUONEB   LORazepam 0.5 MG tablet Commonly known as:  ATIVAN   multivitamin with minerals Tabs tablet   pantoprazole 40 MG tablet Commonly known as:  PROTONIX   predniSONE 20 MG tablet Commonly known as:  DELTASONE   ProMod Liqd   sertraline 50 MG tablet Commonly known as:  ZOLOFT   vitamin C 500 MG tablet Commonly known as:  ASCORBIC ACID     TAKE these medications   Basaglar KwikPen 100 UNIT/ML Sopn Inject 0.05 mLs (5 Units total) into the skin at bedtime. What changed:    how much to take  when to take this  additional instructions   insulin aspart 100 UNIT/ML injection Commonly known as:  novoLOG Inject 0-9 Units into the skin 3 (three) times daily with meals. Sliding scale CBG 70 - 120: 0 units CBG 121 - 150: 1 unit,  CBG 151 - 200: 2 units,  CBG 201 - 250: 3 units,  CBG 251 - 300: 5 units,  CBG  301 - 350: 7 units,  CBG 351 - 400: 9 units   CBG > 400: 9 units and notify your MD   OXYGEN Inhale 3 L into the lungs continuous.      Allergies  Allergen Reactions   Lisinopril Swelling    Angioedema    Sulfamethoxazole-Trimethoprim Anaphylaxis and Other (See Comments)    Other reaction(s): Muscle Pain    Ticagrelor Shortness Of Breath        Trimethoprim Other (See Comments)    Per MAR   Contact information for after-discharge care    Destination    [facility] .   Service:  Skilled Nursing Contact information: [address] [place] [address] [phone]               The results of significant diagnostics from this hospitalization (including imaging, microbiology, ancillary and laboratory) are listed below for reference.    Significant Diagnostic Studies: Ct Head Wo Contrast  Result Date:  12/26/2018 CLINICAL DATA:  73 year old male with ataxia, encephalopathy, atrial fibrillation. EXAM: CT HEAD WITHOUT CONTRAST TECHNIQUE: Contiguous axial images were obtained from the base of the skull through the vertex without intravenous contrast. COMPARISON:  None. FINDINGS: Brain: Cerebral volume is within normal limits for age. No midline shift, ventriculomegaly, mass effect, evidence of mass lesion, intracranial hemorrhage or evidence of cortically based acute infarction. Patchy periatrial white matter hypodensity. No cortical encephalomalacia. Vascular: Calcified atherosclerosis at the skull base. No suspicious intracranial vascular hyperdensity. Skull: Negative. Sinuses/Orbits: Visualized paranasal sinuses and mastoids are well pneumatized. Other: Negative orbits. Visualized scalp soft tissues are within normal limits. IMPRESSION: 1. No acute intracranial abnormality. 2. Mild for age nonspecific cerebral white matter changes, most commonly due to small vessel disease. Electronically Signed   By: Odessa Fleming M.D.   On: 12/26/2018 01:49   Dg Chest Port 1 View  Result Date: 01/05/2019 CLINICAL DATA:  73 year old with dyspnea. EXAM: PORTABLE CHEST 1 VIEW COMPARISON:  12/31/2018 and Jan 15, 2019 FINDINGS: Residual interstitial densities in the left upper lobe. Persistent densities in the right lower chest compatible with pleural fluid and atelectasis/consolidation. Increased densities at the left lung base compatible with increasing left pleural fluid. There is a left arm PICC line with the catheter tip near the SVC and right atrium junction. Heart size is upper limits of normal but stable. IMPRESSION: 1. Increased densities at the left lung base are compatible with small left pleural effusion with left basilar atelectasis or consolidation. 2. Persistent right basilar densities compatible with small right pleural effusion with atelectasis or consolidation. 3. Persistent interstitial densities in left upper lung.  Concern for underlying pneumonia. Electronically Signed   By: Richarda Overlie M.D.   On: 01/05/2019 12:06   Dg Chest Port 1 View  Result Date: 12/31/2018 CLINICAL DATA:  Dyspnea at rest EXAM: PORTABLE CHEST 1 VIEW COMPARISON:  Jan 15, 2019 chest radiograph. FINDINGS: Stable cardiomediastinal silhouette with normal heart size. No pneumothorax. No left pleural effusion. Chronic blunting of the right costophrenic angle back to 11/26/2018. Hazy upper left lung and right lung base opacity is stable. New left retrocardiac consolidation. IMPRESSION: 1. New left retrocardiac consolidation. Stable hazy upper left lung and right lung base opacity. Findings suggest multilobar pneumonia. 2. Chronic blunting of the right costophrenic angle back to 11/26/2026 radiograph, compatible with small pleural effusion and/or pleural-parenchymal scarring. Electronically Signed   By: Delbert Phenix M.D.   On: 12/31/2018 15:01   Dg Chest Port 1 View  Result Date: 2019/01/15 CLINICAL  DATA:  73 year old male with sepsis. EXAM: PORTABLE CHEST 1 VIEW COMPARISON:  11/26/2018 and earlier. FINDINGS: Portable AP supine view at 2057 hours. Stable large lung volumes. Stable cardiac size and mediastinal contours. Visualized tracheal air column is within normal limits. No pneumothorax, pleural effusion or consolidation. New asymmetric reticulonodular opacity in the left upper lung. No acute osseous abnormality identified. IMPRESSION: New left upper lobe reticulonodular pulmonary opacity suspicious for acute viral/atypical respiratory infection. Underlying pulmonary hyperinflation. Electronically Signed   By: Odessa FlemingH  Merilynn Haydu M.D.   On: 12/27/2018 22:02   [place] Ekg Site Rite  Result Date: 01/01/2019 If Site Rite image not attached, placement could not be confirmed due to current cardiac rhythm.   Microbiology: No results found for this or any previous visit (from the past 240 hour(s)).   Labs: Basic Metabolic Panel: Recent Labs  Lab 01/10/19 0500  01/11/19 0706 01/12/19 0450  NA 143 144 142  K 4.4 4.4 4.4  CL 85* 84* 83*  CO2 >50* >50* >50*  GLUCOSE 108* 124* 122*  BUN 22 19 30*  CREATININE 0.57* 0.56* 0.46*  CALCIUM 9.3 9.8 9.6   Liver Function Tests: Recent Labs  Lab 01/11/19 0706  AST 18  ALT 20  ALKPHOS 35*  BILITOT 0.5  PROT 5.9*  ALBUMIN 2.6*   No results for input(s): LIPASE, AMYLASE in the last 168 hours. No results for input(s): AMMONIA in the last 168 hours. CBC: Recent Labs  Lab 01/10/19 0500 01/11/19 0706  WBC 5.8 5.3  HGB 7.7* 7.9*  HCT 27.8* 28.0*  MCV 103.7* 103.7*  PLT 104* 113*   Cardiac Enzymes: No results for input(s): CKTOTAL, CKMB, CKMBINDEX, TROPONINI in the last 168 hours. BNP: BNP (last 3 results) Recent Labs    12/26/18 0831 01/04/19 1541  BNP 96.7 92.6    ProBNP (last 3 results) No results for input(s): PROBNP in the last 8760 hours.  CBG: Recent Labs  Lab 01/11/19 1610 01/11/19 2129 01/12/19 0045 01/12/19 0803 01/12/19 1141  GLUCAP 245* 93 129* 153* 196*       Signed:  Darlin Droparole N Jaxsyn Catalfamo, MD [facility] 01/16/2019, 11:02 AM

## 2019-01-17 ENCOUNTER — Encounter (HOSPITAL_COMMUNITY): Payer: Self-pay | Admitting: General Practice

## 2019-01-17 MED ORDER — IPRATROPIUM-ALBUTEROL 0.5-2.5 (3) MG/3ML IN SOLN
3.0000 mL | Freq: Two times a day (BID) | RESPIRATORY_TRACT | Status: DC
  Administered 2019-01-18 – 2019-01-19 (×4): 3 mL via RESPIRATORY_TRACT
  Filled 2019-01-17 (×7): qty 3

## 2019-01-17 NOTE — Progress Notes (Signed)
Daily Progress Note   Patient Name: Gregory Stone       Date: 01/17/2019 DOB: 23-Aug-1946  Age: 73 y.o. MRN#: [id number] Attending Physician: Kathlen Mody, MD Primary Care Physician: Patient, No Pcp Per Admit Date: Jan 12, 2019  Reason for Consultation/Follow-up: Inpatient hospice referral, Non pain symptom management, Pain control and Psychosocial/spiritual support  Subjective: Spoke with patient at bedside.  He has no complaints.  His is dyspneic at rest on 3L of oxygen.  Steroids have been tapered off.  Neb treatments are scheduled and PRN.   Assessment: Bed bound debilitated, easily becomes hypercapnic.  End stage COPD.  We have been supporting him with additional steroids and nebulizers hoping to get him to hospice near his family.   Patient Profile/HPI:  73 y.o.malewith past medical history of COPD on 3L at home, Afib on eliquis, DM, and depressionwho was admitted on 05/15/2020with AMS and SOB. Reportedly oxygen sats were 50% at his facility prior to admission. Mr. Poitevint was admitted and treated for COPD exacerbation. Unfortunately he has  relapsed into hypercarbic respiratory failure repeatedly. Pulmonary has consulted and recommended Palliative care measures.  Length of Stay: 22  Current Medications: Scheduled Meds:  . ipratropium-albuterol  3 mL Nebulization BID    Continuous Infusions:   PRN Meds: antiseptic oral rinse, glycopyrrolate, ipratropium-albuterol, LORazepam, morphine injection, morphine CONCENTRATE, ondansetron **OR** ondansetron (ZOFRAN) IV, polyvinyl alcohol  Physical Exam        Thin, frail, flat affect today.   awake, alert.  Speaks 1 word sentences in order to breathe.  Vital Signs: BP 129/76   Pulse 92   Temp 98.2 F (36.8 C) (Oral)   Resp  14   Ht 5' 8.5" (1.74 m)   Wt 56.2 kg   SpO2 93%   BMI 18.58 kg/m  SpO2: SpO2: 93 % O2 Device: O2 Device: Nasal Cannula O2 Flow Rate: O2 Flow Rate (L/min): 3 L/min  Intake/output summary:   Intake/Output Summary (Last 24 hours) at 01/17/2019 1621 Last data filed at 01/17/2019 1300 Gross per 24 hour  Intake 762 ml  Output 700 ml  Net 62 ml   LBM: Last BM Date: 01/16/19 Baseline Weight: Weight: 61.7 kg Most recent weight: Weight: 56.2 kg       Palliative Assessment/Data:  30%   Flowsheet Rows     Most Recent  Value  Intake Tab  Referral Department  Hospitalist  Unit at Time of Referral  Med/Surg Unit  Date Notified  01/05/19  Palliative Care Type  New Palliative care  Reason for referral  Clarify Goals of Care  Date of Admission  December 27, 2018  Date first seen by Palliative Care  01/06/19  # of days Palliative referral response time  1 Day(s)  # of days IP prior to Palliative referral  11  Clinical Assessment  Psychosocial & Spiritual Assessment  Palliative Care Outcomes      Patient Active Problem List   Diagnosis Date Noted  . End stage COPD (HCC)   . Acute respiratory failure with hypercapnia (HCC)   . Acute on chronic respiratory failure with hypoxia and hypercapnia (HCC)   . Palliative care encounter   . Sepsis due to pneumonia (HCC) 12/27/2018  . Diabetes mellitus type 2, uncontrolled, with complications (HCC) 12/27/2018  . Anxiety 12/27/2018  . COPD exacerbation (HCC) 12/26/2018  . Pressure injury of skin 12/26/2018  . COPD (chronic obstructive pulmonary disease) (HCC) April 29, 202020  . Atrial fibrillation with RVR (HCC) April 29, 202020  . Acute on chronic respiratory failure with hypoxia (HCC) April 29, 202020  . Acute metabolic encephalopathy April 29, 202020  . Sepsis (HCC) April 29, 202020  . Hyperkalemia April 29, 202020  . Abnormal LFTs April 29, 202020  . Elevated troponin April 29, 202020  . HCAP (healthcare-associated pneumonia) April 29, 202020  . HLD (hyperlipidemia)   . Diabetes mellitus  without complication (HCC)   . Depression     Palliative Care Plan    Recommendations/Plan: Comfort measures.  Steroids completed today. [facility]   PMT will follow intermittently for support.   Goals of Care and Additional Recommendations:  Limitations on Scope of Treatment: Full Comfort Care  Code Status:  DNR  Prognosis:   < 2 weeks due to end stage respiratory failure with recurrent hypercarbia, bedbound status, profoundly deconditioned.   Comfort measures only status.  Discharge Planning:  Hospice facility  Care plan was discussed with Case manager, Steward DroneBrenda and social work Doctor, general practicearah.  Thank you for allowing the Palliative Medicine Team to assist in the care of this patient.  Total time spent:  25 min.     Greater than 50%  of this time was spent counseling and coordinating care related to the above assessment and plan.  Norvel RichardsMarianne Zoanne Newill, PA-C Palliative Medicine  Please contact Palliative MedicineTeam phone at [phone] for questions and concerns between 7 am - 7 pm.   Please see AMION for individual provider pager numbers.

## 2019-01-17 NOTE — Discharge Summary (Signed)
Discharge Summary  Gregory Stone UEA:[id number] DOB: March 31, 1946  PCP: Patient, No Pcp Per  Admit date: 09/19/2018 Discharge date: 01/18/2019  Time spent: 35 minutes  Recommendations for Outpatient Follow-up:  1. Continue with residential hospice care.  Discharge Diagnoses:  Active Hospital Problems   Diagnosis Date Noted   HCAP (healthcare-associated pneumonia) 001/21/2020   End stage COPD (HCC)    Acute respiratory failure with hypercapnia (HCC)    Acute on chronic respiratory failure with hypoxia and hypercapnia (HCC)    Palliative care encounter    Sepsis due to pneumonia (HCC) 12/27/2018   Diabetes mellitus type 2, uncontrolled, with complications (HCC) 12/27/2018   Anxiety 12/27/2018   COPD exacerbation (HCC) 12/26/2018   Atrial fibrillation with RVR (HCC) 001/21/2020   Acute on chronic respiratory failure with hypoxia (HCC) 001/21/2020   Acute metabolic encephalopathy 001/21/2020   Sepsis (HCC) 001/21/2020   Hyperkalemia 001/21/2020   Abnormal LFTs 001/21/2020   Elevated troponin 001/21/2020   COPD (chronic obstructive pulmonary disease) (HCC) 001/21/2020   Depression    Diabetes mellitus without complication (HCC)    HLD (hyperlipidemia)     Resolved Hospital Problems  No resolved problems to display.    Discharge Condition: Stable  Diet recommendation: Pleasure feedings.  Vitals:   01/17/19 0741 01/17/19 1341  BP:  129/76  Pulse:  92  Resp:    Temp:  98.2 F (36.8 C)  SpO2: 94% 93%    History of present illness:  73 y.o.BM PMHx hyperlipidemia, diabetes mellitus type II uncontrolled with complication, end stage COPD, chronic hypercarbic hypoxic respiratory failure, chronic atrial fibrillation on Eliquis,depression, who presented with altered mental status and shortness of breath. Per report, found to have O2 sats in 50s. He was given breathing treatments and had also been treated with a dose of Ativan then patient became minimally  responsive. In ED he was barely arousable. Negative COVID test, negative flu test. WBC is 12.3, lactic acid 4.9, troponin 0.03, UA suggestive of UTI. A. fib with RVR, O2 sats 100% on 5 L. ABG showed pH of 7.18, PCO2 103, PO2 142. Chest x-ray showed left upper lobe infiltrates. CT head negative. Treated for HCAP. BIPAP dependent with severe hypercarbia with PCO2 110. Palliative care team following. On 01/12/19 decision made for comfort care only. No BIPAP per family.  01/16/19: Seen and examined at his bedside.  No acute events overnight.  He is sitting up in bed and eating comfortably.  In no acute distress.  Awaiting placement at residential Hospice.  CSW assisting with placement.   Hospital Course:  Principal Problem:   HCAP (healthcare-associated pneumonia) Active Problems:   HLD (hyperlipidemia)   Diabetes mellitus without complication (HCC)   Depression   COPD (chronic obstructive pulmonary disease) (HCC)   Atrial fibrillation with RVR (HCC)   Acute on chronic respiratory failure with hypoxia (HCC)   Acute metabolic encephalopathy   Sepsis (HCC)   Hyperkalemia   Abnormal LFTs   Elevated troponin   COPD exacerbation (HCC)   Sepsis due to pneumonia (HCC)   Diabetes mellitus type 2, uncontrolled, with complications (HCC)   Anxiety   Acute on chronic respiratory failure with hypoxia and hypercapnia (HCC)   Palliative care encounter   End stage COPD (HCC)   Acute respiratory failure with hypercapnia (HCC)  Assessment: Acute metabolic encephalopathy likely secondary to severe hypercapnia Acute on chronic hypoxic hypercarbic respiratory failure secondary to HCAP Severe hypercapnia in the setting of end stage COPD  End-stage COPD Severe metabolic alkalosis secondary  to severe hypercapnia Paroxysmal A. fib Type 2 diabetes with hyperglycemia Chronic anxiety/depression Chronic macrocytic anemia Acute thrombocytopenia Failure to thrive in an adult Ambulatory  dysfunction  Plan: Patient is comfort measures only.  All means of treatment have been discontinued.  All focus is on the patient's comfort.        Consultants:  Palliative Care    Discharge Exam: BP 129/76    Pulse 92    Temp 98.2 F (36.8 C) (Oral)    Resp 14    Ht 5' 8.5" (1.74 m)    Wt 56.2 kg    SpO2 93%    BMI 18.58 kg/m   General: 73 y.o. year-old male chronically ill-appearing in no acute distress.  Alert and interactive.  Respiratory: Bibasilar rales with no wheezes noted.  Poor inspiratory effort.  Abdomen: Soft nontender nondistended with bowels sounds x4.  Musculoskeletal: No lower extremity edema.  Discharge Instructions You were cared for by a hospitalist during your hospital stay. If you have any questions about your discharge medications or the care you received while you were in the hospital after you are discharged, you can call the unit and asked to speak with the hospitalist on call if the hospitalist that took care of you is not available. Once you are discharged, your primary care physician will handle any further medical issues. Please note that NO REFILLS for any discharge medications will be authorized once you are discharged, as it is imperative that you return to your primary care physician (or establish a relationship with a primary care physician if you do not have one) for your aftercare needs so that they can reassess your need for medications and monitor your lab values.    Allergies  Allergen Reactions   Lisinopril Swelling    Angioedema    Sulfamethoxazole-Trimethoprim Anaphylaxis and Other (See Comments)    Other reaction(s): Muscle Pain    Ticagrelor Shortness Of Breath        Trimethoprim Other (See Comments)    Per MAR   Contact information for after-discharge care    Destination    [facility] .   Service:  Skilled Nursing Contact information: [address] [place]  [address] [phone]               The results of significant diagnostics from this hospitalization (including imaging, microbiology, ancillary and laboratory) are listed below for reference.    Significant Diagnostic Studies: Ct Head Wo Contrast  Result Date: 12/26/2018 CLINICAL DATA:  73 year old male with ataxia, encephalopathy, atrial fibrillation. EXAM: CT HEAD WITHOUT CONTRAST TECHNIQUE: Contiguous axial images were obtained from the base of the skull through the vertex without intravenous contrast. COMPARISON:  None. FINDINGS: Brain: Cerebral volume is within normal limits for age. No midline shift, ventriculomegaly, mass effect, evidence of mass lesion, intracranial hemorrhage or evidence of cortically based acute infarction. Patchy periatrial white matter hypodensity. No cortical encephalomalacia. Vascular: Calcified atherosclerosis at the skull base. No suspicious intracranial vascular hyperdensity. Skull: Negative. Sinuses/Orbits: Visualized paranasal sinuses and mastoids are well pneumatized. Other: Negative orbits. Visualized scalp soft tissues are within normal limits. IMPRESSION: 1. No acute intracranial abnormality. 2. Mild for age nonspecific cerebral white matter changes, most commonly due to small vessel disease. Electronically Signed   By: Odessa Fleming M.D.   On: 12/26/2018 01:49   Dg Chest Port 1 View  Result Date: 01/05/2019 CLINICAL DATA:  73 year old with dyspnea. EXAM: PORTABLE CHEST 1 VIEW COMPARISON:  12/31/2018 and  07-Jan-2019 FINDINGS: Residual interstitial densities in the left upper lobe. Persistent densities in the right lower chest compatible with pleural fluid and atelectasis/consolidation. Increased densities at the left lung base compatible with increasing left pleural fluid. There is a left arm PICC line with the catheter tip near the SVC and right atrium junction. Heart size is upper limits of normal but stable. IMPRESSION: 1. Increased densities at the left lung base  are compatible with small left pleural effusion with left basilar atelectasis or consolidation. 2. Persistent right basilar densities compatible with small right pleural effusion with atelectasis or consolidation. 3. Persistent interstitial densities in left upper lung. Concern for underlying pneumonia. Electronically Signed   By: Richarda Overlie M.D.   On: 01/05/2019 12:06   Dg Chest Port 1 View  Result Date: 12/31/2018 CLINICAL DATA:  Dyspnea at rest EXAM: PORTABLE CHEST 1 VIEW COMPARISON:  07-Jan-2019 chest radiograph. FINDINGS: Stable cardiomediastinal silhouette with normal heart size. No pneumothorax. No left pleural effusion. Chronic blunting of the right costophrenic angle back to 11/26/2018. Hazy upper left lung and right lung base opacity is stable. New left retrocardiac consolidation. IMPRESSION: 1. New left retrocardiac consolidation. Stable hazy upper left lung and right lung base opacity. Findings suggest multilobar pneumonia. 2. Chronic blunting of the right costophrenic angle back to 11/26/2026 radiograph, compatible with small pleural effusion and/or pleural-parenchymal scarring. Electronically Signed   By: Delbert Phenix M.D.   On: 12/31/2018 15:01   Dg Chest Port 1 View  Result Date: 01/07/19 CLINICAL DATA:  73 year old male with sepsis. EXAM: PORTABLE CHEST 1 VIEW COMPARISON:  11/26/2018 and earlier. FINDINGS: Portable AP supine view at 2057 hours. Stable large lung volumes. Stable cardiac size and mediastinal contours. Visualized tracheal air column is within normal limits. No pneumothorax, pleural effusion or consolidation. New asymmetric reticulonodular opacity in the left upper lung. No acute osseous abnormality identified. IMPRESSION: New left upper lobe reticulonodular pulmonary opacity suspicious for acute viral/atypical respiratory infection. Underlying pulmonary hyperinflation. Electronically Signed   By: Odessa Fleming M.D.   On: 01-07-2019 22:02   [place] Ekg Site Rite  Result Date:  01/01/2019 If Site Rite image not attached, placement could not be confirmed due to current cardiac rhythm.   Microbiology: No results found for this or any previous visit (from the past 240 hour(s)).   Labs: Basic Metabolic Panel: Recent Labs  Lab 01/11/19 0706 01/12/19 0450  NA 144 142  K 4.4 4.4  CL 84* 83*  CO2 >50* >50*  GLUCOSE 124* 122*  BUN 19 30*  CREATININE 0.56* 0.46*  CALCIUM 9.8 9.6   Liver Function Tests: Recent Labs  Lab 01/11/19 0706  AST 18  ALT 20  ALKPHOS 35*  BILITOT 0.5  PROT 5.9*  ALBUMIN 2.6*   No results for input(s): LIPASE, AMYLASE in the last 168 hours. No results for input(s): AMMONIA in the last 168 hours. CBC: Recent Labs  Lab 01/11/19 0706  WBC 5.3  HGB 7.9*  HCT 28.0*  MCV 103.7*  PLT 113*   Cardiac Enzymes: No results for input(s): CKTOTAL, CKMB, CKMBINDEX, TROPONINI in the last 168 hours. BNP: BNP (last 3 results) Recent Labs    12/26/18 0831 01/04/19 1541  BNP 96.7 92.6    ProBNP (last 3 results) No results for input(s): PROBNP in the last 8760 hours.  CBG: Recent Labs  Lab 01/11/19 1610 01/11/19 2129 01/12/19 0045 01/12/19 0803 01/12/19 1141  GLUCAP 245* 93 129* 153* 196*       Signed:  Jenni Thew  Blake Divine, MD [facility] 01/17/2019, 4:30 PM

## 2019-01-17 NOTE — TOC Progression Note (Addendum)
Transition of Care [facility]) - Progression Note    Patient Details  Name: Gregory Stone MRN: [id number] Date of Birth: 18-Oct-1945  Transition of Care [facility]) CM/SW Contact  Margarito Liner, LCSW Phone Number: 01/17/2019, 8:49 AM  Clinical Narrative: Received voicemail from [facility] hosp[facility] facility late yesterday stating that the medical director was willing to consider taking patient but wanted palliative provider to page her. CSW sent message to palliative PA with name and pager number.  1:58 pm: Left voicemail for Gavin Pound at [facility] facility to see if there are any updates. Palliative PA had paged their medical director at 10:00 and had not heard anything as of 11:30. Gavin Pound was going to check on that and said they were busy today but have not heard back.   2:28 pm: Updated daughter. She asked about looking at facilities in [place]. Faxed referral to [facility] in [place]. Per Inetta Fermo in the referral dept, they will call once referral reviewed.  4:08 pm: Confirmed the [facility] facility received the referral. They said they also received it on Friday so they just added this fax to the original packet. Faxed referral to [facility] in [place]. They will evaluate for residential hospice vs. GIP. If patient qualifies for residential hospice, he will have to pay $225 per day for room and board even though he has Medicaid. If patient qualifies for GIP, Medicare covers at 100%.  Expected Discharge Plan: Skilled Nursing Facility Barriers to Discharge: No Barriers Identified  Expected Discharge Plan and Services Expected Discharge Plan: Skilled Nursing Facility In-house Referral: Clinical Social Work Discharge Planning Services: NA Post Acute Care Choice: Skilled Nursing Facility Living arrangements for the past 2 months: Skilled Nursing Facility Expected Discharge Date: 01/16/19               DME Arranged: N/A DME Agency: NA        HH Arranged: NA HH Agency: NA         Social Determinants of Health (SDOH) Interventions    Readmission Risk Interventions Readmission Risk Prevention Plan 12/29/2018  Transportation Screening Complete  PCP or Specialist Appt within 5-7 Days Complete  Home Care Screening Complete  Medication Review (RN CM) Complete

## 2019-01-18 NOTE — Discharge Summary (Signed)
Discharge Summary  Gregory Stone AOZ:[id number] DOB: April 14, 1946  PCP: Patient, No Pcp Per  Admit date: 12/08/2018 Discharge date: 01/19/2019  Time spent: 35 minutes  Recommendations for Outpatient Follow-up:  1. Continue with residential hospice care.  Discharge Diagnoses:  Active Hospital Problems   Diagnosis Date Noted  . HCAP (healthcare-associated pneumonia) 12/04/2018  . End stage COPD (HCC)   . Acute respiratory failure with hypercapnia (HCC)   . Acute on chronic respiratory failure with hypoxia and hypercapnia (HCC)   . Palliative care encounter   . Sepsis due to pneumonia (HCC) 12/27/2018  . Diabetes mellitus type 2, uncontrolled, with complications (HCC) 12/27/2018  . Anxiety 12/27/2018  . COPD exacerbation (HCC) 12/26/2018  . Atrial fibrillation with RVR (HCC) 12/22/2018  . Acute on chronic respiratory failure with hypoxia (HCC) 12/13/2018  . Acute metabolic encephalopathy 12/02/2018  . Sepsis (HCC) 12/21/2018  . Hyperkalemia 12/15/2018  . Abnormal LFTs 12/19/2018  . Elevated troponin 12/04/2018  . COPD (chronic obstructive pulmonary disease) (HCC) 12/23/2018  . Depression   . Diabetes mellitus without complication (HCC)   . HLD (hyperlipidemia)     Resolved Hospital Problems  No resolved problems to display.    Discharge Condition: Stable  Diet recommendation: Pleasure feedings.  Vitals:   01/18/19 0719 01/18/19 0901  BP: (!) 144/79   Pulse: 99   Resp:    Temp:    SpO2: 100% 93%    History of present illness:  73 y.o.BM PMHx hyperlipidemia, diabetes mellitus type II uncontrolled with complication, end stage COPD, chronic hypercarbic hypoxic respiratory failure, chronic atrial fibrillation on Eliquis,depression, who presented with altered mental status and shortness of breath. Per report, found to have O2 sats in 50s. He was given breathing treatments and had also been treated with a dose of Ativan then patient became minimally responsive. In ED  he was barely arousable. Negative COVID test, negative flu test. WBC is 12.3, lactic acid 4.9, troponin 0.03, UA suggestive of UTI. A. fib with RVR, O2 sats 100% on 5 L. ABG showed pH of 7.18, PCO2 103, PO2 142. Chest x-ray showed left upper lobe infiltrates. CT head negative. Treated for HCAP. BIPAP dependent with severe hypercarbia with PCO2 110. Palliative care team following. On 01/12/19 decision made for comfort care only. No BIPAP per family.  01/18/19: Seen and examined at his bedside.  No acute events overnight.  He is sitting up in bed and eating comfortably.  In no acute distress.  Awaiting placement at residential Hospice.  CSW assisting with placement.   Hospital Course:  Principal Problem:   HCAP (healthcare-associated pneumonia) Active Problems:   HLD (hyperlipidemia)   Diabetes mellitus without complication (HCC)   Depression   COPD (chronic obstructive pulmonary disease) (HCC)   Atrial fibrillation with RVR (HCC)   Acute on chronic respiratory failure with hypoxia (HCC)   Acute metabolic encephalopathy   Sepsis (HCC)   Hyperkalemia   Abnormal LFTs   Elevated troponin   COPD exacerbation (HCC)   Sepsis due to pneumonia (HCC)   Diabetes mellitus type 2, uncontrolled, with complications (HCC)   Anxiety   Acute on chronic respiratory failure with hypoxia and hypercapnia (HCC)   Palliative care encounter   End stage COPD (HCC)   Acute respiratory failure with hypercapnia (HCC)  Assessment: Acute metabolic encephalopathy likely secondary to severe hypercapnia Acute on chronic hypoxic hypercarbic respiratory failure secondary to HCAP Severe hypercapnia in the setting of end stage COPD  End-stage COPD Severe metabolic alkalosis secondary to severe  hypercapnia Paroxysmal A. fib Type 2 diabetes with hyperglycemia Chronic anxiety/depression Chronic macrocytic anemia Acute thrombocytopenia Failure to thrive in an adult Ambulatory dysfunction  Plan: Patient is  comfort measures only.  All means of treatment have been discontinued.  All focus is on the patient's comfort.        Consultants:  Palliative Care    Discharge Exam: BP (!) 144/79 (BP Location: Right Arm)   Pulse 99   Temp 98.2 F (36.8 C) (Oral)   Resp 14   Ht 5' 8.5" (1.74 m)   Wt 56.2 kg   SpO2 93%   BMI 18.58 kg/m  . General: 73 y.o. year-old male chronically ill-appearing in no acute distress.  Alert and interactive. Marland Kitchen. Respiratory: Bibasilar rales with no wheezes noted.  Poor inspiratory effort.  Abdomen: Soft nontender nondistended with bowels sounds x4. . Musculoskeletal: No lower extremity edema.  Discharge Instructions You were cared for by a hospitalist during your hospital stay. If you have any questions about your discharge medications or the care you received while you were in the hospital after you are discharged, you can call the unit and asked to speak with the hospitalist on call if the hospitalist that took care of you is not available. Once you are discharged, your primary care physician will handle any further medical issues. Please note that NO REFILLS for any discharge medications will be authorized once you are discharged, as it is imperative that you return to your primary care physician (or establish a relationship with a primary care physician if you do not have one) for your aftercare needs so that they can reassess your need for medications and monitor your lab values.    Allergies  Allergen Reactions  . Lisinopril Swelling    Angioedema   . Sulfamethoxazole-Trimethoprim Anaphylaxis and Other (See Comments)    Other reaction(s): Muscle Pain   . Ticagrelor Shortness Of Breath       . Trimethoprim Other (See Comments)    Per MAR   Contact information for after-discharge care    Destination    [facility] .   Service:  Skilled Nursing Contact information: [address] [place] [id number] [phone]                The results of significant diagnostics from this hospitalization (including imaging, microbiology, ancillary and laboratory) are listed below for reference.    Significant Diagnostic Studies: Ct Head Wo Contrast  Result Date: 12/26/2018 CLINICAL DATA:  73 year old male with ataxia, encephalopathy, atrial fibrillation. EXAM: CT HEAD WITHOUT CONTRAST TECHNIQUE: Contiguous axial images were obtained from the base of the skull through the vertex without intravenous contrast. COMPARISON:  None. FINDINGS: Brain: Cerebral volume is within normal limits for age. No midline shift, ventriculomegaly, mass effect, evidence of mass lesion, intracranial hemorrhage or evidence of cortically based acute infarction. Patchy periatrial white matter hypodensity. No cortical encephalomalacia. Vascular: Calcified atherosclerosis at the skull base. No suspicious intracranial vascular hyperdensity. Skull: Negative. Sinuses/Orbits: Visualized paranasal sinuses and mastoids are well pneumatized. Other: Negative orbits. Visualized scalp soft tissues are within normal limits. IMPRESSION: 1. No acute intracranial abnormality. 2. Mild for age nonspecific cerebral white matter changes, most commonly due to small vessel disease. Electronically Signed   By: Odessa FlemingH  Hall M.D.   On: 12/26/2018 01:49   Dg Chest Port 1 View  Result Date: 01/05/2019 CLINICAL DATA:  73 year old with dyspnea. EXAM: PORTABLE CHEST 1 VIEW COMPARISON:  12/31/2018 and 12/14/2018 FINDINGS: Residual interstitial  densities in the left upper lobe. Persistent densities in the right lower chest compatible with pleural fluid and atelectasis/consolidation. Increased densities at the left lung base compatible with increasing left pleural fluid. There is a left arm PICC line with the catheter tip near the SVC and right atrium junction. Heart size is upper limits of normal but stable. IMPRESSION: 1. Increased densities at the left lung base are compatible with  small left pleural effusion with left basilar atelectasis or consolidation. 2. Persistent right basilar densities compatible with small right pleural effusion with atelectasis or consolidation. 3. Persistent interstitial densities in left upper lung. Concern for underlying pneumonia. Electronically Signed   By: Richarda Overlie M.D.   On: 01/05/2019 12:06   Dg Chest Port 1 View  Result Date: 12/31/2018 CLINICAL DATA:  Dyspnea at rest EXAM: PORTABLE CHEST 1 VIEW COMPARISON:  Jan 05, 2019 chest radiograph. FINDINGS: Stable cardiomediastinal silhouette with normal heart size. No pneumothorax. No left pleural effusion. Chronic blunting of the right costophrenic angle back to 11/26/2018. Hazy upper left lung and right lung base opacity is stable. New left retrocardiac consolidation. IMPRESSION: 1. New left retrocardiac consolidation. Stable hazy upper left lung and right lung base opacity. Findings suggest multilobar pneumonia. 2. Chronic blunting of the right costophrenic angle back to 11/26/2026 radiograph, compatible with small pleural effusion and/or pleural-parenchymal scarring. Electronically Signed   By: Delbert Phenix M.D.   On: 12/31/2018 15:01   Dg Chest Port 1 View  Result Date: 2019-01-05 CLINICAL DATA:  73 year old male with sepsis. EXAM: PORTABLE CHEST 1 VIEW COMPARISON:  11/26/2018 and earlier. FINDINGS: Portable AP supine view at 2057 hours. Stable large lung volumes. Stable cardiac size and mediastinal contours. Visualized tracheal air column is within normal limits. No pneumothorax, pleural effusion or consolidation. New asymmetric reticulonodular opacity in the left upper lung. No acute osseous abnormality identified. IMPRESSION: New left upper lobe reticulonodular pulmonary opacity suspicious for acute viral/atypical respiratory infection. Underlying pulmonary hyperinflation. Electronically Signed   By: Odessa Fleming M.D.   On: 2019-01-05 22:02   [place] Ekg Site Rite  Result Date: 01/01/2019 If Site Rite image  not attached, placement could not be confirmed due to current cardiac rhythm.   Microbiology: No results found for this or any previous visit (from the past 240 hour(s)).   Labs: Basic Metabolic Panel: Recent Labs  Lab 01/12/19 0450  NA 142  K 4.4  CL 83*  CO2 >50*  GLUCOSE 122*  BUN 30*  CREATININE 0.46*  CALCIUM 9.6   Liver Function Tests: No results for input(s): AST, ALT, ALKPHOS, BILITOT, PROT, ALBUMIN in the last 168 hours. No results for input(s): LIPASE, AMYLASE in the last 168 hours. No results for input(s): AMMONIA in the last 168 hours. CBC: No results for input(s): WBC, NEUTROABS, HGB, HCT, MCV, PLT in the last 168 hours. Cardiac Enzymes: No results for input(s): CKTOTAL, CKMB, CKMBINDEX, TROPONINI in the last 168 hours. BNP: BNP (last 3 results) Recent Labs    12/26/18 0831 01/04/19 1541  BNP 96.7 92.6    ProBNP (last 3 results) No results for input(s): PROBNP in the last 8760 hours.  CBG: Recent Labs  Lab 01/11/19 2129 01/12/19 0045 01/12/19 0803 01/12/19 1141  GLUCAP 93 129* 153* 196*       Signed:  Kathlen Mody, MD [facility] 01/18/2019, 6:28 PM

## 2019-01-18 NOTE — TOC Progression Note (Signed)
Transition of Care [facility]) - Progression Note    Patient Details  Name: Beowulf Repetti MRN: [id number] Date of Birth: 1946-02-17  Transition of Care [facility]) CM/SW Contact  Doy Hutching, [place] Phone Number: 01/18/2019, 3:19 PM  Clinical Narrative:    Still have not received a return call from pt daughter. CSW contacted [facility] [phone]) to see if they would be able to reach pt daughter and discuss costs also.   Expected Discharge Plan: Skilled Nursing Facility Barriers to Discharge: No Barriers Identified  Expected Discharge Plan and Services Expected Discharge Plan: Skilled Nursing Facility In-house Referral: Clinical Social Work Discharge Planning Services: NA Post Acute Care Choice: Skilled Nursing Facility Living arrangements for the past 2 months: Skilled Nursing Facility Expected Discharge Date: 01/16/19               DME Arranged: N/A DME Agency: NA       HH Arranged: NA HH Agency: NA         Social Determinants of Health (SDOH) Interventions    Readmission Risk Interventions Readmission Risk Prevention Plan 12/29/2018  Transportation Screening Complete  PCP or Specialist Appt within 5-7 Days Complete  Home Care Screening Complete  Medication Review (RN CM) Complete

## 2019-01-18 NOTE — TOC Progression Note (Addendum)
Transition of Care [facility]) - Progression Note    Patient Details  Name: Gregory Stone MRN: [id number] Date of Birth: October 15, 1945  Transition of Care [facility]) CM/SW Contact  Margarito Liner, LCSW Phone Number: 01/18/2019, 8:43 AM  Clinical Narrative: Received message from palliative PA yesterday that patient has been accepted on the waiting list for a residential hospice bed at the [facility] hospice house in [place]. This will cost the family $225 out of pocket per day. Left voicemail at the [facility] hospice to check status of referral.  11:13 am: The [place] facility can take patient today if family can pay $225 per day. Left daughter a voicemail to see if they can manage this.   Expected Discharge Plan: Skilled Nursing Facility Barriers to Discharge: No Barriers Identified  Expected Discharge Plan and Services Expected Discharge Plan: Skilled Nursing Facility In-house Referral: Clinical Social Work Discharge Planning Services: NA Post Acute Care Choice: Skilled Nursing Facility Living arrangements for the past 2 months: Skilled Nursing Facility Expected Discharge Date: 01/16/19               DME Arranged: N/A DME Agency: NA       HH Arranged: NA HH Agency: NA         Social Determinants of Health (SDOH) Interventions    Readmission Risk Interventions Readmission Risk Prevention Plan 12/29/2018  Transportation Screening Complete  PCP or Specialist Appt within 5-7 Days Complete  Home Care Screening Complete  Medication Review (RN CM) Complete

## 2019-01-19 NOTE — TOC Progression Note (Signed)
Transition of Care [facility]) - Progression Note    Patient Details  Name: Gregory Stone MRN: [id number] Date of Birth: 1946/03/12  Transition of [facility]) CM/SW Contact  Nada Boozer Betsabe Iglesia, LCSWA Phone Number: 01/19/2019, 2:05 PM  Clinical Narrative:     CSW received a phone call from the patient's daughter stating that she would not be able to afford the $225.00 per day charge at the [facility] facility. CSW called and spoke with [facility] in palliative. Lowella Bandy stated that the patient is now receiving PO IV medications for symptom management. CSW called and spoke with Morrie Sheldon at the [facility] facility, they would be willing to look at the patient's updated notes, CSW faxed over most recent MD note.   CSW received a phone call back from [place], the facility would still have to charge $225.00 per day because they did not see any change in the patient's situation. CSW called and spoke with the patient's daughter, CSW asked if it would be okay to make a referral to [facility]. She stated that would be acceptable. It is only 30 minutes from her brothers and she is about 40 minutes away.   CSW called and spoke with Carley Hammed to make the referral. [facility] does not have any beds available today. Carley Hammed stated that Chales Abrahams would be on call this weekend and would be able to assist with the referral.   CSW will continue to follow over the weekend.    Expected Discharge Plan: Skilled Nursing Facility Barriers to Discharge: No Barriers Identified  Expected Discharge Plan and Services Expected Discharge Plan: Skilled Nursing Facility In-house Referral: Clinical Social Work Discharge Planning Services: NA Post Acute Care Choice: Skilled Nursing Facility Living arrangements for the past 2 months: Skilled Nursing Facility Expected Discharge Date: 01/16/19               DME Arranged: N/A DME Agency: NA       HH Arranged: NA HH Agency: NA         Social Determinants of  Health (SDOH) Interventions    Readmission Risk Interventions Readmission Risk Prevention Plan 12/29/2018  Transportation Screening Complete  PCP or Specialist Appt within 5-7 Days Complete  Home Care Screening Complete  Medication Review (RN CM) Complete

## 2019-01-19 NOTE — Progress Notes (Signed)
PROGRESS NOTE    Gregory OresJoshua Paar  VHQ:[id number] DOB: [birth year] DOA: 12/24/2018 PCP: Patient, No Pcp Per    Brief Narrative:  73 y.o.BM PMHx hyperlipidemia, diabetes mellitus type II uncontrolled with complication, end stage COPD,chronic hypercarbic hypoxic respiratory failure, chronicatrial fibrillation on Eliquis,depression, whopresented with altered mental status and shortness of breath. Per report, found to have O2 sats in 50s. He was given breathing treatments and had also been treated with a dose of Ativan then patient became minimally responsive. In ED he was barely arousable. Negative COVID test, negative flu test. WBC is 12.3, lactic acid 4.9, troponin 0.03, UA suggestive of UTI. A. fib with RVR, O2 sats 100% on 5 L. ABG showed pH of 7.18, PCO2 103, PO2 142. Chest x-ray showed left upper lobe infiltrates. CT head negative. Treated for HCAP. BIPAP dependent with severe hypercarbia with PCO2 110. Palliative care team following. On 01/12/19 decision made for comfort care only. No BIPAP per family.  Assessment & Plan:   Principal Problem:   HCAP (healthcare-associated pneumonia) Active Problems:   HLD (hyperlipidemia)   Diabetes mellitus without complication (HCC)   Depression   COPD (chronic obstructive pulmonary disease) (HCC)   Atrial fibrillation with RVR (HCC)   Acute on chronic respiratory failure with hypoxia (HCC)   Acute metabolic encephalopathy   Sepsis (HCC)   Hyperkalemia   Abnormal LFTs   Elevated troponin   COPD exacerbation (HCC)   Sepsis due to pneumonia (HCC)   Diabetes mellitus type 2, uncontrolled, with complications (HCC)   Anxiety   Acute on chronic respiratory failure with hypoxia and hypercapnia (HCC)   Palliative care encounter   End stage COPD (HCC)   Acute respiratory failure with hypercapnia (HCC)   Patient is comfort measures only.  Focus on pt's comfort.    DVT prophylaxis: SCD'S Code Status: DNR Family Communication: none  at bedside. Called daughter, the call didn't go through.  Disposition Plan: awaiting for facility. .   Consultants:   Palliative care.   Procedures:  none.  Antimicrobials: none.   Subjective: Appears comfortable.   Objective: Vitals:   01/18/19 2111 01/19/19 0015 01/19/19 0837 01/19/19 1539  BP: 116/84     Pulse: 98   91  Resp: 19     Temp:  97.9 F (36.6 C)    TempSrc:  Oral    SpO2:   94% 97%  Weight:      Height:        Intake/Output Summary (Last 24 hours) at 01/19/2019 1548 Last data filed at 01/19/2019 0651 Gross per 24 hour  Intake 240 ml  Output 500 ml  Net -260 ml   Filed Weights   01/13/19 0558 01/14/19 0434 01/17/19 0622  Weight: 55.5 kg 55.5 kg 56.2 kg    Examination:  General exam: Appears calm and comfortable  Respiratory system: diminished at bases, no wheezing or rhonchi.  Cardiovascular system: S1 & S2 heard, RRR.  Gastrointestinal system: Abdomen is nondistended, soft and nontender. No organomegaly or masses felt. Normal bowel sounds heard. Central nervous system: Alert and oriented. Extremities: no pedal edema.  Skin: No rashes, lesions or ulcers Psychiatry: flat affect.     Data Reviewed: I have personally reviewed following labs and imaging studies  CBC: No results for input(s): WBC, NEUTROABS, HGB, HCT, MCV, PLT in the last 168 hours. Basic Metabolic Panel: No results for input(s): NA, K, CL, CO2, GLUCOSE, BUN, CREATININE, CALCIUM, MG, PHOS in the last 168 hours. GFR: Estimated Creatinine Clearance: 68.3 mL/min (  A) (by C-G formula based on SCr of 0.46 mg/dL (L)). Liver Function Tests: No results for input(s): AST, ALT, ALKPHOS, BILITOT, PROT, ALBUMIN in the last 168 hours. No results for input(s): LIPASE, AMYLASE in the last 168 hours. No results for input(s): AMMONIA in the last 168 hours. Coagulation Profile: No results for input(s): INR, PROTIME in the last 168 hours. Cardiac Enzymes: No results for input(s): CKTOTAL, CKMB,  CKMBINDEX, TROPONINI in the last 168 hours. BNP (last 3 results) No results for input(s): PROBNP in the last 8760 hours. HbA1C: No results for input(s): HGBA1C in the last 72 hours. CBG: No results for input(s): GLUCAP in the last 168 hours. Lipid Profile: No results for input(s): CHOL, HDL, LDLCALC, TRIG, CHOLHDL, LDLDIRECT in the last 72 hours. Thyroid Function Tests: No results for input(s): TSH, T4TOTAL, FREET4, T3FREE, THYROIDAB in the last 72 hours. Anemia Panel: No results for input(s): VITAMINB12, FOLATE, FERRITIN, TIBC, IRON, RETICCTPCT in the last 72 hours. Sepsis Labs: No results for input(s): PROCALCITON, LATICACIDVEN in the last 168 hours.  No results found for this or any previous visit (from the past 240 hour(s)).       Radiology Studies: No results found.      Scheduled Meds: . ipratropium-albuterol  3 mL Nebulization BID   Continuous Infusions:   LOS: 24 days    Time spent: 15 minutes    Kathlen Mody, MD [facility] Pager [phone] If 7PM-7AM, please contact night-coverage [email or web address] Password [facility] 01/19/2019, 3:48 PM

## 2019-01-19 NOTE — Care Management Important Message (Signed)
Important Message  Patient Details  Name: Gregory Stone MRN: [id number] Date of Birth: 1945-12-07   Medicare Important Message Given:  Yes    [name] 01/19/2019, 12:37 PM

## 2019-01-19 NOTE — Progress Notes (Signed)
Daily Progress Note   Patient Name: Gregory Stone       Date: 01/19/2019 DOB: 1946-04-11  Age: 73 y.o. MRN#: [id number] Attending Physician: Kathlen Mody, MD Primary Care Physician: Patient, No Pcp Per Admit Date: 01-05-19  Reason for Consultation/Follow-up: Establishing goals of care  Subjective: Patient asleep, easily aroused. He denies pain but does report shortness of breath. He is on 3L/HFNC. She is appears short of breath with some pursed lip breathing. Bedside RN aware and preparing to administer prn for shortness of breath. He has taken some bites from his breakfast tray.   Unable to hold lengthy conversations due to shortness of breath. He states he is tired.   Attempted to reach his daughter yesterday and also at the bedside. No response and unable to leave a voicemail. States it is full.   Patient remains on full comfort. Unfortunately he has been unable to transfer to hospice facility as they are requiring daily payments by family.     Assessment: Patient with end stage COPD and recurrent hypercapnia.   Full Comfort. Wean off of HFNC.    Patient Profile/HPI:  73 y.o. male  with past medical history of COPD on 3L at home, Afib on eliquis, DM, and depression who was admitted on 01-05-19 with AMS and SOB.  Reportedly oxygen sats were 50% at his facility prior to admission.   Gregory Stone was admitted and treated for COPD exacerbation. Unfortunately he has improved almost to discharge and then relapsed into hypercarbic respiratory failure twice. Pulmonary has consulted and recommended Palliative care be consulted.   Length of Stay: 24  Current Medications: Scheduled Meds:   ipratropium-albuterol  3 mL Nebulization BID    Continuous Infusions:   PRN Meds: antiseptic  oral rinse, glycopyrrolate, ipratropium-albuterol, LORazepam, morphine injection, morphine CONCENTRATE, ondansetron **OR** ondansetron (ZOFRAN) IV, polyvinyl alcohol  Physical Exam Vitals signs and nursing note reviewed.   Thin, frail, AA male,  Shortness of breath, generalized weakness, dyspneic at rest and with exertion, asleep, but easily aroused, confusion at times, RRR, 3L/HFNC, abdomen soft, non-distended, non-tender.    Vital Signs: BP 116/84 (BP Location: Right Arm)    Pulse 98    Temp 97.9 F (36.6 C) (Oral)    Resp 19    Ht 5' 8.5" (1.74 m)    Wt 56.2 kg  SpO2 94%    BMI 18.58 kg/m  SpO2: SpO2: 94 % O2 Device: O2 Device: High Flow Nasal Cannula O2 Flow Rate: O2 Flow Rate (L/min): 3 L/min  Intake/output summary:   Intake/Output Summary (Last 24 hours) at 01/19/2019 1045 Last data filed at 01/19/2019 [id number] Gross per 24 hour  Intake 240 ml  Output 900 ml  Net -660 ml   LBM: Last BM Date: 01/16/19 Baseline Weight: Weight: 61.7 kg Most recent weight: Weight: 56.2 kg       Palliative Assessment/Data:  FULL COMFORT    Flowsheet Rows     Most Recent Value  Intake Tab  Referral Department  Hospitalist  Unit at Time of Referral  Med/Surg Unit  Date Notified  01/05/19  Palliative Care Type  New Palliative care  Reason for referral  Clarify Goals of Care  Date of Admission  2018/12/19  Date first seen by Palliative Care  01/06/19  # of days Palliative referral response time  1 Day(s)  # of days IP prior to Palliative referral  11  Clinical Assessment  Psychosocial & Spiritual Assessment  Palliative Care Outcomes      Patient Active Problem List   Diagnosis Date Noted   End stage COPD (HCC)    Acute respiratory failure with hypercapnia (HCC)    Acute on chronic respiratory failure with hypoxia and hypercapnia (HCC)    Palliative care encounter    Sepsis due to pneumonia (HCC) 12/27/2018   Diabetes mellitus type 2, uncontrolled, with complications (HCC)  12/27/2018   Anxiety 12/27/2018   COPD exacerbation (HCC) 12/26/2018   Pressure injury of skin 12/26/2018   COPD (chronic obstructive pulmonary disease) (HCC) 2020/11/2118   Atrial fibrillation with RVR (HCC) 2020/11/2118   Acute on chronic respiratory failure with hypoxia (HCC) 2020/11/2118   Acute metabolic encephalopathy 2020/11/2118   Sepsis (HCC) 2020/11/2118   Hyperkalemia 2020/11/2118   Abnormal LFTs 2020/11/2118   Elevated troponin 2020/11/2118   HCAP (healthcare-associated pneumonia) 2020/11/2118   HLD (hyperlipidemia)    Diabetes mellitus without complication (HCC)    Depression     Palliative Care Plan    Recommendations/Plan:  DNR/DNI  FULL COMFORT  CSW/CM working with family on hospice home placement. Family lives in the [place] area and would like him close to them.   RN to wean patient off of HFNC and to 2L/[place] for comfort only. Treat symptoms of air hunger/agitation with PRNs.   PMT will continue to support and follow.    Goals of Care and Additional Recommendations:  Limitations on Scope of Treatment: Full Comfort Care  Code Status:  DNR  Prognosis:  Days to Weeks in the setting of poor po intake, lethargy, hypoxic hypercarbic respiratory failure, end-stage COPD  Discharge Planning:  Hospice facility  Care plan was discussed with Daughter, Gregory Stone, bedside RN, and CSW.    Thank you for allowing the Palliative Medicine Team to assist in the care of this patient.  Total Time: 35 min.   Greater than 50%  of this time was spent counseling and coordinating care related to the above assessment and plan.  Willette AlmaNikki Pickenpack-Cousar, AGPCNP-BC Palliative Medicine Team  Phone: [phone] Pager: [phone] Amion: Thea AlkenN. Cousar     Please contact Palliative MedicineTeam phone at [phone] for questions and concerns between 7 am - 7 pm.   Please see AMION for individual provider pager numbers.

## 2019-01-20 NOTE — Progress Notes (Signed)
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 Per report, found to have O2 sats in 50s. He was given breathing treatments and had also been treated with a dose of Ativan then patient became minimally responsive. In ED he was barely arousable. Negative COVID test, negative flu test. WBC is 12.3, lactic acid 4.9, troponin 0.03, UA suggestive of UTI. A. fib with RVR, O2 sats 100% on 5 L. ABG showed pH of 7.18, PCO2 103, PO2 142. Chest x-ray showed left upper lobe infiltrates. CT head negative. Treated for HCAP. BIPAP dependent with severe hypercarbia with PCO2 110. Palliative care team following. On 01/12/19 decision made for comfort care only. No BIPAP per family.  Assessment & Plan:   Principal Problem:   HCAP (healthcare-associated pneumonia) Active Problems:   HLD (hyperlipidemia)   Diabetes mellitus without complication (HCC)   Depression   COPD (chronic obstructive pulmonary disease) (HCC)   Atrial fibrillation with RVR (HCC)   Acute on chronic respiratory failure with hypoxia (HCC)   Acute metabolic encephalopathy   Sepsis (HCC)   Hyperkalemia   Abnormal LFTs   Elevated troponin   COPD exacerbation (HCC)   Sepsis due to pneumonia (HCC)   Diabetes mellitus type 2, uncontrolled, with complications (HCC)   Anxiety   Acute on chronic respiratory failure with hypoxia and hypercapnia (HCC)   Palliative care encounter   End stage COPD (HCC)   Acute respiratory failure with hypercapnia (HCC)   Patient is comfort measures only.  Focus on pt's comfort.    DVT prophylaxis: SCD'S Code Status: DNR Family Communication: none  at bedside. Called daughter, the call didn't go through.  Disposition Plan: awaiting for facility. .   Consultants:   Palliative care.   Procedures:  none.  Antimicrobials: none.   Subjective: Appears comfortable.   Objective: Vitals:   01/19/19 0837 01/19/19 1539 01/19/19 2102 01/19/19 2103  BP:   (!) 131/94   Pulse:  91 99 82  Resp:    16  Temp:   98.2 F (36.8 C)   TempSrc:   Oral   SpO2: 94% 97% 97% 94%  Weight:      Height:       No intake or output data in the 24 hours ending 01/20/19 1905 Filed Weights   01/13/19 0558 01/14/19 0434 01/17/19 0622  Weight: 55.5 kg 55.5 kg 56.2 kg    Examination:  General exam: lethargic,with agonal breathing.  Respiratory system: diminished at bases,   Cardiovascular system: S1 & S2 heard, RRR.  Gastrointestinal system: Abdomen is nondistended, soft and nontender.    Data Reviewed: I have personally reviewed following labs and imaging studies  CBC: No results for input(s): WBC, NEUTROABS, HGB, HCT, MCV, PLT in the last 168 hours. Basic Metabolic Panel: No results for input(s): NA, K, CL, CO2, GLUCOSE, BUN, CREATININE, CALCIUM, MG, PHOS in the last 168 hours. GFR: Estimated Creatinine Clearance: 68.3 mL/min (A) (by C-G formula based on SCr of 0.46 mg/dL (L)). Liver Function Tests: No results for input(s): AST, ALT, ALKPHOS, BILITOT, PROT, ALBUMIN in the last 168 hours. No results for input(s): LIPASE, AMYLASE in the last 168 hours. No results for input(s): AMMONIA in the last 168 hours. Coagulation Profile: No results  for input(s): INR, PROTIME in the last 168 hours. Cardiac Enzymes: No results for input(s): CKTOTAL, CKMB, CKMBINDEX, TROPONINI in the last 168 hours. BNP (last 3 results) No results for input(s): PROBNP in the last 8760 hours. HbA1C: No results for input(s): HGBA1C in the last 72 hours. CBG: No results for input(s): GLUCAP in the last 168 hours. Lipid Profile: No results for input(s): CHOL, HDL,  LDLCALC, TRIG, CHOLHDL, LDLDIRECT in the last 72 hours. Thyroid Function Tests: No results for input(s): TSH, T4TOTAL, FREET4, T3FREE, THYROIDAB in the last 72 hours. Anemia Panel: No results for input(s): VITAMINB12, FOLATE, FERRITIN, TIBC, IRON, RETICCTPCT in the last 72 hours. Sepsis Labs: No results for input(s): PROCALCITON, LATICACIDVEN in the last 168 hours.  No results found for this or any previous visit (from the past 240 hour(s)).       Radiology Studies: No results found.      Scheduled Meds: . ipratropium-albuterol  3 mL Nebulization BID   Continuous Infusions:   LOS: 25 days    Time spent: 15 minutes    Kathlen Mody, MD [facility] Pager [phone] If 7PM-7AM, please contact night-coverage [email or web address] Password [facility] 01/20/2019, 7:05 PM

## 2019-01-20 NOTE — Progress Notes (Signed)
CSW received notification from Gregory Stone with [facility] that [facility] does not have any beds available at this time.   That is subject to change, Gregory Stone will contact the CSW with bed updates.   CSW will continue to follow.   Drucilla Schmidt, MSW, LCSW-A Clinical Social Worker [employer]

## 2019-01-20 NOTE — Progress Notes (Signed)
   01/20/19 0552  Clinical Encounter Type  Visited With Patient and family together;Health care provider  Visit Type Initial;Patient actively dying  Referral From Nurse;Family  Spiritual Encounters  Spiritual Needs Prayer;Grief support;Emotional  Stress Factors  Family Stress Factors Loss;Major life changes   Chaplain responded to an end-of-life situation, with patient on comfort care and family (daughter, two sons) at bedside. Chaplain offered prayer and support. Spiritual care services available as needed.   Alda Ponder, Chaplain

## 2019-01-22 NOTE — Discharge Summary (Signed)
Death Summary  Gregory Stone FSF:[id number] DOB: 05/27/1946 DOA: 01/11/19  PCP: Patient, No Pcp Per  Admit date: 01/11/19 Date of Death: February 07, 2019 Time of Death: 01/01/2204   History of present illness:    73 y.o.BM PMHx hyperlipidemia, diabetes mellitus type II uncontrolled with complication, end stage COPD, chronic hypercarbic hypoxic respiratory failure, chronic atrial fibrillation on Eliquis,depression, who presented with altered mental status and shortness of breath. Per report, found to have O2 sats in 50s. He was given breathing treatments and had also been treated with a dose of Ativan then patient became minimally responsive. In ED he was barely arousable. Negative COVID test, negative flu test. WBC is 12.3, lactic acid 4.9, troponin 0.03, UA suggestive of UTI. A. fib with RVR, O2 sats 100% on 5 L. ABG showed pH of 7.18, PCO2 103, PO2 142. Chest x-ray showed left upper lobe infiltrates. CT head negative. Treated for HCAP. BIPAP dependent with severe hypercarbia with PCO2 110. Palliative care team consulted and following the patient. On 01/12/19 decision made for comfort care only. No BIPAP per family. Patient was on comfort measures and he passed away at 2204-01-01 on Feb 07, 2023.    HCAP (healthcare-associated pneumonia) Active Problems:   HLD (hyperlipidemia)   Diabetes mellitus without complication (HCC)   Depression   COPD (chronic obstructive pulmonary disease) (HCC)   Atrial fibrillation with RVR (HCC)   Acute on chronic respiratory failure with hypoxia (HCC)   Acute metabolic encephalopathy   Sepsis (HCC)   Hyperkalemia   Abnormal LFTs   Elevated troponin   COPD exacerbation (HCC)   Sepsis due to pneumonia (HCC)   Diabetes mellitus type 2, uncontrolled, with complications (HCC)   Anxiety   Acute on chronic respiratory failure with hypoxia and hypercapnia (HCC)   Palliative care encounter   End stage COPD (HCC)   Acute respiratory failure with hypercapnia  (HCC)   Patient is comfort measures only and he passed away at 01-Jan-2204.     The results of significant diagnostics from this hospitalization (including imaging, microbiology, ancillary and laboratory) are listed below for reference.    Significant Diagnostic Studies: Ct Head Wo Contrast  Result Date: 12/26/2018 CLINICAL DATA:  74 year old male with ataxia, encephalopathy, atrial fibrillation. EXAM: CT HEAD WITHOUT CONTRAST TECHNIQUE: Contiguous axial images were obtained from the base of the skull through the vertex without intravenous contrast. COMPARISON:  None. FINDINGS: Brain: Cerebral volume is within normal limits for age. No midline shift, ventriculomegaly, mass effect, evidence of mass lesion, intracranial hemorrhage or evidence of cortically based acute infarction. Patchy periatrial white matter hypodensity. No cortical encephalomalacia. Vascular: Calcified atherosclerosis at the skull base. No suspicious intracranial vascular hyperdensity. Skull: Negative. Sinuses/Orbits: Visualized paranasal sinuses and mastoids are well pneumatized. Other: Negative orbits. Visualized scalp soft tissues are within normal limits. IMPRESSION: 1. No acute intracranial abnormality. 2. Mild for age nonspecific cerebral white matter changes, most commonly due to small vessel disease. Electronically Signed   By: Odessa Fleming M.D.   On: 12/26/2018 01:49   Dg Chest Port 1 View  Result Date: 01/05/2019 CLINICAL DATA:  73 year old with dyspnea. EXAM: PORTABLE CHEST 1 VIEW COMPARISON:  12/31/2018 and 2019-01-11 FINDINGS: Residual interstitial densities in the left upper lobe. Persistent densities in the right lower chest compatible with pleural fluid and atelectasis/consolidation. Increased densities at the left lung base compatible with increasing left pleural fluid. There is a left arm PICC line with the catheter tip near the SVC and right atrium junction. Heart size is upper limits of  normal but stable. IMPRESSION: 1.  Increased densities at the left lung base are compatible with small left pleural effusion with left basilar atelectasis or consolidation. 2. Persistent right basilar densities compatible with small right pleural effusion with atelectasis or consolidation. 3. Persistent interstitial densities in left upper lung. Concern for underlying pneumonia. Electronically Signed   By: Richarda OverlieAdam  Henn M.D.   On: 01/05/2019 12:06   Dg Chest Port 1 View  Result Date: 12/31/2018 CLINICAL DATA:  Dyspnea at rest EXAM: PORTABLE CHEST 1 VIEW COMPARISON:  12/09/2018 chest radiograph. FINDINGS: Stable cardiomediastinal silhouette with normal heart size. No pneumothorax. No left pleural effusion. Chronic blunting of the right costophrenic angle back to 11/26/2018. Hazy upper left lung and right lung base opacity is stable. New left retrocardiac consolidation. IMPRESSION: 1. New left retrocardiac consolidation. Stable hazy upper left lung and right lung base opacity. Findings suggest multilobar pneumonia. 2. Chronic blunting of the right costophrenic angle back to 11/26/2026 radiograph, compatible with small pleural effusion and/or pleural-parenchymal scarring. Electronically Signed   By: Delbert PhenixJason A Poff M.D.   On: 12/31/2018 15:01   Dg Chest Port 1 View  Result Date: 12/02/2018 CLINICAL DATA:  73 year old male with sepsis. EXAM: PORTABLE CHEST 1 VIEW COMPARISON:  11/26/2018 and earlier. FINDINGS: Portable AP supine view at 2057 hours. Stable large lung volumes. Stable cardiac size and mediastinal contours. Visualized tracheal air column is within normal limits. No pneumothorax, pleural effusion or consolidation. New asymmetric reticulonodular opacity in the left upper lung. No acute osseous abnormality identified. IMPRESSION: New left upper lobe reticulonodular pulmonary opacity suspicious for acute viral/atypical respiratory infection. Underlying pulmonary hyperinflation. Electronically Signed   By: Odessa FlemingH  Hall M.D.   On: 12/14/2018 22:02    [place] Ekg Site Rite  Result Date: 01/01/2019 If Site Rite image not attached, placement could not be confirmed due to current cardiac rhythm.   Microbiology: No results found for this or any previous visit (from the past 240 hour(s)).   Labs: Basic Metabolic Panel: No results for input(s): NA, K, CL, CO2, GLUCOSE, BUN, CREATININE, CALCIUM, MG, PHOS in the last 168 hours. Liver Function Tests: No results for input(s): AST, ALT, ALKPHOS, BILITOT, PROT, ALBUMIN in the last 168 hours. No results for input(s): LIPASE, AMYLASE in the last 168 hours. No results for input(s): AMMONIA in the last 168 hours. CBC: No results for input(s): WBC, NEUTROABS, HGB, HCT, MCV, PLT in the last 168 hours. Cardiac Enzymes: No results for input(s): CKTOTAL, CKMB, CKMBINDEX, TROPONINI in the last 168 hours. D-Dimer No results for input(s): DDIMER in the last 72 hours. BNP: Invalid input(s): POCBNP CBG: No results for input(s): GLUCAP in the last 168 hours. Anemia work up No results for input(s): VITAMINB12, FOLATE, FERRITIN, TIBC, IRON, RETICCTPCT in the last 72 hours. Urinalysis    Component Value Date/Time   COLORURINE AMBER (A) 12/18/2018 2036   APPEARANCEUR CLOUDY (A) 12/12/2018 2036   LABSPEC 1.020 12/23/2018 2036   PHURINE 5.0 12/03/2018 2036   GLUCOSEU >=500 (A) 12/06/2018 2036   HGBUR NEGATIVE 12/26/2018 2036   BILIRUBINUR NEGATIVE 12/01/2018 2036   KETONESUR NEGATIVE 12/16/2018 2036   PROTEINUR 100 (A) 12/18/2018 2036   NITRITE NEGATIVE 12/15/2018 2036   LEUKOCYTESUR NEGATIVE 12/13/2018 2036   Sepsis Labs Invalid input(s): PROCALCITONIN,  WBC,  LACTICIDVEN     SIGNED:  Kathlen ModyVijaya Anchor Dwan, MD  [facility] 01/22/2019, 9:33 AM   If 7PM-7AM, please contact night-coverage [email or web address] Password [email or web address]

## 2019-01-22 NOTE — Progress Notes (Signed)
All belongings taken with patients family Daughter Lucendia Herrlich Gord, including patients full top denture. Dierdre Highman, RN

## 2019-01-29 NOTE — Progress Notes (Signed)
Civil engineer, contracting [facility]) Hospital Liaison note.   Received request from [place], CSW for family interest in [facility] in [place]. Unfortunately Hospice Home continues to not have availability today. CSW is aware.   Please do not hesitate to call with questions.   Thank you,  Elsie Saas, RN, [facility]   [facility] Hospital Liaison   [phone]     [facility] Liaisons are on AMION

## 2019-01-29 NOTE — Progress Notes (Signed)
Pt passed away at 2205. Absence of heart beat and respirations confirmed by myself and Harriet Pho. Chaplin at bedside with family Provider to be notified via amion. Dierdre Highman, RN

## 2019-01-29 NOTE — Progress Notes (Signed)
PROGRESS NOTE    Gregory Stone  WUJ:[id number] DOB: 09-Jun-1946 DOA: 2018-10-20 PCP: Patient, No Pcp Per    Brief Narrative:  73 y.o.BM PMHx hyperlipidemia, diabetes mellitus type II uncontrolled with complication, end stage COPD,chronic hypercarbic hypoxic respiratory failure, chronicatrial fibrillation on Eliquis,depression, whopresented with altered mental status and shortness of breath. Per report, found to have O2 sats in 50s. He was given breathing treatments and had also been treated with a dose of Ativan then patient became minimally responsive. In ED he was barely arousable. Negative COVID test, negative flu test. WBC is 12.3, lactic acid 4.9, troponin 0.03, UA suggestive of UTI. A. fib with RVR, O2 sats 100% on 5 L. ABG showed pH of 7.18, PCO2 103, PO2 142. Chest x-ray showed left upper lobe infiltrates. CT head negative. Treated for HCAP. BIPAP dependent with severe hypercarbia with PCO2 110. Palliative care team following. On 01/12/19 decision made for comfort care only. No BIPAP per family.  Assessment & Plan:   Principal Problem:   HCAP (healthcare-associated pneumonia) Active Problems:   HLD (hyperlipidemia)   Diabetes mellitus without complication (HCC)   Depression   COPD (chronic obstructive pulmonary disease) (HCC)   Atrial fibrillation with RVR (HCC)   Acute on chronic respiratory failure with hypoxia (HCC)   Acute metabolic encephalopathy   Sepsis (HCC)   Hyperkalemia   Abnormal LFTs   Elevated troponin   COPD exacerbation (HCC)   Sepsis due to pneumonia (HCC)   Diabetes mellitus type 2, uncontrolled, with complications (HCC)   Anxiety   Acute on chronic respiratory failure with hypoxia and hypercapnia (HCC)   Palliative care encounter   End stage COPD (HCC)   Acute respiratory failure with hypercapnia (HCC)   Patient is comfort measures only.  Focus on pt's comfort. Pt agonal breathing.    DVT prophylaxis: SCD'S Code Status: DNR Family  Communication: family at bedside.  Disposition Plan: awaiting for facility. .   Consultants:   Palliative care.   Procedures:  none.  Antimicrobials: none.   Subjective: Agonal breathing.   Objective: Vitals:   01/19/19 1539 01/19/19 2102 01/19/19 2103 01/22/2019 0415  BP:  (!) 131/94  104/60  Pulse: 91 99 82 (!) 56  Resp:   16   Temp:  98.2 F (36.8 C)    TempSrc:  Oral    SpO2: 97% 97% 94% 94%  Weight:      Height:        Intake/Output Summary (Last 24 hours) at 01/10/2019 1749 Last data filed at 01/06/2019 0420 Gross per 24 hour  Intake -  Output 150 ml  Net -150 ml   Filed Weights   01/13/19 0558 01/14/19 0434 01/17/19 0622  Weight: 55.5 kg 55.5 kg 56.2 kg    Examination:  General exam: lethargic,with agonal breathing.  Respiratory system: diminished at bases,   Cardiovascular system: S1 & S2 heard, RRR.  Gastrointestinal system: Abdomen is nondistended, soft and nontender.    Data Reviewed: I have personally reviewed following labs and imaging studies  CBC: No results for input(s): WBC, NEUTROABS, HGB, HCT, MCV, PLT in the last 168 hours. Basic Metabolic Panel: No results for input(s): NA, K, CL, CO2, GLUCOSE, BUN, CREATININE, CALCIUM, MG, PHOS in the last 168 hours. GFR: Estimated Creatinine Clearance: 68.3 mL/min (A) (by C-G formula based on SCr of 0.46 mg/dL (L)). Liver Function Tests: No results for input(s): AST, ALT, ALKPHOS, BILITOT, PROT, ALBUMIN in the last 168 hours. No results for input(s): LIPASE, AMYLASE in the  last 168 hours. No results for input(s): AMMONIA in the last 168 hours. Coagulation Profile: No results for input(s): INR, PROTIME in the last 168 hours. Cardiac Enzymes: No results for input(s): CKTOTAL, CKMB, CKMBINDEX, TROPONINI in the last 168 hours. BNP (last 3 results) No results for input(s): PROBNP in the last 8760 hours. HbA1C: No results for input(s): HGBA1C in the last 72 hours. CBG: No results for input(s): GLUCAP  in the last 168 hours. Lipid Profile: No results for input(s): CHOL, HDL, LDLCALC, TRIG, CHOLHDL, LDLDIRECT in the last 72 hours. Thyroid Function Tests: No results for input(s): TSH, T4TOTAL, FREET4, T3FREE, THYROIDAB in the last 72 hours. Anemia Panel: No results for input(s): VITAMINB12, FOLATE, FERRITIN, TIBC, IRON, RETICCTPCT in the last 72 hours. Sepsis Labs: No results for input(s): PROCALCITON, LATICACIDVEN in the last 168 hours.  No results found for this or any previous visit (from the past 240 hour(s)).       Radiology Studies: No results found.      Scheduled Meds:  Continuous Infusions:   LOS: 26 days    Time spent: 15 minutes    Kathlen Mody, MD [facility] Pager [phone] If 7PM-7AM, please contact night-coverage [email or web address] Password [facility] 01/12/2019, 5:49 PM

## 2019-01-29 NOTE — Progress Notes (Signed)
Chaplain was called by Nurse this evening to bedside. Family requested chaplain visit due to patient actively dying and in need of spiritual support. Chaplain arrived on unit and spoke with Nurse Margo Aye. She stated that patient had passed. Chaplain entered patient room and found patient daughter, Gregory Stone, bedside. Chaplain provided spiritual presence, prayer, and opportunity for life review. Daughter was grateful for support. Chaplain followed up with Nurse Margo Aye after visit. Will remain available as needed or requested.

## 2019-01-29 DEATH — deceased

## 2020-11-06 IMAGING — CT CT HEAD WITHOUT CONTRAST
4 series · 17 of 47 positions shown, 19 images · non-contrast
Comparison: None.

CLINICAL DATA: 72-year-old male with ataxia, encephalopathy, atrial
fibrillation.

EXAM:
CT HEAD WITHOUT CONTRAST
TECHNIQUE: Contiguous axial images were obtained from the base of the skull
through the vertex without intravenous contrast.

[Series 3: head without · axial · non-contrast · 0.39mm/px · z∈[+1281,+1406]mm · 7 of 35 slices shown, 9 images]
[im 5/35  brain]
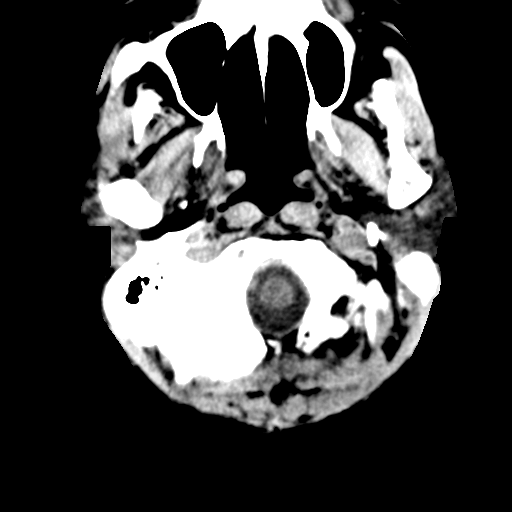
[im 5/35  bone]
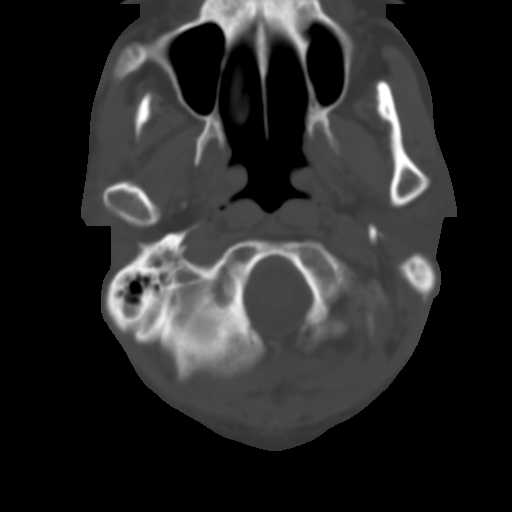
[im 9/35  brain]
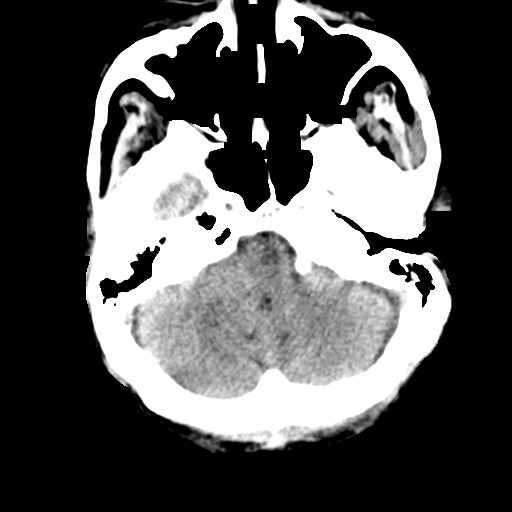
[im 13/35  brain]
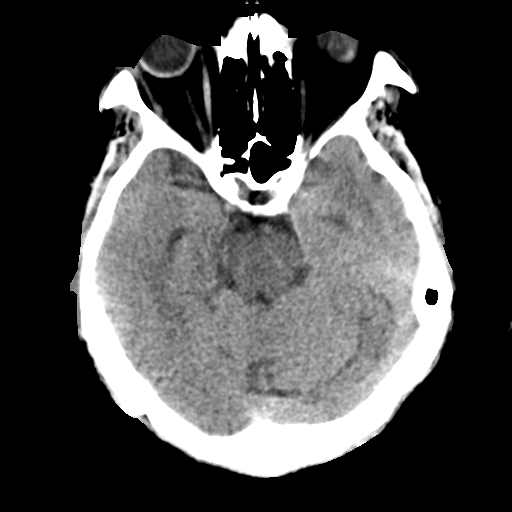
[im 18/35  brain]
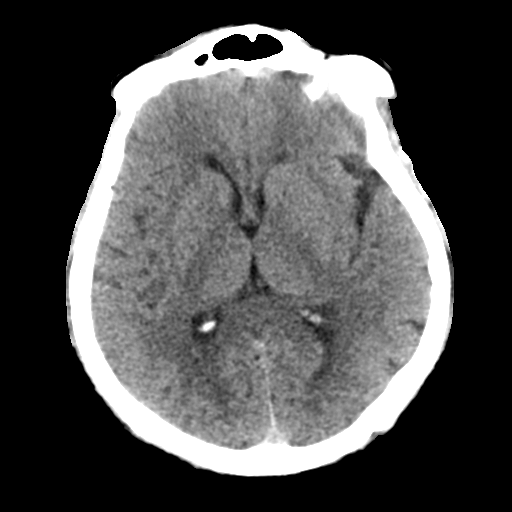
[im 22/35  brain]
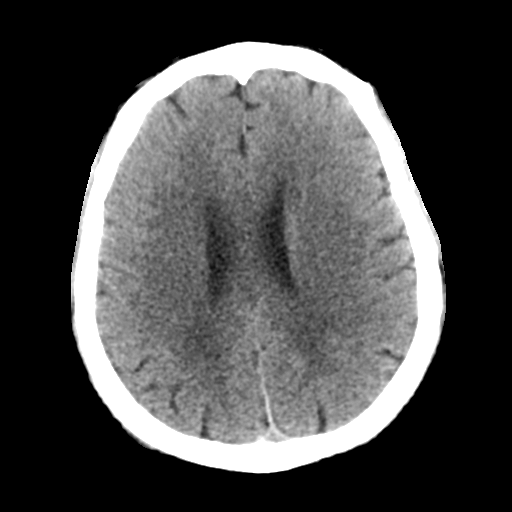
[im 22/35  bone]
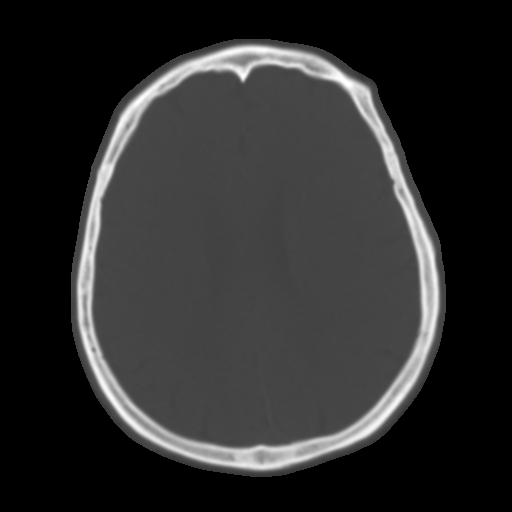
[im 26/35  brain]
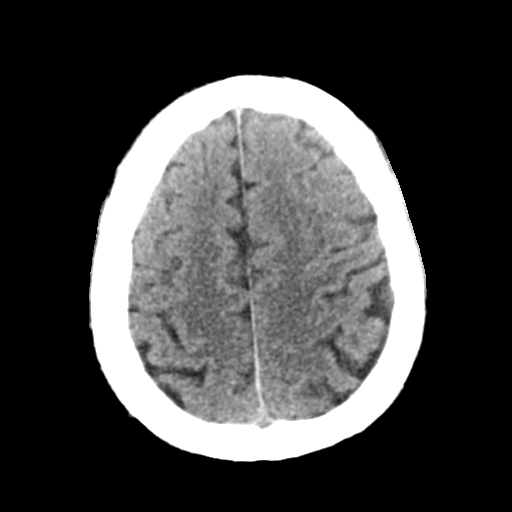
[im 30/35  brain]
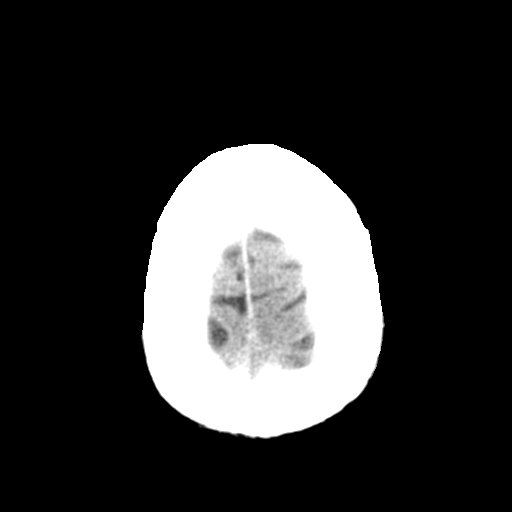

[Series 4: head bone · axial · 0.39mm/px · z∈[+1277,+1337]mm · 4 of 87 slices shown]
[im 9/87  bone]
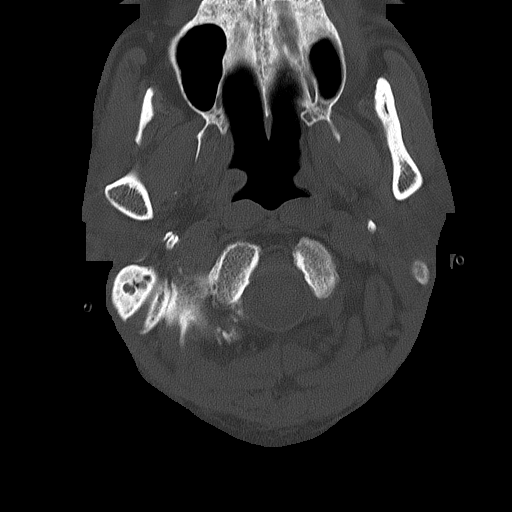
[im 18/87  bone]
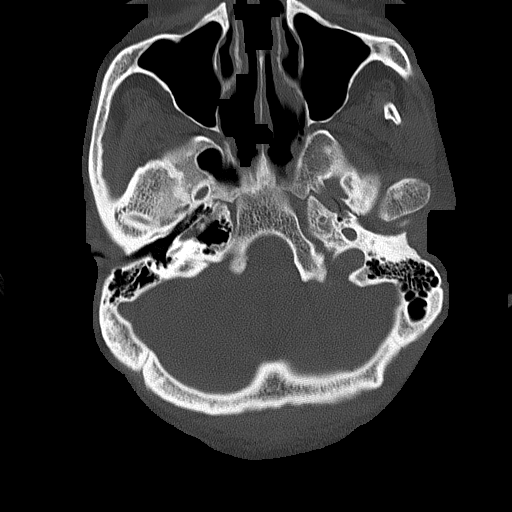
[im 26/87  bone]
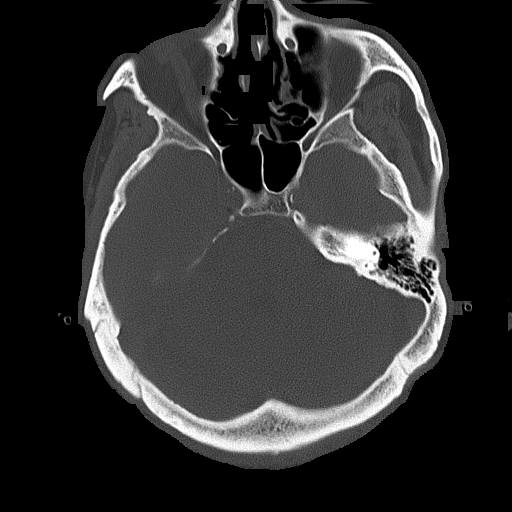
[im 39/87  bone]
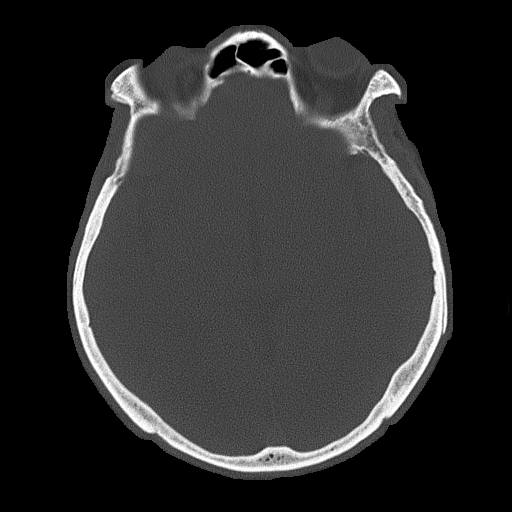

[Series 5: head without cor · coronal · non-contrast · 0.35mm/px · 3 of 68 slices shown]
[im 23/68  brain]
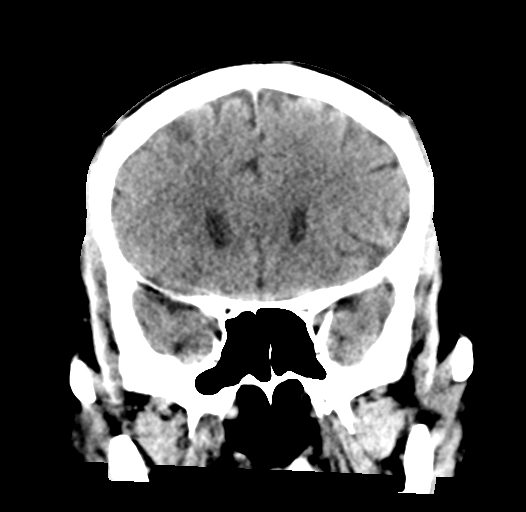
[im 30/68  brain]
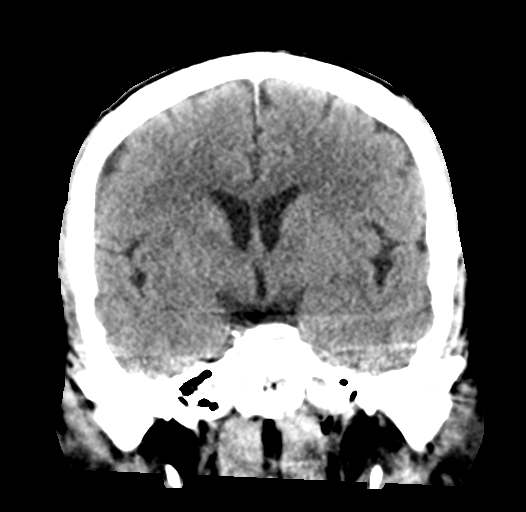
[im 38/68  brain]
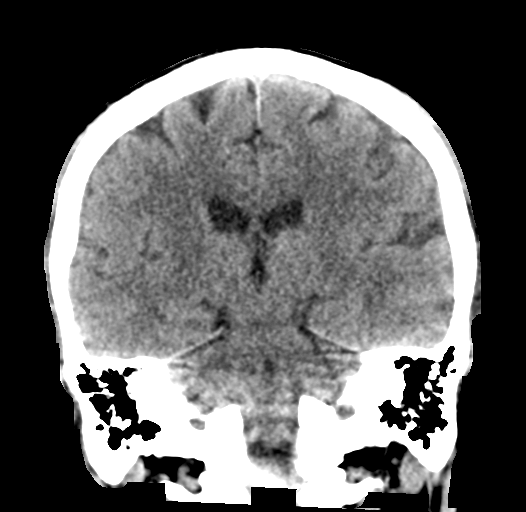

[Series 6: head without sag · sagittal · non-contrast · 0.34mm/px · 3 of 57 slices shown]
[im 19/57  brain]
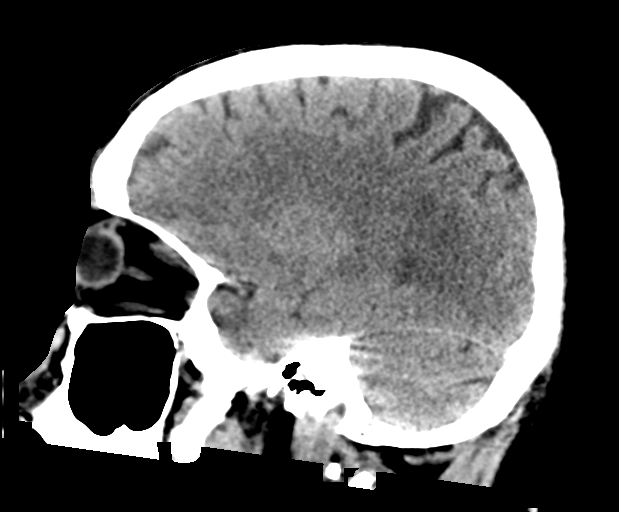
[im 29/57  brain]
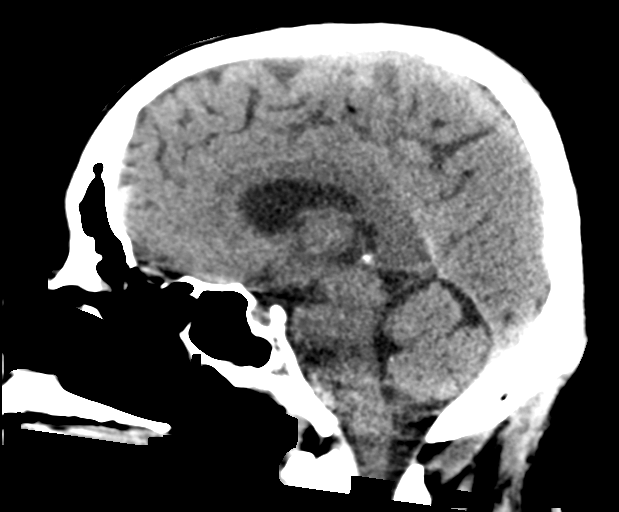
[im 38/57  brain]
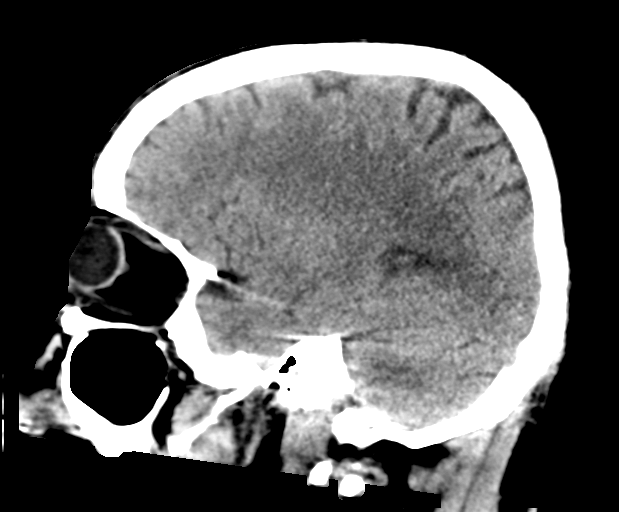

[17 of 47 positions shown; findings below may reference images not displayed]

FINDINGS: Brain: Cerebral volume is within normal limits for age. No midline
shift, ventriculomegaly, mass effect, evidence of mass lesion,
intracranial hemorrhage or evidence of cortically based acute
infarction.

Patchy periatrial white matter hypodensity. No cortical
encephalomalacia.

Vascular: Calcified atherosclerosis at the skull base. No suspicious
intracranial vascular hyperdensity.

Skull: Negative.

Sinuses/Orbits: Visualized paranasal sinuses and mastoids are well
pneumatized.

Other: Negative orbits. Visualized scalp soft tissues are within
normal limits.
IMPRESSION: 1. No acute intracranial abnormality.
2. Mild for age nonspecific cerebral white matter changes, most
commonly due to small vessel disease.
